# Patient Record
Sex: Female | Born: 1935 | Race: Black or African American | Hispanic: No | Smoking: Former smoker
Health system: Southern US, Community
[De-identification: ages and names within clinical notes are randomized; demographics above are authoritative.]

## PROBLEM LIST (undated history)

## (undated) DIAGNOSIS — I4891 Unspecified atrial fibrillation: Secondary | ICD-10-CM

## (undated) DIAGNOSIS — I10 Essential (primary) hypertension: Secondary | ICD-10-CM

## (undated) DIAGNOSIS — I2510 Atherosclerotic heart disease of native coronary artery without angina pectoris: Secondary | ICD-10-CM

## (undated) HISTORY — PX: TONSILLECTOMY: SUR1361

## (undated) HISTORY — PX: PERCUTANEOUS PLACEMENT INTRAVASCULAR STENT CERVICAL CAROTID ARTERY: SUR1019

## (undated) HISTORY — PX: ABDOMINAL HYSTERECTOMY: SHX81

## (undated) HISTORY — PX: JOINT REPLACEMENT: SHX530

## (undated) HISTORY — PX: HAND TENDON SURGERY: SHX663

---

## 2015-12-06 ENCOUNTER — Emergency Department (HOSPITAL_BASED_OUTPATIENT_CLINIC_OR_DEPARTMENT_OTHER): Payer: Medicare HMO

## 2015-12-06 ENCOUNTER — Emergency Department (HOSPITAL_BASED_OUTPATIENT_CLINIC_OR_DEPARTMENT_OTHER)
Admission: EM | Admit: 2015-12-06 | Discharge: 2015-12-06 | Disposition: A | Discharge status: Other Acute Inpatient Hospital | Payer: Medicare HMO | Attending: Emergency Medicine | Admitting: Emergency Medicine

## 2015-12-06 ENCOUNTER — Encounter (HOSPITAL_BASED_OUTPATIENT_CLINIC_OR_DEPARTMENT_OTHER): Payer: Self-pay | Admitting: *Deleted

## 2015-12-06 DIAGNOSIS — Y998 Other external cause status: Secondary | ICD-10-CM | POA: Diagnosis not present

## 2015-12-06 DIAGNOSIS — K625 Hemorrhage of anus and rectum: Secondary | ICD-10-CM

## 2015-12-06 DIAGNOSIS — I10 Essential (primary) hypertension: Secondary | ICD-10-CM | POA: Insufficient documentation

## 2015-12-06 DIAGNOSIS — Y9389 Activity, other specified: Secondary | ICD-10-CM | POA: Diagnosis not present

## 2015-12-06 DIAGNOSIS — R195 Other fecal abnormalities: Secondary | ICD-10-CM | POA: Diagnosis not present

## 2015-12-06 DIAGNOSIS — Z79899 Other long term (current) drug therapy: Secondary | ICD-10-CM | POA: Insufficient documentation

## 2015-12-06 DIAGNOSIS — S6991XA Unspecified injury of right wrist, hand and finger(s), initial encounter: Secondary | ICD-10-CM | POA: Insufficient documentation

## 2015-12-06 DIAGNOSIS — Y92007 Garden or yard of unspecified non-institutional (private) residence as the place of occurrence of the external cause: Secondary | ICD-10-CM | POA: Diagnosis not present

## 2015-12-06 DIAGNOSIS — Z791 Long term (current) use of non-steroidal anti-inflammatories (NSAID): Secondary | ICD-10-CM | POA: Insufficient documentation

## 2015-12-06 DIAGNOSIS — W1839XA Other fall on same level, initial encounter: Secondary | ICD-10-CM | POA: Insufficient documentation

## 2015-12-06 HISTORY — DX: Essential (primary) hypertension: I10

## 2015-12-06 LAB — COMPREHENSIVE METABOLIC PANEL
ALBUMIN: 4.4 g/dL (ref 3.5–5.0)
ALT: 25 U/L (ref 14–54)
ANION GAP: 7 (ref 5–15)
AST: 30 U/L (ref 15–41)
Alkaline Phosphatase: 81 U/L (ref 38–126)
BUN: 18 mg/dL (ref 6–20)
CO2: 23 mmol/L (ref 22–32)
Calcium: 9 mg/dL (ref 8.9–10.3)
Chloride: 111 mmol/L (ref 101–111)
Creatinine, Ser: 0.95 mg/dL (ref 0.44–1.00)
GFR calc Af Amer: >60 mL/min (ref >60)
GFR calc non Af Amer: 55 mL/min — ABNORMAL LOW (ref >60)
GLUCOSE: 91 mg/dL (ref 65–99)
POTASSIUM: 4.1 mmol/L (ref 3.5–5.1)
SODIUM: 141 mmol/L (ref 135–145)
Total Bilirubin: 0.6 mg/dL (ref 0.3–1.2)
Total Protein: 7.1 g/dL (ref 6.5–8.1)

## 2015-12-06 LAB — CBC WITH DIFFERENTIAL/PLATELET
BASOS ABS: 0 10*3/uL (ref 0.0–0.1)
BASOS PCT: 0 %
EOS ABS: 0.1 10*3/uL (ref 0.0–0.7)
Eosinophils Relative: 2 %
HCT: 37.4 % (ref 36.0–46.0)
HEMOGLOBIN: 11.9 g/dL — AB (ref 12.0–15.0)
Lymphocytes Relative: 13 %
Lymphs Abs: 0.9 10*3/uL (ref 0.7–4.0)
MCH: 27.6 pg (ref 26.0–34.0)
MCHC: 31.8 g/dL (ref 30.0–36.0)
MCV: 86.8 fL (ref 78.0–100.0)
MONO ABS: 0.6 10*3/uL (ref 0.1–1.0)
MONOS PCT: 9 %
NEUTROS PCT: 76 %
Neutro Abs: 4.9 10*3/uL (ref 1.7–7.7)
Platelets: 195 10*3/uL (ref 150–400)
RBC: 4.31 MIL/uL (ref 3.87–5.11)
RDW: 15.3 % (ref 11.5–15.5)
WBC: 6.5 10*3/uL (ref 4.0–10.5)

## 2015-12-06 LAB — PROTIME-INR
INR: 1.22 (ref 0.00–1.49)
Prothrombin Time: 15.6 seconds — ABNORMAL HIGH (ref 11.6–15.2)

## 2015-12-06 MED ORDER — PANTOPRAZOLE SODIUM 40 MG IV SOLR
40.0000 mg | Freq: Once | INTRAVENOUS | Status: AC
  Administered 2015-12-06: 40 mg via INTRAVENOUS
  Filled 2015-12-06: qty 40

## 2015-12-06 MED ORDER — SODIUM CHLORIDE 0.9 % IV SOLN
20.0000 mL | INTRAVENOUS | Status: DC
  Administered 2015-12-06: 20 mL via INTRAVENOUS

## 2015-12-06 NOTE — ED Notes (Signed)
Pt. Reports she fell on Tuesday causing injury to the R hand.  Noted swelling to the R hand and R wrist.  Pt. Reports she saw her PMD and was told she may have a Fx.  Was told she needs to follow up with a Ortho and has not been referred.  Pt. Here today due to family told her to come and she also took  NSAID and "passed blood" Pt. Is on blood thinner Plavix for approx. 10 yrs. For carotid artery disease.

## 2015-12-06 NOTE — ED Notes (Signed)
Dr. Jeanell Sparrow did rectal check and on Pt. RN Rosana Hoes at bedside..noted frank blood on Dr. Jeanell Sparrow glove.  Pt. Did report she had much blood with a BM this morning.

## 2015-12-06 NOTE — ED Provider Notes (Signed)
CSN: [id number]     Arrival date & time 12/06/15  1453 History   First MD Initiated Contact with Patient 12/06/15 1514     Chief Complaint  Patient presents with  . Hand Injury     (Consider location/radiation/quality/duration/timing/severity/associated sxs/prior Treatment) HPI This is an 81 year old female comes in today complaining of right wrist pain. She fell 3 days ago while she was outside working in the yard. She had some pain and swelling at the wrist and was seen by her primary care physician in next day. A wrist x-Maple Odaniel was obtained. She was called back the next day and told that it might be broken and they will call her with a referral to hand surgeon. She states that she not get a call back from them and came in today at the urging of family. She is having some pain and swelling in the area. She denies any numbness or tingling to the fingers or hand. She is able to use the hand and it is her dominant hand.  She also states that she has noted bright red blood in her stool with each bowel movement 2 times during the past 3 days. She states that today it was chiefly blood one time. She has not noted any lightheadedness, chest pain, or dyspnea. She has no previous history of rectal bleeding. She is on Plavix. She took nonsteroidals for the pain in her hand. Past Medical History  Diagnosis Date  . Hypertension    Past Surgical History  Procedure Laterality Date  . Percutaneous placement intravascular stent cervical carotid artery    . Joint replacement      R hip x 2  . Abdominal hysterectomy    . Tonsillectomy    . Hand tendon surgery     No family history on file. Social History  Substance Use Topics  . Smoking status: Not on file  . Smokeless tobacco: Not on file  . Alcohol Use: Not on file   OB History    No data available     Review of Systems  All other systems reviewed and are negative.     Allergies  Review of patient's allergies indicates no known  allergies.  Home Medications   Prior to Admission medications   Medication Sig Start Date End Date Taking? Authorizing Provider  atorvastatin (LIPITOR) 40 MG tablet Take 40 mg by mouth daily.   Yes Historical Provider, MD  bimatoprost (LATISSE) 0.03 % ophthalmic solution Place into both eyes at bedtime. Place one drop on applicator and apply evenly along the skin of the upper eyelid at base of eyelashes once daily at bedtime; repeat procedure for second eye (use a clean applicator).   Yes Historical Provider, MD  calcium-vitamin D (OSCAL WITH D) 500-200 MG-UNIT tablet Take 1 tablet by mouth.   Yes Historical Provider, MD  clopidogrel (PLAVIX) 75 MG tablet Take 75 mg by mouth daily.   Yes Historical Provider, MD  ezetimibe (ZETIA) 10 MG tablet Take 10 mg by mouth daily.   Yes Historical Provider, MD  ferrous sulfate 325 (65 FE) MG tablet Take 325 mg by mouth daily with breakfast.   Yes Historical Provider, MD  folic acid (FOLVITE) 1 MG tablet Take 1 mg by mouth daily.   Yes Historical Provider, MD  lisinopril (PRINIVIL,ZESTRIL) 20 MG tablet Take 20 mg by mouth daily.   Yes Historical Provider, MD  omega-3 acid ethyl esters (LOVAZA) 1 g capsule Take by mouth 2 (two) times daily.  Yes Historical Provider, MD  risedronate (ACTONEL) 35 MG tablet Take 35 mg by mouth every 7 (seven) days. with water on empty stomach, nothing by mouth or lie down for next 30 minutes.   Yes Historical Provider, MD  topiramate (TOPAMAX) 50 MG tablet Take 50 mg by mouth 2 (two) times daily.   Yes Historical Provider, MD   BP 190/94 mmHg  Pulse 81  Temp(Src) 98.2 F (36.8 C) (Oral)  Resp 18  Ht 5\' 8"  (1.727 m)  Wt 60.782 kg  BMI 20.38 kg/m2  SpO2 98% Physical Exam  Constitutional: She is oriented to person, place, and time. She appears well-developed and well-nourished.  HENT:  Head: Normocephalic and atraumatic.  Right Ear: External ear normal.  Left Ear: External ear normal.  Nose: Nose normal.    Mouth/Throat: Oropharynx is clear and moist.  Eyes: Conjunctivae and EOM are normal. Pupils are equal, round, and reactive to light.  Neck: Normal range of motion. Neck supple.  Cardiovascular: Normal rate, regular rhythm, normal heart sounds and intact distal pulses.   Pulmonary/Chest: Effort normal and breath sounds normal.  Abdominal: Soft. Bowel sounds are normal.  Genitourinary: Guaiac positive stool.  Digital rectal exam reveals maroon stool  Musculoskeletal: She exhibits tenderness.       Arms: Tenderness over distal right radius. Skin is intact. 2 point sensation in fingers is intact. Movement is intact in all her fingers, wrist, and elbow.  Neurological: She is alert and oriented to person, place, and time.  Skin: Skin is warm and dry.  Psychiatric: She has a normal mood and affect.  Nursing note and vitals reviewed.   ED Course  Procedures (including critical care time) Labs Review Labs Reviewed  CBC WITH DIFFERENTIAL/PLATELET - Abnormal; Notable for the following:    Hemoglobin 11.9 (*)    All other components within normal limits  COMPREHENSIVE METABOLIC PANEL - Abnormal; Notable for the following:    GFR calc non Af Amer 55 (*)    All other components within normal limits  PROTIME-INR - Abnormal; Notable for the following:    Prothrombin Time 15.6 (*)    All other components within normal limits    Imaging Review Dg Chest 2 View  12/06/2015  CLINICAL DATA:  Rectal bleeding for day. EXAM: CHEST  2 VIEW COMPARISON:  None. FINDINGS: Heart is enlarged, mild to moderate in degree. Lungs are hyperexpanded suggesting COPD. Lungs are clear. No evidence of pneumonia. No pleural effusion. No pneumothorax. Osseous structures about the chest are unremarkable. IMPRESSION: Cardiomegaly. Lungs are hyperexpanded suggesting COPD. No evidence of acute cardiopulmonary abnormality. Electronically Signed   By: Franki Cabot M.D.   On: 12/06/2015 16:29   Dg Wrist Complete  Right  12/06/2015  CLINICAL DATA:  Fall 3 days ago, right wrist pain. EXAM: RIGHT WRIST - COMPLETE 3+ VIEW COMPARISON:  None. FINDINGS: Osseous structures are diffusely osteopenic which limits characterization of osseous detail. Subtle sclerotic focus within the distal right radius, distal metaphysis, is suspicious for nondisplaced fracture. There is neutral relationship at the radiocarpal joint space. Mild degenerative changes seen amongst the carpal bones and at the first metacarpophalangeal joint space. IMPRESSION: 1. Vague linear sclerotic focus within the distal right radial metaphysis suspicious for nondisplaced fracture, but not convincing. Alternatively, this could represent sclerosis related to old fracture or old bone infarct. 2.  No displaced fracture identified. 3.  Osteopenia. Electronically Signed   By: Franki Cabot M.D.   On: 12/06/2015 16:34   I have personally  reviewed and evaluated these images and lab results as part of my medical decision-making.   EKG Interpretation   Date/Time:  Friday December 06 2015 15:42:34 EST Ventricular Rate:  77 PR Interval:  129 QRS Duration: 82 QT Interval:  403 QTC Calculation: 456 R Axis:   80 Text Interpretation:  Sinus rhythm Probable left atrial enlargement LVH  with secondary repolarization abnormality Confirmed by Alycen Mack MD, Andee Poles  [id number]) on 12/06/2015 4:04:17 PM      MDM   Final diagnoses:  Rectal bleeding      1 rectal bleeding patient with gross blood on rectal exam. She has received Protonix here. Hemoglobin is 11.9. She is hemodynamically stable 2 distal right radius fracture patient is placed in Velcro splint. Plan transfer [facility]. Patient's care discussed with Dr. Verl Blalock. Pattricia Boss, MD 12/06/15 [phone]

## 2020-07-25 ENCOUNTER — Other Ambulatory Visit: Payer: Self-pay

## 2020-07-25 ENCOUNTER — Emergency Department (HOSPITAL_BASED_OUTPATIENT_CLINIC_OR_DEPARTMENT_OTHER)
Admission: EM | Admit: 2020-07-25 | Discharge: 2020-07-25 | Disposition: A | Discharge status: Home / Self Care | Payer: Medicare HMO | Source: Home / Self Care | Attending: Emergency Medicine | Admitting: Emergency Medicine

## 2020-07-25 ENCOUNTER — Emergency Department (HOSPITAL_BASED_OUTPATIENT_CLINIC_OR_DEPARTMENT_OTHER): Payer: Medicare HMO

## 2020-07-25 ENCOUNTER — Encounter (HOSPITAL_BASED_OUTPATIENT_CLINIC_OR_DEPARTMENT_OTHER): Payer: Self-pay | Admitting: *Deleted

## 2020-07-25 DIAGNOSIS — Z79899 Other long term (current) drug therapy: Secondary | ICD-10-CM | POA: Insufficient documentation

## 2020-07-25 DIAGNOSIS — I10 Essential (primary) hypertension: Secondary | ICD-10-CM | POA: Insufficient documentation

## 2020-07-25 DIAGNOSIS — Z96651 Presence of right artificial knee joint: Secondary | ICD-10-CM | POA: Insufficient documentation

## 2020-07-25 DIAGNOSIS — Z20822 Contact with and (suspected) exposure to covid-19: Secondary | ICD-10-CM | POA: Insufficient documentation

## 2020-07-25 DIAGNOSIS — J9601 Acute respiratory failure with hypoxia: Secondary | ICD-10-CM | POA: Diagnosis not present

## 2020-07-25 DIAGNOSIS — R079 Chest pain, unspecified: Secondary | ICD-10-CM | POA: Insufficient documentation

## 2020-07-25 DIAGNOSIS — R0602 Shortness of breath: Secondary | ICD-10-CM | POA: Insufficient documentation

## 2020-07-25 DIAGNOSIS — Z87891 Personal history of nicotine dependence: Secondary | ICD-10-CM | POA: Insufficient documentation

## 2020-07-25 LAB — CBC
HCT: 33.6 % — ABNORMAL LOW (ref 36.0–46.0)
Hemoglobin: 10.7 g/dL — ABNORMAL LOW (ref 12.0–15.0)
MCH: 28.2 pg (ref 26.0–34.0)
MCHC: 31.8 g/dL (ref 30.0–36.0)
MCV: 88.7 fL (ref 80.0–100.0)
Platelets: 231 10*3/uL (ref 150–400)
RBC: 3.79 MIL/uL — ABNORMAL LOW (ref 3.87–5.11)
RDW: 16.4 % — ABNORMAL HIGH (ref 11.5–15.5)
WBC: 6.4 10*3/uL (ref 4.0–10.5)
nRBC: 0.3 % — ABNORMAL HIGH (ref 0.0–0.2)

## 2020-07-25 LAB — HEPATIC FUNCTION PANEL
ALT: 44 U/L (ref 0–44)
AST: 53 U/L — ABNORMAL HIGH (ref 15–41)
Albumin: 4 g/dL (ref 3.5–5.0)
Alkaline Phosphatase: 126 U/L (ref 38–126)
Bilirubin, Direct: 0.4 mg/dL — ABNORMAL HIGH (ref 0.0–0.2)
Indirect Bilirubin: 0.7 mg/dL (ref 0.3–0.9)
Total Bilirubin: 1.1 mg/dL (ref 0.3–1.2)
Total Protein: 6.9 g/dL (ref 6.5–8.1)

## 2020-07-25 LAB — BASIC METABOLIC PANEL
Anion gap: 14 (ref 5–15)
BUN: 42 mg/dL — ABNORMAL HIGH (ref 8–23)
CO2: 26 mmol/L (ref 22–32)
Calcium: 9.7 mg/dL (ref 8.9–10.3)
Chloride: 100 mmol/L (ref 98–111)
Creatinine, Ser: 1.64 mg/dL — ABNORMAL HIGH (ref 0.44–1.00)
GFR calc Af Amer: 33 mL/min — ABNORMAL LOW (ref >60)
GFR calc non Af Amer: 28 mL/min — ABNORMAL LOW (ref >60)
Glucose, Bld: 122 mg/dL — ABNORMAL HIGH (ref 70–99)
Potassium: 5.2 mmol/L — ABNORMAL HIGH (ref 3.5–5.1)
Sodium: 140 mmol/L (ref 135–145)

## 2020-07-25 LAB — BRAIN NATRIURETIC PEPTIDE: B Natriuretic Peptide: 2255.6 pg/mL — ABNORMAL HIGH (ref 0.0–100.0)

## 2020-07-25 LAB — SARS CORONAVIRUS 2 BY RT PCR (HOSPITAL ORDER, PERFORMED IN ~~LOC~~ HOSPITAL LAB): SARS Coronavirus 2: NEGATIVE

## 2020-07-25 LAB — TROPONIN I (HIGH SENSITIVITY)
Troponin I (High Sensitivity): 29 ng/L — ABNORMAL HIGH (ref <18)
Troponin I (High Sensitivity): 26 ng/L — ABNORMAL HIGH (ref <18)

## 2020-07-25 MED ORDER — FUROSEMIDE 10 MG/ML IJ SOLN
20.0000 mg | Freq: Once | INTRAMUSCULAR | Status: AC
  Administered 2020-07-25: 20 mg via INTRAVENOUS
  Filled 2020-07-25: qty 2

## 2020-07-25 NOTE — ED Provider Notes (Signed)
Blood pressure 103/79, pulse 99, temperature 97.6 F (36.4 C), temperature source Oral, resp. rate (!) 25, height 5\' 8"  (1.727 m), weight 48.5 kg, SpO2 97 %.  Assuming care from Dr. Rogene Houston.  In short, Shawna Curry is a 86 y.o. female with a chief complaint of Shortness of Breath and Chest Pain .  Refer to the original H&P for additional details.  The current plan of care is to f/u on CT chest and reassess after repeat troponin. Patient with minimal O2 requirement and pulse ox picking up poorly.   04:30 PM  Patient CT scan shows small pleural effusion, question of atelectasis, no significant pulmonary edema on CT.  I suspect that the area of atelectasis is that and not a developing infiltrate.  Patient has no pneumonia symptoms at this time.  Had discussion with the patient and son at bedside.  The patient is feeling well and would prefer to go home and follow with her cardiologist on Monday as scheduled.  Will give a small extra dose of Lasix here and ambulate to see if the oxygen is actually required or can be removed.   05:00 PM  Patient ambulated in the emergency department with her walker without supplemental oxygen.  No significant desaturation, increased work of breathing.  Subjectively the patient is feeling well.  We discussed her options and she would prefer to try management at home.  She has follow-up with her cardiologist on Monday.  We discussed strict ED return precautions with both the patient and son at bedside.     Margette Fast, MD 07/25/20 [phone]

## 2020-07-25 NOTE — ED Notes (Signed)
DR Z into speak to pt and son about chest xray

## 2020-07-25 NOTE — ED Notes (Signed)
Crystal RT did EKG

## 2020-07-25 NOTE — ED Notes (Signed)
Sob and weakness for a while  Since  April  . Started to feel  Worse about 11 am ,  Saw her cards dr on tuesday

## 2020-07-25 NOTE — Discharge Instructions (Signed)
You were seen in the emergency department today with shortness of breath symptoms.  Your lab work showed some elevation in your fluid levels but your CT scan did not show significant fluid in your lungs.  Please continue your Lasix.  We gave you an extra dose here but she should continue your normal dose of Lasix over the weekend.  Please keep your appointment on Monday with your cardiologist.  If your symptoms change or suddenly worsen you should return to the emergency department or call 911 if severe.

## 2020-07-25 NOTE — ED Triage Notes (Signed)
Sob and pain in her right chest this am. States she was admitted for same recently.

## 2020-07-25 NOTE — ED Notes (Signed)
Patient transported to CT 

## 2020-07-25 NOTE — ED Notes (Signed)
Ambulated pt with pulse ox, started at 100%, started walking dropped to 95%.  He had frequent times where her pulse oximeter would not read, but when it came back on, got down to 93-94% for a couple of seconds when returning to room, then increased to 98-100% upon sitting on bed. Pt. "Felt good walking".

## 2020-07-25 NOTE — ED Provider Notes (Signed)
[facility] EMERGENCY DEPARTMENT Provider Note   CSN: [id number] Arrival date & time: 07/25/20  1202     History Chief Complaint  Patient presents with  . Shortness of Breath  . Chest Pain    Shawna Curry is a 86 y.o. female.  Patient followed by [facility] cardiology group. Known to have a history of coronary disease with prior stent atrial fibrillation. But not on Plavix and Eliquis. Patient known to have heart failure but preserved ventricular function according to their notes. History of shortness of breath chronically and hyperlipidemia. She is on Lasix 40 mg a day. She did take it this morning. Patient was seen by the cardiology group on August 17 just 2 days ago. And also with a complaint here today of some shortness of breath and some chest pain which seems somewhat vague. Currently not there now.        Past Medical History:  Diagnosis Date  . Hypertension     There are no problems to display for this patient.   Past Surgical History:  Procedure Laterality Date  . ABDOMINAL HYSTERECTOMY    . HAND TENDON SURGERY    . JOINT REPLACEMENT     R hip x 2  . PERCUTANEOUS PLACEMENT INTRAVASCULAR STENT CERVICAL CAROTID ARTERY    . TONSILLECTOMY       OB History   No obstetric history on file.     No family history on file.  Social History   Tobacco Use  . Smoking status: Former Research scientist (life sciences)  . Smokeless tobacco: Never Used  Substance Use Topics  . Alcohol use: Never  . Drug use: Never    Home Medications Prior to Admission medications   Medication Sig Start Date End Date Taking? Authorizing Provider  atorvastatin (LIPITOR) 40 MG tablet Take 40 mg by mouth daily.    [provider]  bimatoprost (LATISSE) 0.03 % ophthalmic solution Place into both eyes at bedtime. Place one drop on applicator and apply evenly along the skin of the upper eyelid at base of eyelashes once daily at bedtime; repeat procedure for second eye (use a clean applicator).     [provider]  calcium-vitamin D (OSCAL WITH D) 500-200 MG-UNIT tablet Take 1 tablet by mouth.    [provider]  clopidogrel (PLAVIX) 75 MG tablet Take 75 mg by mouth daily.    [provider]  ezetimibe (ZETIA) 10 MG tablet Take 10 mg by mouth daily.    [provider]  ferrous sulfate 325 (65 FE) MG tablet Take 325 mg by mouth daily with breakfast.    [provider]  folic acid (FOLVITE) 1 MG tablet Take 1 mg by mouth daily.    [provider]  lisinopril (PRINIVIL,ZESTRIL) 20 MG tablet Take 20 mg by mouth daily.    [provider]  omega-3 acid ethyl esters (LOVAZA) 1 g capsule Take by mouth 2 (two) times daily.    [provider]  risedronate (ACTONEL) 35 MG tablet Take 35 mg by mouth every 7 (seven) days. with water on empty stomach, nothing by mouth or lie down for next 30 minutes.    [provider]  topiramate (TOPAMAX) 50 MG tablet Take 50 mg by mouth 2 (two) times daily.    [provider]    Allergies    Patient has no known allergies.  Review of Systems   Review of Systems  Constitutional: Negative for chills and fever.  HENT: Negative for congestion,  rhinorrhea and sore throat.   Eyes: Negative for visual disturbance.  Respiratory: Positive for shortness of breath. Negative for cough.   Cardiovascular: Positive for chest pain. Negative for leg swelling.  Gastrointestinal: Negative for abdominal pain, diarrhea, nausea and vomiting.  Genitourinary: Negative for dysuria.  Musculoskeletal: Negative for back pain and neck pain.  Skin: Negative for rash.  Neurological: Negative for dizziness, light-headedness and headaches.  Hematological: Does not bruise/bleed easily.  Psychiatric/Behavioral: Negative for confusion.    Physical Exam Updated Vital Signs BP 107/88 (BP Location: Right Arm)   Pulse 86   Temp 97.6 F (36.4 C) (Oral)   Resp (!) 23   Ht 1.727 m (5\' 8" )   Wt 48.5  kg   SpO2 99%   BMI 16.27 kg/m   Physical Exam Vitals and nursing note reviewed.  Constitutional:      General: She is not in acute distress.    Appearance: Normal appearance. She is well-developed. She is not toxic-appearing or diaphoretic.     Comments: Patient is very thin.  HENT:     Head: Normocephalic and atraumatic.  Eyes:     Conjunctiva/sclera: Conjunctivae normal.     Pupils: Pupils are equal, round, and reactive to light.  Cardiovascular:     Rate and Rhythm: Normal rate and regular rhythm.     Heart sounds: No murmur heard.   Pulmonary:     Effort: Pulmonary effort is normal. No respiratory distress.     Breath sounds: Normal breath sounds.  Abdominal:     Palpations: Abdomen is soft.     Tenderness: There is no abdominal tenderness.  Musculoskeletal:        General: Swelling present.     Cervical back: Neck supple.     Comments: Very trace edema to lower extremities.  Skin:    General: Skin is warm and dry.     Capillary Refill: Capillary refill takes less than 2 seconds.  Neurological:     General: No focal deficit present.     Mental Status: She is alert. Mental status is at baseline.     Cranial Nerves: No cranial nerve deficit.     Sensory: No sensory deficit.     Motor: No weakness.     ED Results / Procedures / Treatments   Labs (all labs ordered are listed, but only abnormal results are displayed) Labs Reviewed  BASIC METABOLIC PANEL - Abnormal; Notable for the following components:      Result Value   Potassium 5.2 (*)    Glucose, Bld 122 (*)    BUN 42 (*)    Creatinine, Ser 1.64 (*)    GFR calc non Af Amer 28 (*)    GFR calc Af Amer 33 (*)    All other components within normal limits  CBC - Abnormal; Notable for the following components:   RBC 3.79 (*)    Hemoglobin 10.7 (*)    HCT 33.6 (*)    RDW 16.4 (*)    nRBC 0.3 (*)    All other components within normal limits  HEPATIC FUNCTION PANEL - Abnormal; Notable for the following  components:   AST 53 (*)    Bilirubin, Direct 0.4 (*)    All other components within normal limits  BRAIN NATRIURETIC PEPTIDE - Abnormal; Notable for the following components:   B Natriuretic Peptide 2,255.6 (*)    All other components within normal limits  TROPONIN I (HIGH SENSITIVITY) - Abnormal; Notable for the following components:  Troponin I (High Sensitivity) 29 (*)    All other components within normal limits  SARS CORONAVIRUS 2 BY RT PCR [facility] ORDER, [place] [facility])  TROPONIN I (HIGH SENSITIVITY)    EKG EKG Interpretation  Date/Time:  Thursday July 25 2020 12:51:39 EDT Ventricular Rate:  102 PR Interval:    QRS Duration: 88 QT Interval:  396 QTC Calculation: 477 R Axis:   94 Text Interpretation: Atrial fibrillation Right axis deviation Borderline low voltage, extremity leads Borderline ST depression, diffuse leads Confirmed by Fredia Sorrow [phone]) on 07/25/2020 3:09:52 PM   Radiology DG Chest 2 View  Result Date: 07/25/2020 CLINICAL DATA:  Shortness of breath.  Right chest pain. EXAM: CHEST - 2 VIEW COMPARISON:  07/10/2020 FINDINGS: Enlarged cardiac silhouette. Aortic atherosclerotic calcification. Pulmonary venous hypertension without frank edema. Small effusions layering dependently, larger than were seen 2 weeks ago. Ordinary chronic degenerative changes affect the shoulders. IMPRESSION: Enlarged cardiac silhouette. Bilateral pleural effusions in the dependent pleural space, right larger than left, and slightly larger than 2 weeks ago. Electronically Signed   By: Nelson Chimes M.D.   On: 07/25/2020 12:29    Procedures Procedures (including critical care time)  Medications Ordered in ED Medications - No data to display  ED Course  I have reviewed the triage vital signs and the nursing notes.  Pertinent labs & imaging results that were available during my care of the patient were reviewed by me and considered in my medical decision  making (see chart for details).    MDM Rules/Calculators/A&P                           Patient started on 2 L of oxygen by the nurses. But her oxygen sats have been 100% on that when she gets good pickup. Patient's blood pressure was 103/74. Heart rate 74. EKG showed evidence of atrial fibrillation but was rate controlled. Chest x-ray raise concerns for bilateral pleural effusions right greater than left slightly larger than 2 weeks ago which implies she did have some in the past. No evidence of pulmonary edema however.   Patient here is very comfortable at rest.  BNP was markedly elevated. Troponin slightly renal function a little worse than baseline but we did not have anything for recent comparison.  She will have repeat troponin will have CT chest without contrast due to her renal function to further define whether there is pulmonary edema and then also the pleural effusions.   Anything is abnormal will require admission if everything is normal patient may need off oxygen.  Covid testing was ordered and is negative.   Patient turned over to evening emergency physician who will follow up.   Final Clinical Impression(s) / ED Diagnoses Final diagnoses:  Shortness of breath  Chest pain, unspecified type    Rx / DC Orders ED Discharge Orders    None       Fredia Sorrow, MD 07/25/20 1545

## 2020-07-27 ENCOUNTER — Inpatient Hospital Stay (HOSPITAL_BASED_OUTPATIENT_CLINIC_OR_DEPARTMENT_OTHER)
Admission: EM | Admit: 2020-07-27 | Discharge: 2020-08-02 | DRG: 208 | Disposition: A | Discharge status: Rehabilitation Facility | Payer: Medicare HMO | Attending: Internal Medicine | Admitting: Internal Medicine

## 2020-07-27 ENCOUNTER — Other Ambulatory Visit: Payer: Self-pay

## 2020-07-27 ENCOUNTER — Emergency Department (HOSPITAL_BASED_OUTPATIENT_CLINIC_OR_DEPARTMENT_OTHER): Payer: Medicare HMO

## 2020-07-27 ENCOUNTER — Inpatient Hospital Stay (HOSPITAL_COMMUNITY): Payer: Medicare HMO

## 2020-07-27 ENCOUNTER — Other Ambulatory Visit (HOSPITAL_BASED_OUTPATIENT_CLINIC_OR_DEPARTMENT_OTHER): Payer: Self-pay

## 2020-07-27 ENCOUNTER — Encounter (HOSPITAL_BASED_OUTPATIENT_CLINIC_OR_DEPARTMENT_OTHER): Payer: Self-pay | Admitting: Emergency Medicine

## 2020-07-27 DIAGNOSIS — I69351 Hemiplegia and hemiparesis following cerebral infarction affecting right dominant side: Secondary | ICD-10-CM | POA: Diagnosis not present

## 2020-07-27 DIAGNOSIS — I130 Hypertensive heart and chronic kidney disease with heart failure and stage 1 through stage 4 chronic kidney disease, or unspecified chronic kidney disease: Secondary | ICD-10-CM | POA: Diagnosis present

## 2020-07-27 DIAGNOSIS — N179 Acute kidney failure, unspecified: Secondary | ICD-10-CM | POA: Diagnosis present

## 2020-07-27 DIAGNOSIS — N184 Chronic kidney disease, stage 4 (severe): Secondary | ICD-10-CM | POA: Diagnosis present

## 2020-07-27 DIAGNOSIS — D649 Anemia, unspecified: Secondary | ICD-10-CM | POA: Diagnosis not present

## 2020-07-27 DIAGNOSIS — Z79899 Other long term (current) drug therapy: Secondary | ICD-10-CM | POA: Diagnosis not present

## 2020-07-27 DIAGNOSIS — E785 Hyperlipidemia, unspecified: Secondary | ICD-10-CM

## 2020-07-27 DIAGNOSIS — I468 Cardiac arrest due to other underlying condition: Secondary | ICD-10-CM | POA: Diagnosis present

## 2020-07-27 DIAGNOSIS — G8191 Hemiplegia, unspecified affecting right dominant side: Secondary | ICD-10-CM

## 2020-07-27 DIAGNOSIS — D72825 Bandemia: Secondary | ICD-10-CM | POA: Diagnosis not present

## 2020-07-27 DIAGNOSIS — E1165 Type 2 diabetes mellitus with hyperglycemia: Secondary | ICD-10-CM | POA: Diagnosis present

## 2020-07-27 DIAGNOSIS — I5033 Acute on chronic diastolic (congestive) heart failure: Secondary | ICD-10-CM | POA: Diagnosis present

## 2020-07-27 DIAGNOSIS — Z9911 Dependence on respirator [ventilator] status: Secondary | ICD-10-CM | POA: Diagnosis not present

## 2020-07-27 DIAGNOSIS — G9340 Encephalopathy, unspecified: Secondary | ICD-10-CM | POA: Diagnosis not present

## 2020-07-27 DIAGNOSIS — Z7984 Long term (current) use of oral hypoglycemic drugs: Secondary | ICD-10-CM

## 2020-07-27 DIAGNOSIS — I6330 Cerebral infarction due to thrombosis of unspecified cerebral artery: Secondary | ICD-10-CM | POA: Diagnosis not present

## 2020-07-27 DIAGNOSIS — R0602 Shortness of breath: Secondary | ICD-10-CM | POA: Diagnosis present

## 2020-07-27 DIAGNOSIS — R34 Anuria and oliguria: Secondary | ICD-10-CM | POA: Diagnosis present

## 2020-07-27 DIAGNOSIS — I469 Cardiac arrest, cause unspecified: Secondary | ICD-10-CM | POA: Diagnosis not present

## 2020-07-27 DIAGNOSIS — R5381 Other malaise: Secondary | ICD-10-CM

## 2020-07-27 DIAGNOSIS — I4891 Unspecified atrial fibrillation: Secondary | ICD-10-CM | POA: Diagnosis not present

## 2020-07-27 DIAGNOSIS — J9690 Respiratory failure, unspecified, unspecified whether with hypoxia or hypercapnia: Secondary | ICD-10-CM | POA: Diagnosis present

## 2020-07-27 DIAGNOSIS — E872 Acidosis: Secondary | ICD-10-CM | POA: Diagnosis present

## 2020-07-27 DIAGNOSIS — R6520 Severe sepsis without septic shock: Secondary | ICD-10-CM

## 2020-07-27 DIAGNOSIS — I6381 Other cerebral infarction due to occlusion or stenosis of small artery: Secondary | ICD-10-CM | POA: Diagnosis not present

## 2020-07-27 DIAGNOSIS — I480 Paroxysmal atrial fibrillation: Secondary | ICD-10-CM | POA: Diagnosis not present

## 2020-07-27 DIAGNOSIS — Z7902 Long term (current) use of antithrombotics/antiplatelets: Secondary | ICD-10-CM

## 2020-07-27 DIAGNOSIS — G9341 Metabolic encephalopathy: Secondary | ICD-10-CM | POA: Diagnosis present

## 2020-07-27 DIAGNOSIS — Z20822 Contact with and (suspected) exposure to covid-19: Secondary | ICD-10-CM | POA: Diagnosis present

## 2020-07-27 DIAGNOSIS — Z955 Presence of coronary angioplasty implant and graft: Secondary | ICD-10-CM

## 2020-07-27 DIAGNOSIS — E875 Hyperkalemia: Secondary | ICD-10-CM | POA: Diagnosis not present

## 2020-07-27 DIAGNOSIS — Z789 Other specified health status: Secondary | ICD-10-CM

## 2020-07-27 DIAGNOSIS — I5021 Acute systolic (congestive) heart failure: Secondary | ICD-10-CM

## 2020-07-27 DIAGNOSIS — I959 Hypotension, unspecified: Secondary | ICD-10-CM | POA: Diagnosis not present

## 2020-07-27 DIAGNOSIS — Z7901 Long term (current) use of anticoagulants: Secondary | ICD-10-CM

## 2020-07-27 DIAGNOSIS — A419 Sepsis, unspecified organism: Secondary | ICD-10-CM | POA: Insufficient documentation

## 2020-07-27 DIAGNOSIS — J9600 Acute respiratory failure, unspecified whether with hypoxia or hypercapnia: Secondary | ICD-10-CM

## 2020-07-27 DIAGNOSIS — J9601 Acute respiratory failure with hypoxia: Secondary | ICD-10-CM | POA: Diagnosis present

## 2020-07-27 DIAGNOSIS — I639 Cerebral infarction, unspecified: Secondary | ICD-10-CM

## 2020-07-27 DIAGNOSIS — E119 Type 2 diabetes mellitus without complications: Secondary | ICD-10-CM | POA: Diagnosis not present

## 2020-07-27 DIAGNOSIS — E1122 Type 2 diabetes mellitus with diabetic chronic kidney disease: Secondary | ICD-10-CM | POA: Diagnosis present

## 2020-07-27 DIAGNOSIS — I4819 Other persistent atrial fibrillation: Secondary | ICD-10-CM | POA: Diagnosis present

## 2020-07-27 DIAGNOSIS — R270 Ataxia, unspecified: Secondary | ICD-10-CM | POA: Diagnosis not present

## 2020-07-27 DIAGNOSIS — I2510 Atherosclerotic heart disease of native coronary artery without angina pectoris: Secondary | ICD-10-CM | POA: Diagnosis present

## 2020-07-27 DIAGNOSIS — R4701 Aphasia: Secondary | ICD-10-CM | POA: Diagnosis present

## 2020-07-27 DIAGNOSIS — D72829 Elevated white blood cell count, unspecified: Secondary | ICD-10-CM | POA: Diagnosis present

## 2020-07-27 DIAGNOSIS — I69391 Dysphagia following cerebral infarction: Secondary | ICD-10-CM | POA: Diagnosis not present

## 2020-07-27 DIAGNOSIS — Z86718 Personal history of other venous thrombosis and embolism: Secondary | ICD-10-CM

## 2020-07-27 DIAGNOSIS — R7309 Other abnormal glucose: Secondary | ICD-10-CM | POA: Diagnosis not present

## 2020-07-27 DIAGNOSIS — Z87891 Personal history of nicotine dependence: Secondary | ICD-10-CM

## 2020-07-27 DIAGNOSIS — L97519 Non-pressure chronic ulcer of other part of right foot with unspecified severity: Secondary | ICD-10-CM | POA: Diagnosis present

## 2020-07-27 DIAGNOSIS — I340 Nonrheumatic mitral (valve) insufficiency: Secondary | ICD-10-CM | POA: Diagnosis not present

## 2020-07-27 DIAGNOSIS — I5023 Acute on chronic systolic (congestive) heart failure: Secondary | ICD-10-CM | POA: Diagnosis not present

## 2020-07-27 DIAGNOSIS — R269 Unspecified abnormalities of gait and mobility: Secondary | ICD-10-CM | POA: Diagnosis not present

## 2020-07-27 DIAGNOSIS — Z7983 Long term (current) use of bisphosphonates: Secondary | ICD-10-CM

## 2020-07-27 DIAGNOSIS — E11621 Type 2 diabetes mellitus with foot ulcer: Secondary | ICD-10-CM | POA: Diagnosis present

## 2020-07-27 DIAGNOSIS — I739 Peripheral vascular disease, unspecified: Secondary | ICD-10-CM | POA: Diagnosis present

## 2020-07-27 HISTORY — DX: Atherosclerotic heart disease of native coronary artery without angina pectoris: I25.10

## 2020-07-27 HISTORY — DX: Unspecified atrial fibrillation: I48.91

## 2020-07-27 LAB — CBC WITH DIFFERENTIAL/PLATELET
Abs Immature Granulocytes: 0.03 10*3/uL (ref 0.00–0.07)
Basophils Absolute: 0 10*3/uL (ref 0.0–0.1)
Basophils Relative: 0 %
Eosinophils Absolute: 0 10*3/uL (ref 0.0–0.5)
Eosinophils Relative: 0 %
HCT: 32.8 % — ABNORMAL LOW (ref 36.0–46.0)
Hemoglobin: 10.3 g/dL — ABNORMAL LOW (ref 12.0–15.0)
Immature Granulocytes: 1 %
Lymphocytes Relative: 10 %
Lymphs Abs: 0.6 10*3/uL — ABNORMAL LOW (ref 0.7–4.0)
MCH: 28 pg (ref 26.0–34.0)
MCHC: 31.4 g/dL (ref 30.0–36.0)
MCV: 89.1 fL (ref 80.0–100.0)
Monocytes Absolute: 0.6 10*3/uL (ref 0.1–1.0)
Monocytes Relative: 10 %
Neutro Abs: 4.8 10*3/uL (ref 1.7–7.7)
Neutrophils Relative %: 79 %
Platelets: 221 10*3/uL (ref 150–400)
RBC: 3.68 MIL/uL — ABNORMAL LOW (ref 3.87–5.11)
RDW: 16.7 % — ABNORMAL HIGH (ref 11.5–15.5)
WBC: 6.1 10*3/uL (ref 4.0–10.5)
nRBC: 0.3 % — ABNORMAL HIGH (ref 0.0–0.2)

## 2020-07-27 LAB — I-STAT ARTERIAL BLOOD GAS, ED
Acid-base deficit: 10 mmol/L — ABNORMAL HIGH (ref 0.0–2.0)
Bicarbonate: 17.3 mmol/L — ABNORMAL LOW (ref 20.0–28.0)
Calcium, Ion: 1.06 mmol/L — ABNORMAL LOW (ref 1.15–1.40)
HCT: 27 % — ABNORMAL LOW (ref 36.0–46.0)
Hemoglobin: 9.2 g/dL — ABNORMAL LOW (ref 12.0–15.0)
O2 Saturation: 99 %
Potassium: 4.5 mmol/L (ref 3.5–5.1)
Sodium: 144 mmol/L (ref 135–145)
TCO2: 19 mmol/L — ABNORMAL LOW (ref 22–32)
pCO2 arterial: 41.1 mmHg (ref 32.0–48.0)
pH, Arterial: 7.233 — ABNORMAL LOW (ref 7.350–7.450)
pO2, Arterial: 142 mmHg — ABNORMAL HIGH (ref 83.0–108.0)

## 2020-07-27 LAB — POCT I-STAT 7, (LYTES, BLD GAS, ICA,H+H)
Acid-base deficit: 7 mmol/L — ABNORMAL HIGH (ref 0.0–2.0)
Bicarbonate: 18.3 mmol/L — ABNORMAL LOW (ref 20.0–28.0)
Calcium, Ion: 1.03 mmol/L — ABNORMAL LOW (ref 1.15–1.40)
HCT: 30 % — ABNORMAL LOW (ref 36.0–46.0)
Hemoglobin: 10.2 g/dL — ABNORMAL LOW (ref 12.0–15.0)
O2 Saturation: 100 %
Patient temperature: 33.8
Potassium: 3.5 mmol/L (ref 3.5–5.1)
Sodium: 141 mmol/L (ref 135–145)
TCO2: 19 mmol/L — ABNORMAL LOW (ref 22–32)
pCO2 arterial: 29.3 mmHg — ABNORMAL LOW (ref 32.0–48.0)
pH, Arterial: 7.389 (ref 7.350–7.450)
pO2, Arterial: 281 mmHg — ABNORMAL HIGH (ref 83.0–108.0)

## 2020-07-27 LAB — BASIC METABOLIC PANEL
Anion gap: 18 — ABNORMAL HIGH (ref 5–15)
BUN: 51 mg/dL — ABNORMAL HIGH (ref 8–23)
CO2: 21 mmol/L — ABNORMAL LOW (ref 22–32)
Calcium: 9.5 mg/dL (ref 8.9–10.3)
Chloride: 98 mmol/L (ref 98–111)
Creatinine, Ser: 1.9 mg/dL — ABNORMAL HIGH (ref 0.44–1.00)
GFR calc Af Amer: 27 mL/min — ABNORMAL LOW (ref >60)
GFR calc non Af Amer: 24 mL/min — ABNORMAL LOW (ref >60)
Glucose, Bld: 114 mg/dL — ABNORMAL HIGH (ref 70–99)
Potassium: 5.2 mmol/L — ABNORMAL HIGH (ref 3.5–5.1)
Sodium: 137 mmol/L (ref 135–145)

## 2020-07-27 LAB — URINALYSIS, ROUTINE W REFLEX MICROSCOPIC
Bilirubin Urine: NEGATIVE
Glucose, UA: 50 mg/dL — AB
Ketones, ur: NEGATIVE mg/dL
Leukocytes,Ua: NEGATIVE
Nitrite: NEGATIVE
Protein, ur: 100 mg/dL — AB
RBC / HPF: >50 RBC/hpf — ABNORMAL HIGH (ref 0–5)
Specific Gravity, Urine: 1.011 (ref 1.005–1.030)
pH: 6 (ref 5.0–8.0)

## 2020-07-27 LAB — GLUCOSE, CAPILLARY: Glucose-Capillary: 214 mg/dL — ABNORMAL HIGH (ref 70–99)

## 2020-07-27 LAB — BRAIN NATRIURETIC PEPTIDE: B Natriuretic Peptide: 2010.4 pg/mL — ABNORMAL HIGH (ref 0.0–100.0)

## 2020-07-27 LAB — TROPONIN I (HIGH SENSITIVITY)
Troponin I (High Sensitivity): 26 ng/L — ABNORMAL HIGH (ref <18)
Troponin I (High Sensitivity): 21 ng/L — ABNORMAL HIGH (ref <18)

## 2020-07-27 LAB — HEPATIC FUNCTION PANEL
ALT: 69 U/L — ABNORMAL HIGH (ref 0–44)
AST: 107 U/L — ABNORMAL HIGH (ref 15–41)
Albumin: 3.9 g/dL (ref 3.5–5.0)
Alkaline Phosphatase: 145 U/L — ABNORMAL HIGH (ref 38–126)
Bilirubin, Direct: 0.5 mg/dL — ABNORMAL HIGH (ref 0.0–0.2)
Indirect Bilirubin: 0.6 mg/dL (ref 0.3–0.9)
Total Bilirubin: 1.1 mg/dL (ref 0.3–1.2)
Total Protein: 6.4 g/dL — ABNORMAL LOW (ref 6.5–8.1)

## 2020-07-27 LAB — SARS CORONAVIRUS 2 BY RT PCR (HOSPITAL ORDER, PERFORMED IN ~~LOC~~ HOSPITAL LAB): SARS Coronavirus 2: NEGATIVE

## 2020-07-27 LAB — CBG MONITORING, ED: Glucose-Capillary: 100 mg/dL — ABNORMAL HIGH (ref 70–99)

## 2020-07-27 LAB — D-DIMER, QUANTITATIVE: D-Dimer, Quant: 2.57 ug/mL-FEU — ABNORMAL HIGH (ref 0.00–0.50)

## 2020-07-27 LAB — LACTIC ACID, PLASMA
Lactic Acid, Venous: 5.9 mmol/L (ref 0.5–1.9)
Lactic Acid, Venous: 2.8 mmol/L (ref 0.5–1.9)

## 2020-07-27 LAB — APTT: aPTT: 27 seconds (ref 24–36)

## 2020-07-27 LAB — HEPARIN LEVEL (UNFRACTIONATED): Heparin Unfractionated: 1.6 IU/mL — ABNORMAL HIGH (ref 0.30–0.70)

## 2020-07-27 LAB — LIPASE, BLOOD: Lipase: 43 U/L (ref 11–51)

## 2020-07-27 MED ORDER — MIDAZOLAM HCL 5 MG/5ML IJ SOLN: 1.0000 mg | INTRAMUSCULAR | Status: DC

## 2020-07-27 MED ORDER — ASPIRIN 300 MG RE SUPP
RECTAL | Status: AC
  Administered 2020-07-27: 300 mg via RECTAL
  Filled 2020-07-27: qty 1

## 2020-07-27 MED ORDER — SODIUM CHLORIDE 0.9 % IV SOLN
1.0000 g | INTRAVENOUS | Status: DC
  Administered 2020-07-27: 1 g via INTRAVENOUS
  Filled 2020-07-27 (×2): qty 1

## 2020-07-27 MED ORDER — DOCUSATE SODIUM 50 MG/5ML PO LIQD: 100.0000 mg | Freq: Two times a day (BID) | ORAL | Status: DC

## 2020-07-27 MED ORDER — HEPARIN (PORCINE) 25000 UT/250ML-% IV SOLN
800.0000 [IU]/h | INTRAVENOUS | Status: DC
  Administered 2020-07-27: 600 [IU]/h via INTRAVENOUS
  Filled 2020-07-27: qty 250

## 2020-07-27 MED ORDER — ASPIRIN 300 MG RE SUPP: 300.0000 mg | Freq: Once | RECTAL | Status: AC

## 2020-07-27 MED ORDER — DOCUSATE SODIUM 50 MG/5ML PO LIQD
100.0000 mg | Freq: Two times a day (BID) | ORAL | Status: DC | PRN
  Filled 2020-07-27: qty 10

## 2020-07-27 MED ORDER — SODIUM CHLORIDE 0.9 % IV SOLN
1.0000 g | Freq: Once | INTRAVENOUS | Status: AC
  Administered 2020-07-27: 1 g via INTRAVENOUS
  Filled 2020-07-27: qty 10

## 2020-07-27 MED ORDER — POLYETHYLENE GLYCOL 3350 17 G PO PACK
17.0000 g | PACK | Freq: Every day | ORAL | Status: DC
  Administered 2020-07-28 – 2020-07-29 (×2): 17 g
  Filled 2020-07-27 (×2): qty 1

## 2020-07-27 MED ORDER — EPINEPHRINE 1 MG/10ML IJ SOSY
PREFILLED_SYRINGE | INTRAMUSCULAR | Status: AC | PRN
  Administered 2020-07-27: .7 mg via INTRAVENOUS
  Administered 2020-07-27: 0.2 mg via INTRAVENOUS
  Administered 2020-07-27: 1 mg via INTRAVENOUS

## 2020-07-27 MED ORDER — NOREPINEPHRINE 4 MG/250ML-% IV SOLN
INTRAVENOUS | Status: AC | PRN
  Administered 2020-07-27: 40 ug/min via INTRAVENOUS

## 2020-07-27 MED ORDER — PANTOPRAZOLE SODIUM 40 MG IV SOLR
40.0000 mg | Freq: Every day | INTRAVENOUS | Status: DC
  Administered 2020-07-27 – 2020-07-29 (×3): 40 mg via INTRAVENOUS
  Filled 2020-07-27 (×3): qty 40

## 2020-07-27 MED ORDER — NOREPINEPHRINE 4 MG/250ML-% IV SOLN
0.0000 ug/min | INTRAVENOUS | Status: DC
  Administered 2020-07-28: 2 ug/min via INTRAVENOUS
  Filled 2020-07-27: qty 250

## 2020-07-27 MED ORDER — NOREPINEPHRINE BITARTRATE 1 MG/ML IV SOLN
INTRAVENOUS | Status: AC | PRN
  Administered 2020-07-27: 10 ug/kg/min via INTRAVENOUS

## 2020-07-27 MED ORDER — EPINEPHRINE HCL 5 MG/250ML IV SOLN IN NS
INTRAVENOUS | Status: AC
  Filled 2020-07-27: qty 250

## 2020-07-27 MED ORDER — EPINEPHRINE 0.1 MG/10ML (10 MCG/ML) SYRINGE FOR IV PUSH (FOR BLOOD PRESSURE SUPPORT)
PREFILLED_SYRINGE | INTRAVENOUS | Status: AC | PRN
  Administered 2020-07-27: 50 ug via INTRAVENOUS

## 2020-07-27 MED ORDER — POLYETHYLENE GLYCOL 3350 17 G PO PACK: 17.0000 g | PACK | Freq: Every day | ORAL | Status: DC | PRN

## 2020-07-27 MED ORDER — FENTANYL CITRATE (PF) 100 MCG/2ML IJ SOLN
25.0000 ug | INTRAMUSCULAR | Status: DC | PRN
  Administered 2020-07-28: 50 ug via INTRAVENOUS
  Administered 2020-07-28 (×2): 100 ug via INTRAVENOUS
  Administered 2020-07-28 (×2): 50 ug via INTRAVENOUS
  Filled 2020-07-27 (×5): qty 2

## 2020-07-27 MED ORDER — DOCUSATE SODIUM 100 MG PO CAPS: 100.0000 mg | ORAL_CAPSULE | Freq: Two times a day (BID) | ORAL | Status: DC | PRN

## 2020-07-27 MED ORDER — SODIUM CHLORIDE 0.9 % IV SOLN
INTRAVENOUS | Status: DC | PRN
  Administered 2020-07-27: 500 mL via INTRAVENOUS

## 2020-07-27 MED ORDER — FENTANYL CITRATE (PF) 100 MCG/2ML IJ SOLN: 25.0000 ug | INTRAMUSCULAR | Status: DC

## 2020-07-27 MED ORDER — POLYETHYLENE GLYCOL 3350 17 G PO PACK: 17.0000 g | PACK | Freq: Every day | ORAL | Status: DC

## 2020-07-27 MED ORDER — CHLORHEXIDINE GLUCONATE 0.12% ORAL RINSE (MEDLINE KIT)
15.0000 mL | Freq: Two times a day (BID) | OROMUCOSAL | Status: DC
  Administered 2020-07-28 – 2020-07-29 (×3): 15 mL via OROMUCOSAL

## 2020-07-27 MED ORDER — MIDAZOLAM HCL 5 MG/5ML IJ SOLN
INTRAMUSCULAR | Status: AC
  Administered 2020-07-27: 2.5 mg via INTRAVENOUS
  Filled 2020-07-27: qty 5

## 2020-07-27 MED ORDER — VANCOMYCIN HCL IN DEXTROSE 1-5 GM/200ML-% IV SOLN
1000.0000 mg | Freq: Once | INTRAVENOUS | Status: AC
  Administered 2020-07-27: 1000 mg via INTRAVENOUS
  Filled 2020-07-27: qty 200

## 2020-07-27 MED ORDER — ORAL CARE MOUTH RINSE
15.0000 mL | OROMUCOSAL | Status: DC
  Administered 2020-07-28 – 2020-07-29 (×16): 15 mL via OROMUCOSAL

## 2020-07-27 MED ORDER — DOCUSATE SODIUM 50 MG/5ML PO LIQD
100.0000 mg | Freq: Two times a day (BID) | ORAL | Status: DC
  Administered 2020-07-28 – 2020-07-30 (×4): 100 mg
  Filled 2020-07-27 (×6): qty 10

## 2020-07-27 MED ORDER — DEXTROSE 50 % IV SOLN
INTRAVENOUS | Status: AC
  Administered 2020-07-27: 50 mL
  Filled 2020-07-27: qty 50

## 2020-07-27 MED ORDER — SODIUM BICARBONATE 8.4 % IV SOLN
INTRAVENOUS | Status: AC | PRN
  Administered 2020-07-27 (×2): 50 meq via INTRAVENOUS

## 2020-07-27 MED ORDER — SODIUM CHLORIDE 0.9 % IV BOLUS
1000.0000 mL | Freq: Once | INTRAVENOUS | Status: AC
  Administered 2020-07-27: 1000 mL via INTRAVENOUS

## 2020-07-27 MED ORDER — SODIUM CHLORIDE 0.9 % IV SOLN
500.0000 mg | Freq: Once | INTRAVENOUS | Status: AC
  Administered 2020-07-27: 500 mg via INTRAVENOUS
  Filled 2020-07-27: qty 500

## 2020-07-27 MED ORDER — DEXTROSE 50 % IV SOLN
INTRAVENOUS | Status: AC
  Filled 2020-07-27: qty 50

## 2020-07-27 MED ORDER — NOREPINEPHRINE 4 MG/250ML-% IV SOLN
INTRAVENOUS | Status: AC
  Filled 2020-07-27: qty 250

## 2020-07-27 MED ORDER — VANCOMYCIN HCL 500 MG/100ML IV SOLN: 500.0000 mg | INTRAVENOUS | Status: DC

## 2020-07-27 MED ORDER — HEPARIN BOLUS VIA INFUSION
3000.0000 [IU] | Freq: Once | INTRAVENOUS | Status: AC
  Administered 2020-07-27: 3000 [IU] via INTRAVENOUS
  Filled 2020-07-27: qty 3000

## 2020-07-27 MED ORDER — FENTANYL CITRATE (PF) 100 MCG/2ML IJ SOLN
INTRAMUSCULAR | Status: AC
  Administered 2020-07-27: 25 ug via INTRAVENOUS
  Filled 2020-07-27: qty 2

## 2020-07-27 MED ORDER — FENTANYL CITRATE (PF) 100 MCG/2ML IJ SOLN
25.0000 ug | INTRAMUSCULAR | Status: DC | PRN
  Administered 2020-07-28: 25 ug via INTRAVENOUS
  Filled 2020-07-27: qty 2

## 2020-07-27 MED ORDER — HEPARIN SODIUM (PORCINE) 5000 UNIT/ML IJ SOLN: 5000.0000 [IU] | Freq: Three times a day (TID) | INTRAMUSCULAR | Status: DC

## 2020-07-27 NOTE — ED Notes (Signed)
Called to give report to [facility]- this RN contact info provided for callback.

## 2020-07-27 NOTE — ED Notes (Addendum)
Called [facility] spoke to [facility] to page admitting doctor for Dr Ron Parker @ [phone]

## 2020-07-27 NOTE — ED Notes (Signed)
Warm blankets x 2 applied; overhead light on near pt's head

## 2020-07-27 NOTE — ED Notes (Signed)
Pt noted to become unresponsive & LT sided fixed gaze noted by [facility] staff

## 2020-07-27 NOTE — ED Notes (Signed)
Pt asymptomatic with hypotension

## 2020-07-27 NOTE — Progress Notes (Signed)
Pharmacy Antibiotic Note  Shawna Curry is a 86 y.o. female admitted on 07/27/2020 with sepsis.  Pharmacy has been consulted for vancomycin and cefepime dosing.  S/p PEA arrest, no fevers noted, wbc normal at 6. Bilateral pleural effusions and atelectasis on cxr. Azith and ceftriaxone given in ED, orders to broaden antibiotics now in the ICU.   Plan: Vancomycin 1g IV now then 500mg   IV every 48 hours.  Goal trough 15-20 mcg/mL. Cefepime 1g q24 hours     Temp (24hrs), Avg:96.8 F (36 C), Min:96.5 F (35.8 C), Max:97 F (36.1 C)  Recent Labs  Lab 07/25/20 1332 07/27/20 1250 07/27/20 1548  WBC 6.4 6.1  --   CREATININE 1.64* 1.90*  --   LATICACIDVEN  --  5.9* 2.8*    Estimated Creatinine Clearance: 16.6 mL/min (A) (by C-G formula based on SCr of 1.9 mg/dL (H)).    No Known Allergies   Thank you for allowing pharmacy to be a part of this patient's care.  Erin Hearing PharmD., BCPS Clinical Pharmacist 07/27/2020 9:07 PM

## 2020-07-27 NOTE — ED Provider Notes (Addendum)
CPR  Date/Time: 07/27/2020 6:41 PM Performed by: Breck Coons, MD Authorized by: Breck Coons, MD  CPR Procedure Details:    CPR/ACLS performed in the ED: Yes     Outcome: ROSC obtained    CPR performed via ACLS guidelines under my direct supervision.  See RN documentation for details including defibrillator use, medications, doses and timing. Comments:     Approximately 5 minutes of CPR with PEA arrest epinephrine given pulses return, bicarbonate given in the setting of lactic acidosis,  Procedure Name: Intubation Date/Time: 07/27/2020 6:43 PM Performed by: Breck Coons, MD Pre-anesthesia Checklist: Patient identified, Patient being monitored, Emergency Drugs available, Timeout performed and Suction available Oxygen Delivery Method: Non-rebreather mask Preoxygenation: Pre-oxygenation with 100% oxygen Ventilation: Mask ventilation without difficulty Laryngoscope Size: Glidescope and 3 Grade View: Grade II Tube size: 7.0 mm Number of attempts: 1 Placement Confirmation: ETT inserted through vocal cords under direct vision,  CO2 detector and Breath sounds checked- equal and bilateral    .Critical Care Performed by: Breck Coons, MD Authorized by: Breck Coons, MD   Critical care provider statement:    Critical care time (minutes):  35   Critical care was time spent personally by me on the following activities:  Discussions with consultants, evaluation of patient's response to treatment, examination of patient, ordering and performing treatments and interventions, ordering and review of laboratory studies, ordering and review of radiographic studies, pulse oximetry, re-evaluation of patient's condition, obtaining history from patient or surrogate, review of old charts and development of treatment plan with patient or surrogate   I was called to bedside due to acute respiratory changes.  Patient also had a possible syncopal episode.  She was having difficulty verbalizing, and there is  potential concern for stroke, blood glucose was 100.  However her blood pressure continued to drop.  I thought this was more hypotension secondary to underlying metabolic cause.  Blood pressure was attempted to be restored with IV fluids however it was unsuccessful blood pressure medication norepinephrine was started.  The blood pressure continued to drop.  Eventually we lost pulses.  Patient was intubated with glide scope no medications for RSI.  Epinephrine was bolused several times and infusion was started when return of spontaneous circulation was obtained.  Patient had PEA arrest.  Sedated afterwards with Versed and fentanyl.  Was breathing over the vent and moving intermittently.  I spoke to the cardiology attending on call for STEMI's because EKG showed inverted T waves in V5 V6 with 1 mm of elevation in V4.  He does not feel there is emergent catheterization needed at this time.  PR aspirin is given she will be transported to ICU bed for further management.  Post intubation chest x-ray is interpreted bedside by myself to show an adequately positioned ET tube OG tube and no signs of acute cardiopulmonary change.   Breck Coons, MD 07/27/20 1845    Breck Coons, MD 07/27/20 [phone]

## 2020-07-27 NOTE — Progress Notes (Signed)
ANTICOAGULATION CONSULT NOTE - Initial Consult  Pharmacy Consult for heparin Indication: chest pain/ACS  No Known Allergies  Patient Measurements:   Heparin Dosing Weight: 48kg  Vital Signs: Temp: 96.5 F (35.8 C) (08/21 1321) Temp Source: Rectal (08/21 1321) BP: 118/64 (08/21 1857) Pulse Rate: 110 (08/21 1857)  Labs: Recent Labs    07/25/20 1332 07/25/20 1332 07/25/20 1523 07/27/20 1250 07/27/20 1548 07/27/20 1811  HGB 10.7*   < >  --  10.3*  --  9.2*  HCT 33.6*  --   --  32.8*  --  27.0*  PLT 231  --   --  221  --   --   CREATININE 1.64*  --   --  1.90*  --   --   TROPONINIHS 29*   < > 26* 26* 21*  --    < > = values in this interval not displayed.    Estimated Creatinine Clearance: 16.6 mL/min (A) (by C-G formula based on SCr of 1.9 mg/dL (H)).   Medical History: Past Medical History:  Diagnosis Date  . Atrial fibrillation ([place])   . Hypertension     Assessment: 86 year old woman with hx of CAD, hx of cardiac stent, Atrial fibrillation, HFpEF, HLD, HTN, PVD presented with sob, subsequently suffered PEA arrest and received intubation and CPR in ED.   Noted history of afib, anticoagulation note from 07/23/20 indicates that she was on apixaban prior to admit, unable to verify at this time. Will check baseline heparin level and aptt. If heparin level elevated will assume she was on apixaban and monitor anticoagulation based on aptts. Hgb down to 9.2 this evening from istat.   Goal of Therapy:  Heparin level 0.3-0.7 units/ml aPTT 66-102 seconds Monitor platelets by anticoagulation protocol: Yes   Plan:  Give 3000 units bolus x 1 Start heparin infusion at 600 units/hr Aptt and heparin level at baseline Check anti-Xa level and aptt in 8 hours and daily while on heparin Continue to monitor H&H and platelets  Erin Hearing PharmD., BCPS Clinical Pharmacist 07/27/2020 9:04 PM

## 2020-07-27 NOTE — Code Documentation (Signed)
996 palpated systolic BP

## 2020-07-27 NOTE — Code Documentation (Signed)
16 Fr OG tube inserted by [employer] ([facility])

## 2020-07-27 NOTE — Code Documentation (Signed)
Vent settings per [facility]: Rate:16, Vol: 520; PEEP 5; 100%

## 2020-07-27 NOTE — H&P (Addendum)
NAMENicoya Curry, MRN:  [id number], DOB:  February 17, 1933, LOS: 0 ADMISSION DATE:  07/27/2020, CONSULTATION DATE:  07/27/20 REFERRING MD:  ED, CHIEF COMPLAINT:  SOB   Brief History    86 year old woman with hx of CAD, hx of cardiac stent, Atrial fibrillation, HFpEF, HLD, HTN, PVD, here with shortness of breath, PEA cardiac arrest following sudden hypotension and intubation in ED prior to transfer to [place].  History of present illness   Presented to ED today for SOB.  Was seen 8/19 for SOB in ED, effusion and atelectasis noted, given extra dose lasix, sent home.  Returned with SOB again on 8/21 late am, Found to have lactate 5.9.  Was initially hypotensive to 09N systolic and hypothermic, but responded to fluids initially (1L pernote).  Mild AKI.    started on ceftriaxone and azithromycin (poss infiltrated on CT from 8/19) Some Hypotension in the afternoon, increased SOB, sat not reading, 2L placed on her.  She became unresponsive.  BP dropped, IV fluids started, pressors started, developed PEA cardiac arrest.  Intubated immediately and sedated.  Unclear duration of code ("epinephrine was bolused several times") Was breathing over and moving spontaneously after arrest.   ED called "cardiology attending on call for STEMI's because EKG showed inverted T waves in V5 V6 with 1 mm of elevation in V4.  He does not feel there is emergent catheterization needed at this time.  PR aspirin is given"  ET tube and OG tube placement confirmed prior to transfer.  Past Medical History  CAD, hx of cardiac stent, HTN, Atrial fibrillation, HFpEF, HLD,  Chronic R foot wound (vascular?) Chronic toe ulcers from PVD ( R big toe and second toe) Hx hysterectomy Hand tendon surgery R hip replacement  Cervical carotic arterial stent  Lipitor, plavix, zetia, iron sulfate, folate, lisinopril , lovaza, risendronate, topamax, ELIQUIS Significant Hospital Events   PEA cardiac arrest 8/21, intubation  8/21  Consults:    Procedures:  Intubation 8/21   Significant Diagnostic Tests:   Cr 1.64 -->1.9, BUN 42-->51 BNP 2010 Trop 26-->21  Micro Data:    Antimicrobials:  azithro ceftriaxone x 1 8/21 Cefepime 8/21-->   vanc 8/21 -->  Interim history/subjective:    Objective   Blood pressure 118/64, pulse (!) 110, temperature (!) 96.5 F (35.8 C), temperature source Rectal, resp. rate 16, SpO2 99 %.        Intake/Output Summary (Last 24 hours) at 07/27/2020 1954 Last data filed at 07/27/2020 1919 Gross per 24 hour  Intake 1268.97 ml  Output --  Net 1268.97 ml   There were no vitals filed for this visit.  Examination: General: NAD, non responsive, minimal spontaneous movements at this point  HENT: NCAT, PERRL Lungs: CTAB Cardiovascular: RRR no mgr   Abdomen: NT, ND, NBS Extremities: no edema, hyperpigmented skin on feel B.  Neuro:  Non responsive to me, reports that she has been withdrawing to some pain.  GU: appearance of red nodule overlying urethral opening, retracts easily,  foley passed easily  Resolved Hospital Problem list     Assessment & Plan:  S/p PEA cardiac arrest concurrent with hypotensive episode.  Arrest seems likely 2/2 hypotension.   Unclear cause.  Sepsis possible.  Hypothermic, though no leukocytosis.  Chest imaging not very convincing for pneumonia, no significant pulmonary edema on initial cxr. Consider cardiac etiology.  Bedside echo did not reveal any effusion, some reduced EF, no signs of RV failure.  troponins only minimally elevated. PE seems  unlikely though I was concerned initially given symptoms of acute SOB and back pain a few days ago.  Has been compliant with her eliquis and she has no known risk factors for PE. Regardless, she will be anticoagulated for her afib.  Check TSH and cortisol.   Check head CT to r/o hemorrhagic stroke, though does not entirely fit with this clinical picture. Only minimal movement at this time, some minimal  withdrawal to pain.  Did receive some sedation fairly recently.   Weaned off pressors at the time of my exam.   Will montior closely. Additional plan: formal echo tomorrow.  Check Blood cultures and UA.  Repeat EKG.  Broad spectrum abtx for now.   Acute dyspnea: unclear cause.  CXR and CT chest with no significant abnormalities. PE seems very unlikely based on PaO2, bedside echo findings, and compliance with anticoagulation.  Possibly 2/2 afib with RVR.  BNP elevated but pulm edema doesn't seem significant.   Afib: on eliquis at home.  Heparin while acutely ill.   AKI : cr up to 1.9.   Possibly cardiorenal. Check ua.  Monitor UOP.  Best practice:  Diet:NPO for now  Pain/Anxiety/Delirium protocol (if indicated): prn  VAP protocol (if indicated): yes DVT prophylaxis: heparin gtt GI prophylaxis: ppi Glucose control: q1 bs  Mobility: bed Code Status: full Family Communication: daughter at bedside  Disposition:   Labs   CBC: Recent Labs  Lab 07/25/20 1332 07/27/20 1250 07/27/20 1811  WBC 6.4 6.1  --   NEUTROABS  --  4.8  --   HGB 10.7* 10.3* 9.2*  HCT 33.6* 32.8* 27.0*  MCV 88.7 89.1  --   PLT 231 221  --     Basic Metabolic Panel: Recent Labs  Lab 07/25/20 1332 07/27/20 1250 07/27/20 1811  NA 140 137 144  K 5.2* 5.2* 4.5  CL 100 98  --   CO2 26 21*  --   GLUCOSE 122* 114*  --   BUN 42* 51*  --   CREATININE 1.64* 1.90*  --   CALCIUM 9.7 9.5  --    GFR: Estimated Creatinine Clearance: 16.6 mL/min (A) (by C-G formula based on SCr of 1.9 mg/dL (H)). Recent Labs  Lab 07/25/20 1332 07/27/20 1250 07/27/20 1548  WBC 6.4 6.1  --   LATICACIDVEN  --  5.9* 2.8*    Liver Function Tests: Recent Labs  Lab 07/25/20 1332 07/27/20 1250  AST 53* 107*  ALT 44 69*  ALKPHOS 126 145*  BILITOT 1.1 1.1  PROT 6.9 6.4*  ALBUMIN 4.0 3.9   Recent Labs  Lab 07/27/20 1250  LIPASE 43   No results for input(s): AMMONIA in the last 168 hours.  ABG    Component  Value Date/Time   PHART 7.233 (L) 07/27/2020 1811   PCO2ART 41.1 07/27/2020 1811   PO2ART 142 (H) 07/27/2020 1811   HCO3 17.3 (L) 07/27/2020 1811   TCO2 19 (L) 07/27/2020 1811   ACIDBASEDEF 10.0 (H) 07/27/2020 1811   O2SAT 99.0 07/27/2020 1811     Coagulation Profile: No results for input(s): INR, PROTIME in the last 168 hours.  Cardiac Enzymes: No results for input(s): CKTOTAL, CKMB, CKMBINDEX, TROPONINI in the last 168 hours.  HbA1C: No results found for: HGBA1C  CBG: Recent Labs  Lab 07/27/20 1733  GLUCAP 100*    Review of Systems:   Unable to assess  Past Medical History  She,  has a past medical history of Atrial fibrillation ([place]) and Hypertension.  Surgical History    Past Surgical History:  Procedure Laterality Date  . ABDOMINAL HYSTERECTOMY    . HAND TENDON SURGERY    . JOINT REPLACEMENT     R hip x 2  . PERCUTANEOUS PLACEMENT INTRAVASCULAR STENT CERVICAL CAROTID ARTERY    . TONSILLECTOMY       Social History   reports that she has quit smoking. She has never used smokeless tobacco. She reports that she does not drink alcohol and does not use drugs.   Family History   Her family history is not on file.   Allergies No Known Allergies   Home Medications  Prior to Admission medications   Medication Sig Start Date End Date Taking? Authorizing Provider  atorvastatin (LIPITOR) 40 MG tablet Take 40 mg by mouth daily.    [provider]  bimatoprost (LATISSE) 0.03 % ophthalmic solution Place into both eyes at bedtime. Place one drop on applicator and apply evenly along the skin of the upper eyelid at base of eyelashes once daily at bedtime; repeat procedure for second eye (use a clean applicator).    [provider]  calcium-vitamin D (OSCAL WITH D) 500-200 MG-UNIT tablet Take 1 tablet by mouth.    [provider]  clopidogrel (PLAVIX) 75 MG tablet Take 75 mg by mouth daily.    [provider]  ezetimibe (ZETIA) 10 MG  tablet Take 10 mg by mouth daily.    [provider]  ferrous sulfate 325 (65 FE) MG tablet Take 325 mg by mouth daily with breakfast.    [provider]  folic acid (FOLVITE) 1 MG tablet Take 1 mg by mouth daily.    [provider]  lisinopril (PRINIVIL,ZESTRIL) 20 MG tablet Take 20 mg by mouth daily.    [provider]  omega-3 acid ethyl esters (LOVAZA) 1 g capsule Take by mouth 2 (two) times daily.    [provider]  risedronate (ACTONEL) 35 MG tablet Take 35 mg by mouth every 7 (seven) days. with water on empty stomach, nothing by mouth or lie down for next 30 minutes.    [provider]  topiramate (TOPAMAX) 50 MG tablet Take 50 mg by mouth 2 (two) times daily.    [provider]     Critical care time: 45 min

## 2020-07-27 NOTE — ED Notes (Signed)
Lactic Acid 2.8, results given to [employer]

## 2020-07-27 NOTE — Code Documentation (Signed)
ABG obtained

## 2020-07-27 NOTE — ED Provider Notes (Signed)
[facility] EMERGENCY DEPARTMENT Provider Note   CSN: [id number] Arrival date & time: 07/27/20  1111     History Chief Complaint  Patient presents with  . Shortness of Breath  . Foot Pain    Shawna Curry is a 86 y.o. female.  The history is provided by the patient.  Shortness of Breath Severity:  Mild Onset quality:  Gradual Timing:  Intermittent Progression:  Waxing and waning Chronicity:  New Context: not URI   Relieved by:  Nothing Worsened by:  Exertion Associated symptoms: no abdominal pain, no chest pain, no cough, no ear pain, no fever, no rash, no sore throat, no sputum production and no vomiting   Risk factors: no hx of PE/DVT   Risk factors comment:  Afib, CAD, PVD      Past Medical History:  Diagnosis Date  . Atrial fibrillation ([place])   . Hypertension     There are no problems to display for this patient.   Past Surgical History:  Procedure Laterality Date  . ABDOMINAL HYSTERECTOMY    . HAND TENDON SURGERY    . JOINT REPLACEMENT     R hip x 2  . PERCUTANEOUS PLACEMENT INTRAVASCULAR STENT CERVICAL CAROTID ARTERY    . TONSILLECTOMY       OB History   No obstetric history on file.     No family history on file.  Social History   Tobacco Use  . Smoking status: Former Research scientist (life sciences)  . Smokeless tobacco: Never Used  Substance Use Topics  . Alcohol use: Never  . Drug use: Never    Home Medications Prior to Admission medications   Medication Sig Start Date End Date Taking? Authorizing Provider  atorvastatin (LIPITOR) 40 MG tablet Take 40 mg by mouth daily.    [provider]  bimatoprost (LATISSE) 0.03 % ophthalmic solution Place into both eyes at bedtime. Place one drop on applicator and apply evenly along the skin of the upper eyelid at base of eyelashes once daily at bedtime; repeat procedure for second eye (use a clean applicator).    [provider]  calcium-vitamin D (OSCAL WITH D) 500-200 MG-UNIT tablet Take 1  tablet by mouth.    [provider]  clopidogrel (PLAVIX) 75 MG tablet Take 75 mg by mouth daily.    [provider]  ezetimibe (ZETIA) 10 MG tablet Take 10 mg by mouth daily.    [provider]  ferrous sulfate 325 (65 FE) MG tablet Take 325 mg by mouth daily with breakfast.    [provider]  folic acid (FOLVITE) 1 MG tablet Take 1 mg by mouth daily.    [provider]  lisinopril (PRINIVIL,ZESTRIL) 20 MG tablet Take 20 mg by mouth daily.    [provider]  omega-3 acid ethyl esters (LOVAZA) 1 g capsule Take by mouth 2 (two) times daily.    [provider]  risedronate (ACTONEL) 35 MG tablet Take 35 mg by mouth every 7 (seven) days. with water on empty stomach, nothing by mouth or lie down for next 30 minutes.    [provider]  topiramate (TOPAMAX) 50 MG tablet Take 50 mg by mouth 2 (two) times daily.    [provider]    Allergies    Patient has no known allergies.  Review of Systems   Review of Systems  Constitutional: Negative for chills and fever.  HENT: Negative for ear pain and sore throat.   Eyes: Negative for pain and  visual disturbance.  Respiratory: Positive for shortness of breath. Negative for cough and sputum production.   Cardiovascular: Negative for chest pain and palpitations.  Gastrointestinal: Negative for abdominal pain and vomiting.  Genitourinary: Negative for dysuria and hematuria.  Musculoskeletal: Positive for arthralgias (foot pain). Negative for back pain.  Skin: Positive for color change and wound. Negative for rash.  Neurological: Negative for seizures and syncope.  All other systems reviewed and are negative.   Physical Exam Updated Vital Signs  ED Triage Vitals  Enc Vitals Group     BP 07/27/20 1212 (!) 84/67     Pulse Rate 07/27/20 1121 70     Resp 07/27/20 1121 (!) 32     Temp 07/27/20 1129 (!) 97 F (36.1 C)     Temp Source 07/27/20 1129 Axillary     SpO2  07/27/20 1212 94 %     Weight --      Height --      Head Circumference --      Peak Flow --      Pain Score 07/27/20 1118 0     Pain Loc --      Pain Edu? --      Excl. in [place]? --     Physical Exam Vitals and nursing note reviewed.  Constitutional:      General: She is not in acute distress.    Appearance: She is well-developed.  HENT:     Head: Normocephalic and atraumatic.  Eyes:     Conjunctiva/sclera: Conjunctivae normal.     Pupils: Pupils are equal, round, and reactive to light.  Cardiovascular:     Rate and Rhythm: Normal rate and regular rhythm.     Heart sounds: Normal heart sounds. No murmur heard.      Comments: Unable to palpate DP or PT pulses (with doppler not confident I hear pulses b/l), 2+/4 femoral pulses bilaterally  Pulmonary:     Effort: Pulmonary effort is normal. No respiratory distress.     Breath sounds: Normal breath sounds. No decreased breath sounds, wheezing or rhonchi.  Abdominal:     Palpations: Abdomen is soft.     Tenderness: There is no abdominal tenderness.  Musculoskeletal:     Cervical back: Neck supple.     Right lower leg: Edema (trace) present.     Left lower leg: Edema (trace) present.  Skin:    General: Skin is warm and dry.     Comments: Cold feet bilaterally, ulcer to first and second toes on the right with not active drainage or erythema   Neurological:     General: No focal deficit present.     Mental Status: She is alert.     Comments: 5+ out of 5 strength in the lower extremities with normal sensation in bilateral feet     ED Results / Procedures / Treatments   Labs (all labs ordered are listed, but only abnormal results are displayed) Labs Reviewed  CBC WITH DIFFERENTIAL/PLATELET - Abnormal; Notable for the following components:      Result Value   RBC 3.68 (*)    Hemoglobin 10.3 (*)    HCT 32.8 (*)    RDW 16.7 (*)    nRBC 0.3 (*)    Lymphs Abs 0.6 (*)    All other components within normal limits  BASIC  METABOLIC PANEL - Abnormal; Notable for the following components:   Potassium 5.2 (*)    CO2 21 (*)    Glucose, Bld 114 (*)  BUN 51 (*)    Creatinine, Ser 1.90 (*)    GFR calc non Af Amer 24 (*)    GFR calc Af Amer 27 (*)    Anion gap 18 (*)    All other components within normal limits  HEPATIC FUNCTION PANEL - Abnormal; Notable for the following components:   Total Protein 6.4 (*)    AST 107 (*)    ALT 69 (*)    Alkaline Phosphatase 145 (*)    Bilirubin, Direct 0.5 (*)    All other components within normal limits  BRAIN NATRIURETIC PEPTIDE - Abnormal; Notable for the following components:   B Natriuretic Peptide 2,010.4 (*)    All other components within normal limits  LACTIC ACID, PLASMA - Abnormal; Notable for the following components:   Lactic Acid, Venous 5.9 (*)    All other components within normal limits  D-DIMER, QUANTITATIVE (NOT AT [facility]) - Abnormal; Notable for the following components:   D-Dimer, Quant 2.57 (*)    All other components within normal limits  TROPONIN I (HIGH SENSITIVITY) - Abnormal; Notable for the following components:   Troponin I (High Sensitivity) 26 (*)    All other components within normal limits  SARS CORONAVIRUS 2 BY RT PCR (HOSPITAL ORDER, [place] [facility])  CULTURE, BLOOD (ROUTINE X 2)  CULTURE, BLOOD (ROUTINE X 2)  URINE CULTURE  LIPASE, BLOOD  LACTIC ACID, PLASMA  URINALYSIS, ROUTINE W REFLEX MICROSCOPIC  TROPONIN I (HIGH SENSITIVITY)    EKG EKG Interpretation  Date/Time:  Saturday July 27 2020 12:35:55 EDT Ventricular Rate:  68 PR Interval:    QRS Duration: 92 QT Interval:  431 QTC Calculation: 459 R Axis:   100 Text Interpretation: Atrial fibrillation Ventricular premature complex Right axis deviation Low voltage, precordial leads Confirmed by Lennice Sites [phone]) on 07/27/2020 1:22:08 PM   Radiology DG Chest 2 View  Result Date: 07/27/2020 CLINICAL DATA:  Shortness of breath for several days.  EXAM: CHEST - 2 VIEW COMPARISON:  CT chest and PA and lateral chest 07/25/2020. FINDINGS: Very small bilateral pleural effusions are again seen. There is cardiomegaly. Aortic atherosclerosis. Lungs are emphysematous but clear. No acute bony abnormality. Remote left sixth rib fracture noted. IMPRESSION: No change in small bilateral pleural effusions and mild basilar atelectasis. Cardiomegaly without edema. Aortic Atherosclerosis (ICD10-I70.0) and Emphysema (ICD10-J43.9). Electronically Signed   By: Inge Rise M.D.   On: 07/27/2020 12:45   CT Chest Wo Contrast  Result Date: 07/25/2020 CLINICAL DATA:  Shortness of breath and weakness. EXAM: CT CHEST WITHOUT CONTRAST TECHNIQUE: Multidetector CT imaging of the chest was performed following the standard protocol without IV contrast. COMPARISON:  Apr 22, 2020 FINDINGS: Cardiovascular: There is marked severity calcification of the aortic arch. There is mild cardiomegaly. No pericardial effusion. Marked severity coronary artery calcification is noted Mediastinum/Nodes: No enlarged mediastinal or axillary lymph nodes. Thyroid gland, trachea, and esophagus demonstrate no significant findings. Lungs/Pleura: There is mild biapical scarring and/or atelectasis. Mild posterior right basilar atelectasis and/or infiltrate is seen. There is a small right pleural effusion. No pneumothorax is identified. Upper Abdomen: No acute abnormality. Musculoskeletal: Chronic second, third, fourth, fifth, 6 and seventh anterior right rib fractures are seen additional chronic anterior third, fourth and fifth left rib fractures are noted. Multilevel degenerative changes seen throughout the thoracic spine. IMPRESSION: 1. Mild posterior right basilar atelectasis and/or infiltrate. 2. Small right pleural effusion. 3. Marked severity coronary artery calcification. 4. Chronic bilateral rib fractures. 5. Aortic atherosclerosis.  Aortic Atherosclerosis (ICD10-I70.0). Electronically Signed   By:  Virgina Norfolk M.D.   On: 07/25/2020 16:10   DG Foot Complete Right  Result Date: 07/27/2020 CLINICAL DATA:  Plantar ulceration, right foot pain, decreased pulses EXAM: RIGHT FOOT COMPLETE - 3+ VIEW COMPARISON:  02/26/2020 FINDINGS: Frontal, oblique, and lateral views of the right foot are obtained. Bones remain osteopenic. Prior resection or resorption of the fifth middle phalanx unchanged. There are no acute or destructive bony lesions. Specifically, no radiographic evidence of osteomyelitis. Prominent calcaneal spurs are stable. Diffuse vascular calcifications are noted. IMPRESSION: 1. No acute or destructive bony lesion. Electronically Signed   By: Randa Ngo M.D.   On: 07/27/2020 15:13    Procedures .Critical Care Performed by: Lennice Sites, DO Authorized by: Lennice Sites, DO   Critical care provider statement:    Critical care time (minutes):  45   Critical care was necessary to treat or prevent imminent or life-threatening deterioration of the following conditions:  Sepsis   Critical care was time spent personally by me on the following activities:  Blood draw for specimens, development of treatment plan with patient or surrogate, discussions with primary provider, evaluation of patient's response to treatment, examination of patient, discussions with consultants, obtaining history from patient or surrogate, ordering and performing treatments and interventions, ordering and review of laboratory studies, ordering and review of radiographic studies, pulse oximetry, re-evaluation of patient's condition and review of old charts   I assumed direction of critical care for this patient from another provider in my specialty: no     (including critical care time)  Medications Ordered in ED Medications  cefTRIAXone (ROCEPHIN) 1 g in sodium chloride 0.9 % 100 mL IVPB (has no administration in time range)  azithromycin (ZITHROMAX) 500 mg in sodium chloride 0.9 % 250 mL IVPB (has no  administration in time range)  sodium chloride 0.9 % bolus 1,000 mL (1,000 mLs Intravenous New Bag/Given 07/27/20 1342)    ED Course  I have reviewed the triage vital signs and the nursing notes.  Pertinent labs & imaging results that were available during my care of the patient were reviewed by me and considered in my medical decision making (see chart for details).    MDM Rules/Calculators/A&P                          Shawna Curry is an 86 year old female with history of heart failure, peripheral vascular disease, atrial fibrillation on Eliquis, CAD, hypertension who presents to the ED with shortness of breath, foot pain.  Patient arrives hypotensive 70 systolic, hypothermic.  However appears to have normal room air oxygenation.  She is mildly tachypneic but not tachycardic.  EKG shows rate controlled atrial fibrillation.  No obvious ischemic changes.  Does not endorse any infectious symptoms.  No cough, no pain with urination.  Has had chronic right foot wound and follows closely with podiatry, wound care, vascular surgery.  Patient given IV fluid bolus and blood pressure improved fairly quickly.  Sepsis labs were ordered but antibiotics held as concern that this could be from dehydration or noninfectious process.  D-dimer, troponin, BNP was also ordered.  She had some trace edema in her legs.  She has chronic ulcers to her right big toe and second toe.  Initially I was unable to palpate or Doppler pulses in her feet but after IV fluids on recheck I feel that she likely does have a monophasic pulse in the right  foot and does have a good biphasic pulse in the left foot with Doppler.  I actually talked with Dr. Renard Matter with vascular surgery at [facility] as initially I was concerned about arterial process but patient did have a strong femoral pulse and he feels that overall the feet issues are likely chronic.  Have a lower suspicion for acute vascular process in the lower legs.  He did not believe there  is any need for any further work-up from a vascular standpoint.  Lab work however shows lactic acid of 5.9.  Creatinine mildly elevated above her baseline from 1.1-1.9.  This is up from 1.6 several days ago.  BUN elevated to 51.  Anion gap 18.  Likely in the setting of lactic acidosis.  However no leukocytosis.  No significant anemia.  Covid is negative.  Chest x-ray shows no obvious infectious process.  There are small bilateral pleural effusions and mild basilar atelectasis.  BNP is elevated to 2000 and troponin is 26.  D-dimer was obtained and is also elevated.  Patient did have a CT scan of the chest several days ago that showed edema versus infectious process.  This was done without contrast due to chronic kidney disease.  Overall have lower suspicion for PE as patient is on Eliquis however will discuss with hospitalist at [facility].  Patient does follow closely with multiple services there and will attempt to admit her there first if not will call [place].  Do not have an obvious infectious source.  This all could be from primary dehydration as well.  Does not have any abdominal pain.  [facility] full, family actually prefers seeing other doctors and will admit to [facility] health and will call hospitalist.  I have started antibiotics after reviewing CT scan from 2 days ago that was concerning for possible infiltrate.  Will discuss with hospitalist about whether or not to start further anticoagulation given elevated D-dimer as patient is on Eliquis.  Appears to be hemodynamically stable now after a liter of IV fluids.  Lactic acid is to be trended.  Awaiting urinalysis as well.  Suspect sepsis versus volume depletion/dehydration.  Overall shortness of breath could be multifactorial.  This chart was dictated using voice recognition software.  Despite best efforts to proofread,  errors can occur which can change the documentation meaning.    Final Clinical Impression(s) / ED  Diagnoses Final diagnoses:  SOB (shortness of breath)  Hypotension, unspecified hypotension type  Sepsis, due to unspecified organism, unspecified whether acute organ dysfunction present [facility])    Rx / [place] Orders ED Discharge Orders    None       Lennice Sites, DO 07/27/20 1547

## 2020-07-27 NOTE — ED Notes (Signed)
Called [facility] @ [phone] (Vascular) for Dr Ronnald Nian spoke to [facility] @ [phone]

## 2020-07-27 NOTE — ED Notes (Signed)
Patient complaining increased SHOB.  BBS decreased unable to obtain SpO2 placed on 2l/n [place]. [facility] now at bedside to transport.

## 2020-07-27 NOTE — Code Documentation (Signed)
Family updated as to patient's status by EDP.  

## 2020-07-27 NOTE — ED Notes (Signed)
Called [facility] (Hospitalist) per Dr Ronnald Nian

## 2020-07-27 NOTE — Code Documentation (Signed)
CXR portable complete

## 2020-07-27 NOTE — ED Notes (Signed)
ED MD and Vincente Liberty RN informed of Lactic Acid 5.9

## 2020-07-27 NOTE — ED Notes (Signed)
NS 1L via pressure bag- v.o. Dr. Ron Parker

## 2020-07-27 NOTE — ED Triage Notes (Signed)
Ongoing SOB. Seen 2 days ago for same. Denies pain.

## 2020-07-28 ENCOUNTER — Inpatient Hospital Stay (HOSPITAL_COMMUNITY): Payer: Medicare HMO

## 2020-07-28 DIAGNOSIS — G9340 Encephalopathy, unspecified: Secondary | ICD-10-CM

## 2020-07-28 DIAGNOSIS — J9601 Acute respiratory failure with hypoxia: Principal | ICD-10-CM

## 2020-07-28 DIAGNOSIS — I469 Cardiac arrest, cause unspecified: Secondary | ICD-10-CM

## 2020-07-28 LAB — MAGNESIUM
Magnesium: 1.6 mg/dL — ABNORMAL LOW (ref 1.7–2.4)
Magnesium: 2.3 mg/dL (ref 1.7–2.4)
Magnesium: 2.5 mg/dL — ABNORMAL HIGH (ref 1.7–2.4)
Magnesium: 2.8 mg/dL — ABNORMAL HIGH (ref 1.7–2.4)

## 2020-07-28 LAB — COMPREHENSIVE METABOLIC PANEL
ALT: 120 U/L — ABNORMAL HIGH (ref 0–44)
AST: 184 U/L — ABNORMAL HIGH (ref 15–41)
Albumin: 3.1 g/dL — ABNORMAL LOW (ref 3.5–5.0)
Alkaline Phosphatase: 130 U/L — ABNORMAL HIGH (ref 38–126)
Anion gap: 17 — ABNORMAL HIGH (ref 5–15)
BUN: 45 mg/dL — ABNORMAL HIGH (ref 8–23)
CO2: 19 mmol/L — ABNORMAL LOW (ref 22–32)
Calcium: 8.2 mg/dL — ABNORMAL LOW (ref 8.9–10.3)
Chloride: 104 mmol/L (ref 98–111)
Creatinine, Ser: 1.89 mg/dL — ABNORMAL HIGH (ref 0.44–1.00)
GFR calc Af Amer: 28 mL/min — ABNORMAL LOW (ref >60)
GFR calc non Af Amer: 24 mL/min — ABNORMAL LOW (ref >60)
Glucose, Bld: 230 mg/dL — ABNORMAL HIGH (ref 70–99)
Potassium: 3.8 mmol/L (ref 3.5–5.1)
Sodium: 140 mmol/L (ref 135–145)
Total Bilirubin: 1.4 mg/dL — ABNORMAL HIGH (ref 0.3–1.2)
Total Protein: 5.4 g/dL — ABNORMAL LOW (ref 6.5–8.1)

## 2020-07-28 LAB — CBC
HCT: 29.2 % — ABNORMAL LOW (ref 36.0–46.0)
HCT: 30.1 % — ABNORMAL LOW (ref 36.0–46.0)
Hemoglobin: 9.4 g/dL — ABNORMAL LOW (ref 12.0–15.0)
Hemoglobin: 10.1 g/dL — ABNORMAL LOW (ref 12.0–15.0)
MCH: 27.8 pg (ref 26.0–34.0)
MCH: 28.8 pg (ref 26.0–34.0)
MCHC: 32.2 g/dL (ref 30.0–36.0)
MCHC: 33.6 g/dL (ref 30.0–36.0)
MCV: 86.4 fL (ref 80.0–100.0)
MCV: 85.8 fL (ref 80.0–100.0)
Platelets: 202 10*3/uL (ref 150–400)
Platelets: 233 10*3/uL (ref 150–400)
RBC: 3.38 MIL/uL — ABNORMAL LOW (ref 3.87–5.11)
RBC: 3.51 MIL/uL — ABNORMAL LOW (ref 3.87–5.11)
RDW: 16.5 % — ABNORMAL HIGH (ref 11.5–15.5)
RDW: 16.6 % — ABNORMAL HIGH (ref 11.5–15.5)
WBC: 12.8 10*3/uL — ABNORMAL HIGH (ref 4.0–10.5)
WBC: 12.5 10*3/uL — ABNORMAL HIGH (ref 4.0–10.5)
nRBC: 0 % (ref 0.0–0.2)
nRBC: 0.2 % (ref 0.0–0.2)

## 2020-07-28 LAB — BASIC METABOLIC PANEL
Anion gap: 20 — ABNORMAL HIGH (ref 5–15)
BUN: 45 mg/dL — ABNORMAL HIGH (ref 8–23)
CO2: 20 mmol/L — ABNORMAL LOW (ref 22–32)
Calcium: 8.5 mg/dL — ABNORMAL LOW (ref 8.9–10.3)
Chloride: 103 mmol/L (ref 98–111)
Creatinine, Ser: 2.04 mg/dL — ABNORMAL HIGH (ref 0.44–1.00)
GFR calc Af Amer: 25 mL/min — ABNORMAL LOW (ref >60)
GFR calc non Af Amer: 22 mL/min — ABNORMAL LOW (ref >60)
Glucose, Bld: 216 mg/dL — ABNORMAL HIGH (ref 70–99)
Potassium: 3.7 mmol/L (ref 3.5–5.1)
Sodium: 143 mmol/L (ref 135–145)

## 2020-07-28 LAB — TROPONIN I (HIGH SENSITIVITY): Troponin I (High Sensitivity): 61 ng/L — ABNORMAL HIGH (ref <18)

## 2020-07-28 LAB — GLUCOSE, CAPILLARY
Glucose-Capillary: 38 mg/dL — CL (ref 70–99)
Glucose-Capillary: 209 mg/dL — ABNORMAL HIGH (ref 70–99)
Glucose-Capillary: 175 mg/dL — ABNORMAL HIGH (ref 70–99)
Glucose-Capillary: 163 mg/dL — ABNORMAL HIGH (ref 70–99)
Glucose-Capillary: 189 mg/dL — ABNORMAL HIGH (ref 70–99)
Glucose-Capillary: 123 mg/dL — ABNORMAL HIGH (ref 70–99)
Glucose-Capillary: 89 mg/dL (ref 70–99)
Glucose-Capillary: 73 mg/dL (ref 70–99)

## 2020-07-28 LAB — ECHOCARDIOGRAM COMPLETE
AR max vel: 1.39 cm2
AV Area VTI: 1.36 cm2
AV Area mean vel: 1.33 cm2
AV Mean grad: 3 mmHg
AV Peak grad: 6 mmHg
Ao pk vel: 1.22 m/s
Calc EF: 38.6 %
MV M vel: 5.46 m/s
MV Peak grad: 119.2 mmHg
Radius: 0.7 cm
S' Lateral: 3.4 cm
Single Plane A2C EF: 37.1 %
Single Plane A4C EF: 39.4 %
Weight: 1834.23 oz

## 2020-07-28 LAB — BRAIN NATRIURETIC PEPTIDE: B Natriuretic Peptide: 1977.2 pg/mL — ABNORMAL HIGH (ref 0.0–100.0)

## 2020-07-28 LAB — APTT
aPTT: >200 seconds (ref 24–36)
aPTT: >200 seconds (ref 24–36)

## 2020-07-28 LAB — TSH: TSH: 5.642 u[IU]/mL — ABNORMAL HIGH (ref 0.350–4.500)

## 2020-07-28 LAB — POCT I-STAT 7, (LYTES, BLD GAS, ICA,H+H)
Acid-Base Excess: 0 mmol/L (ref 0.0–2.0)
Bicarbonate: 20 mmol/L (ref 20.0–28.0)
Calcium, Ion: 1.04 mmol/L — ABNORMAL LOW (ref 1.15–1.40)
HCT: 32 % — ABNORMAL LOW (ref 36.0–46.0)
Hemoglobin: 10.9 g/dL — ABNORMAL LOW (ref 12.0–15.0)
O2 Saturation: 100 %
Patient temperature: 37.6
Potassium: 3.4 mmol/L — ABNORMAL LOW (ref 3.5–5.1)
Sodium: 141 mmol/L (ref 135–145)
TCO2: 21 mmol/L — ABNORMAL LOW (ref 22–32)
pCO2 arterial: 20.8 mmHg — ABNORMAL LOW (ref 32.0–48.0)
pH, Arterial: 7.593 — ABNORMAL HIGH (ref 7.350–7.450)
pO2, Arterial: 169 mmHg — ABNORMAL HIGH (ref 83.0–108.0)

## 2020-07-28 LAB — HEPARIN LEVEL (UNFRACTIONATED)
Heparin Unfractionated: <0.1 IU/mL — ABNORMAL LOW (ref 0.30–0.70)
Heparin Unfractionated: >2.2 IU/mL — ABNORMAL HIGH (ref 0.30–0.70)
Heparin Unfractionated: >2.2 [IU]/mL — ABNORMAL HIGH (ref 0.30–0.70)

## 2020-07-28 LAB — PHOSPHORUS
Phosphorus: 3.6 mg/dL (ref 2.5–4.6)
Phosphorus: 3.8 mg/dL (ref 2.5–4.6)
Phosphorus: 4.1 mg/dL (ref 2.5–4.6)
Phosphorus: 6 mg/dL — ABNORMAL HIGH (ref 2.5–4.6)

## 2020-07-28 LAB — LACTIC ACID, PLASMA
Lactic Acid, Venous: 3.7 mmol/L (ref 0.5–1.9)
Lactic Acid, Venous: 2.6 mmol/L (ref 0.5–1.9)

## 2020-07-28 LAB — HEMOGLOBIN A1C
Hgb A1c MFr Bld: 6.2 % — ABNORMAL HIGH (ref 4.8–5.6)
Mean Plasma Glucose: 131.24 mg/dL

## 2020-07-28 LAB — MRSA PCR SCREENING: MRSA by PCR: NEGATIVE

## 2020-07-28 LAB — CK: Total CK: 54 U/L (ref 38–234)

## 2020-07-28 LAB — CORTISOL: Cortisol, Plasma: 91.6 ug/dL

## 2020-07-28 MED ORDER — AMIODARONE LOAD VIA INFUSION
150.0000 mg | Freq: Once | INTRAVENOUS | Status: AC
  Administered 2020-07-28: 150 mg via INTRAVENOUS
  Filled 2020-07-28: qty 83.34

## 2020-07-28 MED ORDER — POTASSIUM CHLORIDE 20 MEQ/15ML (10%) PO SOLN
40.0000 meq | ORAL | Status: AC
  Administered 2020-07-28 (×2): 40 meq via ORAL
  Filled 2020-07-28 (×2): qty 30

## 2020-07-28 MED ORDER — AMIODARONE HCL IN DEXTROSE 360-4.14 MG/200ML-% IV SOLN
30.0000 mg/h | INTRAVENOUS | Status: DC
  Administered 2020-07-28 – 2020-07-29 (×3): 30 mg/h via INTRAVENOUS
  Filled 2020-07-28 (×3): qty 200

## 2020-07-28 MED ORDER — SODIUM CHLORIDE 0.9 % IV BOLUS
1000.0000 mL | Freq: Once | INTRAVENOUS | Status: AC
  Administered 2020-07-28: 1000 mL via INTRAVENOUS

## 2020-07-28 MED ORDER — AMIODARONE HCL IN DEXTROSE 360-4.14 MG/200ML-% IV SOLN
60.0000 mg/h | INTRAVENOUS | Status: AC
  Administered 2020-07-28 (×2): 60 mg/h via INTRAVENOUS
  Filled 2020-07-28 (×2): qty 200

## 2020-07-28 MED ORDER — CHLORHEXIDINE GLUCONATE CLOTH 2 % EX PADS
6.0000 | MEDICATED_PAD | Freq: Every day | CUTANEOUS | Status: DC
  Administered 2020-07-28 – 2020-08-01 (×3): 6 via TOPICAL

## 2020-07-28 MED ORDER — SODIUM CHLORIDE 0.9 % IV SOLN: INTRAVENOUS | Status: DC | PRN

## 2020-07-28 MED ORDER — CHLORHEXIDINE GLUCONATE 0.12 % MT SOLN
OROMUCOSAL | Status: AC
  Administered 2020-07-28: 15 mL via OROMUCOSAL
  Filled 2020-07-28: qty 15

## 2020-07-28 MED ORDER — HEPARIN (PORCINE) 25000 UT/250ML-% IV SOLN
500.0000 [IU]/h | INTRAVENOUS | Status: DC
  Administered 2020-07-28: 500 [IU]/h via INTRAVENOUS

## 2020-07-28 MED ORDER — INSULIN ASPART 100 UNIT/ML ~~LOC~~ SOLN
0.0000 [IU] | SUBCUTANEOUS | Status: DC
  Administered 2020-07-28: 3 [IU] via SUBCUTANEOUS
  Administered 2020-07-28 – 2020-07-29 (×2): 2 [IU] via SUBCUTANEOUS
  Administered 2020-07-29 – 2020-07-30 (×2): 3 [IU] via SUBCUTANEOUS
  Administered 2020-07-30 – 2020-07-31 (×3): 2 [IU] via SUBCUTANEOUS

## 2020-07-28 MED ORDER — VITAL HIGH PROTEIN PO LIQD
1000.0000 mL | ORAL | Status: DC
  Administered 2020-07-28: 1000 mL

## 2020-07-28 MED ORDER — ACETAMINOPHEN 325 MG PO TABS
650.0000 mg | ORAL_TABLET | ORAL | Status: DC
  Administered 2020-07-28 – 2020-08-02 (×20): 650 mg via ORAL
  Filled 2020-07-28 (×21): qty 2

## 2020-07-28 MED ORDER — MAGNESIUM SULFATE 4 GM/100ML IV SOLN
4.0000 g | Freq: Once | INTRAVENOUS | Status: AC
  Administered 2020-07-28: 4 g via INTRAVENOUS
  Filled 2020-07-28: qty 100

## 2020-07-28 MED ORDER — PROSOURCE TF PO LIQD
45.0000 mL | Freq: Two times a day (BID) | ORAL | Status: DC
  Administered 2020-07-28 – 2020-07-29 (×3): 45 mL
  Filled 2020-07-28 (×3): qty 45

## 2020-07-28 NOTE — Progress Notes (Signed)
EEG complete - results pending 

## 2020-07-28 NOTE — Progress Notes (Signed)
ANTICOAGULATION CONSULT NOTE - Follow Up Consult  Pharmacy Consult for heparin Indication: Afib and r/o ACS  Labs: Recent Labs    07/25/20 1332 07/25/20 1523 07/27/20 1250 07/27/20 1548 07/27/20 1811 07/27/20 2053 07/27/20 2058 07/28/20 0110 07/28/20 0110 07/28/20 0333 07/28/20 0511  HGB 10.7*   < > 10.3*  --    < >   < >  --  9.4*   < > 10.9* 10.1*  HCT 33.6*   < > 32.8*  --    < >   < >  --  29.2*  --  32.0* 30.1*  PLT 231   < > 221  --   --   --   --  202  --   --  233  APTT  --   --   --   --   --   --  27  --   --   --   --   HEPARINUNFRC  --   --   --   --   --   --  1.60*  --   --   --  <0.10*  CREATININE 1.64*  --  1.90*  --   --   --   --  1.89*  --   --   --   TROPONINIHS 29*   < > 26* 21*  --   --   --  61*  --   --   --    < > = values in this interval not displayed.    Assessment: 86yo female subtherapeutic on heparin with initial dosing for Afib and possible ACS.  Goal of Therapy:  Heparin level 0.3-0.7 units/ml   Plan:  Will increase heparin gtt by 4 units/kg/hr to 800 units/hr and check level in 8 hours.    Wynona Neat, PharmD, BCPS  07/28/2020,6:28 AM

## 2020-07-28 NOTE — Procedures (Signed)
History: 86 year old female status post cardiac arrest and now intubated.  Sedation: None  Technique: This is a 21 channel routine scalp EEG performed at the bedside with bipolar and monopolar montages arranged in accordance to the international 10/20 system of electrode placement. One channel was dedicated to EKG recording.    Background: The background consists predominantly of generalized irregular delta and theta range activities.  There is a posterior dominant rhythm which attenuates with eye opening but is poorly sustained, with a frequency of 9 Hz.  No epileptiform discharges or seizures were seen on the study.  Photic stimulation: Physiologic driving is not performed  EEG Abnormalities: 1) generalized irregular slow activity  Clinical Interpretation: This  EEG is consistent with a nonspecific generalized cerebral dysfunction (encephalopathy).  There was no seizure or seizure predisposition recorded on this study. Please note that lack of epileptiform activity on EEG does not preclude the possibility of epilepsy.   Roland Rack, MD [facility] [phone]  If 7pm- 7am, please page neurology on call as listed in [place].

## 2020-07-28 NOTE — Progress Notes (Signed)
  Echocardiogram 2D Echocardiogram has been performed.  Shawna Curry 07/28/2020, 10:28 AM

## 2020-07-28 NOTE — Procedures (Signed)
Arterial Catheter Insertion Procedure Note  Shawna Curry  [id number]  1933/07/01  Date:07/28/20  Time:5:13 PM    Provider Performing: Dimple Nanas    Procedure: Insertion of Arterial Line [phone]) without US guidance  Indication(s) Blood pressure monitoring and/or need for frequent ABGs  Consent Risks of the procedure as well as the alternatives and risks of each were explained to the patient and/or caregiver.  Consent for the procedure was obtained and is signed in the bedside chart  Anesthesia None   Time Out Verified patient identification, verified procedure, site/side was marked, verified correct patient position, special equipment/implants available, medications/allergies/relevant history reviewed, required imaging and test results available.   Sterile Technique Maximal sterile technique including full sterile barrier drape, hand hygiene, sterile gown, sterile gloves, mask, hair covering, sterile ultrasound probe cover (if used).   Procedure Description Area of catheter insertion was cleaned with chlorhexidine and draped in sterile fashion. With real-time ultrasound guidance an arterial catheter was placed into the right radial artery.  Appropriate arterial tracings confirmed on monitor.     Complications/Tolerance None; patient tolerated the procedure well.   EBL Minimal   Specimen(s) None

## 2020-07-28 NOTE — Progress Notes (Signed)
ANTICOAGULATION CONSULT NOTE  Pharmacy Consult for heparin Indication: chest pain/ACS  No Known Allergies  Patient Measurements: Weight: 52 kg (114 lb 10.2 oz) Heparin Dosing Weight: 48kg  Vital Signs: Temp: 97.5 F (36.4 C) (08/22 1600) BP: 82/61 (08/22 1600) Pulse Rate: 34 (08/22 1600)  Labs: Recent Labs    07/27/20 1250 07/27/20 1548 07/27/20 1811 07/27/20 2053 07/27/20 2058 07/28/20 0110 07/28/20 0110 07/28/20 0333 07/28/20 0511 07/28/20 0653 07/28/20 1044 07/28/20 1517  HGB 10.3*  --    < >   < >  --  9.4*   < > 10.9* 10.1*  --   --   --   HCT 32.8*  --    < >   < >  --  29.2*  --  32.0* 30.1*  --   --   --   PLT 221  --   --   --   --  202  --   --  233  --   --   --   APTT  --   --   --   --  27  --   --   --   --   --   --  >200*  HEPARINUNFRC  --   --   --   --  1.60*  --   --   --  <0.10*  --   --  >2.20*  CREATININE 1.90*  --   --   --   --  1.89*  --   --   --  2.04*  --   --   CKTOTAL  --   --   --   --   --   --   --   --   --   --  54  --   TROPONINIHS 26* 21*  --   --   --  61*  --   --   --   --   --   --    < > = values in this interval not displayed.    Estimated Creatinine Clearance: 16.6 mL/min (A) (by C-G formula based on SCr of 2.04 mg/dL (H)).   Medical History: Past Medical History:  Diagnosis Date  . Atrial fibrillation ([place])   . Hypertension     Assessment: 86 year old woman with hx of CAD, hx of cardiac stent, Atrial fibrillation, HFpEF, HLD, HTN, PVD presented with sob, subsequently suffered PEA arrest and received intubation and CPR in ED.   Noted history of afib, anticoagulation note from 07/23/20 indicates that she was on apixaban prior to admit baseline elevated heparin level confirms.   Heparin level undetectable this morning, rate increased and now heparin level and aptt well above goal x2 after stat recheck. No overt bleeding noted by nursing. Discussed with nurse, will hold heparin for 2 hours and adjust rate.   Goal of  Therapy:  Heparin level 0.3-0.7 units/ml aPTT 66-102 seconds Monitor platelets by anticoagulation protocol: Yes   Plan:  Hold heparin 2 hours Restart heparin infusion at 500 units/hr Check anti-Xa level and aptt in 8 hours  Erin Hearing PharmD., BCPS Clinical Pharmacist 07/28/2020 4:55 PM

## 2020-07-28 NOTE — Progress Notes (Signed)
NAMEJolana Curry, MRN:  [id number], DOB:  1933-09-10, LOS: 1 ADMISSION DATE:  07/27/2020, CONSULTATION DATE:  07/27/20 REFERRING MD:  ED, CHIEF COMPLAINT:  SOB   Brief History    86 year old woman with hx of CAD, hx of cardiac stent, Atrial fibrillation, HFpEF, HLD, HTN, PVD, here with shortness of breath, PEA cardiac arrest following sudden hypotension and intubation in ED prior to transfer to [place].  History of present illness   Presented to ED today for SOB.  Was seen 8/19 for SOB in ED, effusion and atelectasis noted, given extra dose lasix, sent home.  Returned with SOB again on 8/21 late am, Found to have lactate 5.9.  Was initially hypotensive to 78H systolic and hypothermic, but responded to fluids initially (1L pernote).  Mild AKI.    started on ceftriaxone and azithromycin (poss infiltrated on CT from 8/19) Some Hypotension in the afternoon, increased SOB, sat not reading, 2L placed on her.  She became unresponsive.  BP dropped, IV fluids started, pressors started, developed PEA cardiac arrest.  Intubated immediately and sedated.  Unclear duration of code ("epinephrine was bolused several times") Was breathing over and moving spontaneously after arrest.   ED called "cardiology attending on call for STEMI's because EKG showed inverted T waves in V5 V6 with 1 mm of elevation in V4.  He does not feel there is emergent catheterization needed at this time.  PR aspirin is given"  ET tube and OG tube placement confirmed prior to transfer.   Past Medical History  CAD, hx of cardiac stent, HTN, Atrial fibrillation, HFpEF, HLD,  Chronic R foot wound (vascular?) Chronic toe ulcers from PVD ( R big toe and second toe) Hx hysterectomy Hand tendon surgery R hip replacement  Cervical carotic arterial stent  Lipitor, plavix, zetia, iron sulfate, folate, lisinopril , lovaza, risendronate, topamax, ELIQUIS  Significant Hospital Events   PEA cardiac arrest 8/21, intubation  8/21  Consults:    Procedures:  Intubation 8/21   Significant Diagnostic Tests:   Cr 1.64 -->1.9, BUN 42-->51 BNP 2010 Trop 26-->21  Micro Data:    Antimicrobials:  azithro ceftriaxone x 1 8/21 Cefepime 8/21-->   vanc 8/21 -->  Interim history/subjective:  Awake but not following commands. HD stable. Becomes tachycardic, hypertensive with stimulation  Objective   Blood pressure (!) 113/100, pulse (!) 111, temperature 98.6 F (37 C), resp. rate 15, weight 52 kg, SpO2 95 %.    Vent Mode: PRVC FiO2 (%):  [40 %-80 %] 40 % Set Rate:  [14 bmp] 14 bmp Vt Set:  [520 mL] 520 mL PEEP:  [5 cmH20] 5 cmH20 Plateau Pressure:  [18 cmH20-21 cmH20] 18 cmH20   Intake/Output Summary (Last 24 hours) at 07/28/2020 8850 Last data filed at 07/28/2020 0600 Gross per 24 hour  Intake 2843.98 ml  Output 410 ml  Net 2433.98 ml   Filed Weights   07/28/20 0053 07/28/20 0355  Weight: 52 kg 52 kg    Examination: GEN: elderly woman in bed on vent HEENT: ETT in place, minimal secretions CV: RRR, ext warm PULM: Scattered rhonci, no accessory muscle use GI: Soft, hypoactive bs EXT: muscle wasting, no edema NEURO: diffuse muscle rigidity, not following commands, not tracking, blinks to threat PSYCH: cannot assess SKIN: no rashes  Cr stable Echo pending Stable CBC  Resolved Hospital Problem list     Assessment & Plan:  S/p PEA cardiac arrest concurrent with hypotensive episode and acute dyspnea. Labs benign with exception  of elevated BNP. No obvious infection. Duration of CPR unclear. CT Head reassuring. Echo pending, per NG, LV mildly down, RV okay - Maintain normal temp with standing tylenol, BP - Limit sedation - f/u echo, check LE duplex but unlikely VTE given home eliquis use and bedside echo - D/C abx, CXR/UA okay, unclear what we are treating, monitor fever curve  Muscle rigidity, encephalopathy post arrest - Check EEG, CK  Afib: on eliquis at home.  - Heparin gtt  while acutely ill.  - May consider BB depending on BPs  AKI: cr up to 1.9. Oliguric   - Check CK, will consider hydration pending echo and this  Best practice:  Diet: start TF Pain/Anxiety/Delirium protocol (if indicated): prn  VAP protocol (if indicated): yes DVT prophylaxis: heparin gtt GI prophylaxis: ppi Glucose control: SSI Mobility: bed Code Status: full Family Communication: will update when they come in Disposition: ICU    The patient is critically ill with multiple organ systems failure and requires high complexity decision making for assessment and support, frequent evaluation and titration of therapies, application of advanced monitoring technologies and extensive interpretation of multiple databases. Critical Care Time devoted to patient care services described in this note independent of APP/resident time (if applicable)  is 43 minutes.   Erskine Emery MD [facility] 07/28/2020 7:14 AM Personal pager: [phone] If unanswered, please page CCM On-call: [phone]

## 2020-07-28 NOTE — Progress Notes (Signed)
[facility] Progress Note Patient Name: Shawna Curry DOB: 02-16-1933 MRN: [id number]   Date of Service  07/28/2020  HPI/Events of Note  Lactic Acid = 5.9 --> 2.8 --> 3.7 in setting of post cardiac arrest. BP = 123/79.   eICU Interventions  Plan: 1. Bolus with 0.9 NaCl 1 liter IV over 1 hour now.      Intervention Category Major Interventions: Acid-Base disturbance - evaluation and management  [name] 07/28/2020, 3:14 AM

## 2020-07-28 NOTE — Progress Notes (Signed)
[facility] Progress Note Patient Name: Shawna Curry DOB: 1933-04-03 MRN: [id number]   Date of Service  07/28/2020  HPI/Events of Note  AFIB with RVR - Ventricular rate = 124. BP = 156/103. CMP and Mg++ level in process in lab.   eICU Interventions  Plan: 1. Amiodarone IV load and infusion.      Intervention Category Major Interventions: Arrhythmia - evaluation and management  Wynne Rozak [place] 07/28/2020, 1:31 AM

## 2020-07-28 NOTE — Progress Notes (Signed)
Patient becoming more awake through day, now tracking, occasionally moving purposefully which is reassuring.  Bedside US does show a small L effusion, not sure draining it will help patient much but am team can consider if she fails SBT.

## 2020-07-29 ENCOUNTER — Inpatient Hospital Stay (HOSPITAL_COMMUNITY): Payer: Medicare HMO

## 2020-07-29 DIAGNOSIS — D72825 Bandemia: Secondary | ICD-10-CM

## 2020-07-29 DIAGNOSIS — N179 Acute kidney failure, unspecified: Secondary | ICD-10-CM

## 2020-07-29 DIAGNOSIS — Z9911 Dependence on respirator [ventilator] status: Secondary | ICD-10-CM

## 2020-07-29 DIAGNOSIS — I5021 Acute systolic (congestive) heart failure: Secondary | ICD-10-CM

## 2020-07-29 LAB — GLUCOSE, CAPILLARY
Glucose-Capillary: 76 mg/dL (ref 70–99)
Glucose-Capillary: 118 mg/dL — ABNORMAL HIGH (ref 70–99)
Glucose-Capillary: 143 mg/dL — ABNORMAL HIGH (ref 70–99)
Glucose-Capillary: 188 mg/dL — ABNORMAL HIGH (ref 70–99)
Glucose-Capillary: 115 mg/dL — ABNORMAL HIGH (ref 70–99)
Glucose-Capillary: 130 mg/dL — ABNORMAL HIGH (ref 70–99)

## 2020-07-29 LAB — CBC
HCT: 33.6 % — ABNORMAL LOW (ref 36.0–46.0)
HCT: 29.9 % — ABNORMAL LOW (ref 36.0–46.0)
Hemoglobin: 10.2 g/dL — ABNORMAL LOW (ref 12.0–15.0)
Hemoglobin: 9.4 g/dL — ABNORMAL LOW (ref 12.0–15.0)
MCH: 28.1 pg (ref 26.0–34.0)
MCH: 27.9 pg (ref 26.0–34.0)
MCHC: 30.4 g/dL (ref 30.0–36.0)
MCHC: 31.4 g/dL (ref 30.0–36.0)
MCV: 92.6 fL (ref 80.0–100.0)
MCV: 88.7 fL (ref 80.0–100.0)
Platelets: 209 10*3/uL (ref 150–400)
Platelets: 198 10*3/uL (ref 150–400)
RBC: 3.63 MIL/uL — ABNORMAL LOW (ref 3.87–5.11)
RBC: 3.37 MIL/uL — ABNORMAL LOW (ref 3.87–5.11)
RDW: 17.5 % — ABNORMAL HIGH (ref 11.5–15.5)
RDW: 17 % — ABNORMAL HIGH (ref 11.5–15.5)
WBC: 24.5 10*3/uL — ABNORMAL HIGH (ref 4.0–10.5)
WBC: 23.5 10*3/uL — ABNORMAL HIGH (ref 4.0–10.5)
nRBC: 0.2 % (ref 0.0–0.2)
nRBC: 0.3 % — ABNORMAL HIGH (ref 0.0–0.2)

## 2020-07-29 LAB — BASIC METABOLIC PANEL
Anion gap: 19 — ABNORMAL HIGH (ref 5–15)
BUN: 61 mg/dL — ABNORMAL HIGH (ref 8–23)
CO2: 18 mmol/L — ABNORMAL LOW (ref 22–32)
Calcium: 8.5 mg/dL — ABNORMAL LOW (ref 8.9–10.3)
Chloride: 105 mmol/L (ref 98–111)
Creatinine, Ser: 2.8 mg/dL — ABNORMAL HIGH (ref 0.44–1.00)
GFR calc Af Amer: 17 mL/min — ABNORMAL LOW (ref >60)
GFR calc non Af Amer: 15 mL/min — ABNORMAL LOW (ref >60)
Glucose, Bld: 196 mg/dL — ABNORMAL HIGH (ref 70–99)
Potassium: 4.7 mmol/L (ref 3.5–5.1)
Sodium: 142 mmol/L (ref 135–145)

## 2020-07-29 LAB — MAGNESIUM
Magnesium: 2.8 mg/dL — ABNORMAL HIGH (ref 1.7–2.4)
Magnesium: 2.3 mg/dL (ref 1.7–2.4)

## 2020-07-29 LAB — APTT
aPTT: >200 seconds (ref 24–36)
aPTT: 90 seconds — ABNORMAL HIGH (ref 24–36)
aPTT: 66 seconds — ABNORMAL HIGH (ref 24–36)

## 2020-07-29 LAB — PROCALCITONIN: Procalcitonin: 4.55 ng/mL

## 2020-07-29 LAB — PHOSPHORUS
Phosphorus: 7.2 mg/dL — ABNORMAL HIGH (ref 2.5–4.6)
Phosphorus: 3.9 mg/dL (ref 2.5–4.6)

## 2020-07-29 LAB — HEPARIN LEVEL (UNFRACTIONATED): Heparin Unfractionated: >2.2 IU/mL — ABNORMAL HIGH (ref 0.30–0.70)

## 2020-07-29 LAB — BRAIN NATRIURETIC PEPTIDE: B Natriuretic Peptide: >4500 pg/mL — ABNORMAL HIGH (ref 0.0–100.0)

## 2020-07-29 MED ORDER — FUROSEMIDE 10 MG/ML IJ SOLN
80.0000 mg | Freq: Once | INTRAMUSCULAR | Status: AC
  Administered 2020-07-29: 80 mg via INTRAVENOUS
  Filled 2020-07-29: qty 8

## 2020-07-29 MED ORDER — LIP MEDEX EX OINT
TOPICAL_OINTMENT | CUTANEOUS | Status: DC | PRN
  Administered 2020-07-29: 1 via TOPICAL
  Filled 2020-07-29: qty 7

## 2020-07-29 MED ORDER — HEPARIN (PORCINE) 25000 UT/250ML-% IV SOLN
650.0000 [IU]/h | INTRAVENOUS | Status: DC
  Administered 2020-07-29 (×2): 300 [IU]/h via INTRAVENOUS
  Administered 2020-08-01: 550 [IU]/h via INTRAVENOUS
  Filled 2020-07-29 (×2): qty 250

## 2020-07-29 MED ORDER — ORAL CARE MOUTH RINSE
15.0000 mL | Freq: Two times a day (BID) | OROMUCOSAL | Status: DC
  Administered 2020-07-29 – 2020-08-02 (×6): 15 mL via OROMUCOSAL

## 2020-07-29 MED ORDER — CHLORHEXIDINE GLUCONATE 0.12 % MT SOLN
15.0000 mL | Freq: Two times a day (BID) | OROMUCOSAL | Status: DC
  Administered 2020-07-29 – 2020-07-30 (×2): 15 mL via OROMUCOSAL
  Filled 2020-07-29: qty 15

## 2020-07-29 NOTE — Progress Notes (Signed)
No Name for heparin Indication: chest pain/ACS  No Known Allergies  Patient Measurements: Weight: 53.7 kg (118 lb 6.2 oz) Heparin Dosing Weight: 48kg  Vital Signs: Temp: 98.1 F (36.7 C) (08/23 2000) Temp Source: Oral (08/23 2000) BP: 97/62 (08/23 2200) Pulse Rate: 64 (08/23 2200)  Labs: Recent Labs     0000 07/27/20 1250 07/27/20 1548 07/27/20 1811 07/28/20 0110 07/28/20 0333 07/28/20 0511 07/28/20 0511 07/28/20 0653 07/28/20 1044 07/28/20 1517 07/28/20 1517 07/28/20 1759 07/29/20 0236 07/29/20 1049 07/29/20 1351 07/29/20 2215  HGB  --  10.3*  --    < > 9.4*   < > 10.1*   < >  --   --   --   --   --  10.2* 9.4*  --   --   HCT  --  32.8*  --    < > 29.2*   < > 30.1*  --   --   --   --   --   --  33.6* 29.9*  --   --   PLT   < > 221  --   --  202   < > 233  --   --   --   --   --   --  209 198  --   --   APTT   < >  --   --    < >  --   --   --    < >  --   --  >200*   < > >200* >200*  --  90* 66*  HEPARINUNFRC  --   --   --    < >  --   --  <0.10*   < >  --   --  >2.20*  --  >2.20* >2.20*  --   --   --   CREATININE   < > 1.90*  --   --  1.89*  --   --   --  2.04*  --   --   --   --   --   --  2.80*  --   CKTOTAL  --   --   --   --   --   --   --   --   --  54  --   --   --   --   --   --   --   TROPONINIHS  --  26* 21*  --  61*  --   --   --   --   --   --   --   --   --   --   --   --    < > = values in this interval not displayed.    Estimated Creatinine Clearance: 12.5 mL/min (A) (by C-G formula based on SCr of 2.8 mg/dL (H)).   Medical History: Past Medical History:  Diagnosis Date  . Atrial fibrillation ([place])   . Hypertension     Assessment: 86 year old woman with hx of CAD, hx of cardiac stent, Atrial fibrillation, HFpEF, HLD, HTN, PVD presented with sob, subsequently suffered PEA arrest and received intubation and CPR in ED.   Noted history of afib, anticoagulation note from 07/23/20 indicates that she was on  apixaban prior to admit baseline elevated heparin level confirms.   APTT now down to 66 sec (low end of therapeutic) on 300 units/hr. Phlebotomy collecting heparin levels peripherally.Heparin level  remains falsely elevated from recent Eliquis.  Goal of Therapy:  Heparin level 0.3-0.7 units/ml aPTT 66-102 seconds Monitor platelets by anticoagulation protocol: Yes   Plan:  Continue IV heparin at current rate of 300 units/hr Daily aPTT, heparin level, and CBC.  Sherlon Handing, PharmD, BCPS Please see amion for complete clinical pharmacist phone list  07/29/2020 11:06 PM

## 2020-07-29 NOTE — Progress Notes (Addendum)
NAMESuellyn Curry, MRN:  [id number], DOB:  01-Mar-1933, LOS: 2 ADMISSION DATE:  07/27/2020, CONSULTATION DATE:  07/27/20 REFERRING MD:  ED, CHIEF COMPLAINT:  SOB   Brief History    86 year old woman with hx of CAD, hx of cardiac stent, Atrial fibrillation, HFpEF, HLD, HTN, PVD, here with shortness of breath, PEA cardiac arrest following sudden hypotension and intubation in ED prior to transfer to [place].  History of present illness   Presented to ED today for SOB.  Was breathing over and moving spontaneously after arrest.   ED called "cardiology attending on call for STEMI's because EKG showed inverted T waves in V5 V6 with 1 mm of elevation in V4.  He does not feel there is emergent catheterization needed at this time.  PR aspirin is given"  ET tube and OG tube placement confirmed prior to transfer.    Past Medical History  CAD, hx of cardiac stent, HTN, Atrial fibrillation, HFpEF, HLD,  Chronic R foot wound (vascular?) Chronic toe ulcers from PVD ( R big toe and second toe) Hx hysterectomy Hand tendon surgery R hip replacement  Cervical carotic arterial stent  Lipitor, plavix, zetia, iron sulfate, folate, lisinopril , lovaza, risendronate, topamax, ELIQUIS  Significant Hospital Events   Was seen 8/19 for SOB in ED, effusion and atelectasis noted, given extra dose lasix, sent home.  Returned with SOB again on 8/21 late am, PEA cardiac arrest 8/21, intubation 8/21 Found to have lactate 5.9.  Was initially hypotensive to 74Q systolic and hypothermic, but responded to fluids initially (1L per note).  Mild AKI.    started on ceftriaxone and azithromycin (poss infiltrated on CT from 8/19) Some Hypotension in the afternoon, increased SOB, sat not reading, 2L placed on her.  She became unresponsive.  BP dropped, IV fluids started, pressors started, developed PEA cardiac arrest.  Intubated immediately and sedated.  Unclear duration of code ("epinephrine was bolused several  times") 8/22: More awake.  CT of brain without acute injury.  EEG with generalized slowing.  Neurologically improving over the course of the day.  Hemodynamically stable. 8/23: Awake, follows commands, no focal deficits appreciated.  Still having only area over the course of the evening urine bloody appearing spite of irrigation, Foley catheter changed, now with clear urine output.  Maximum temperature 100.2, white blood cell count climbing from 12.5 up to 24.5 noted no antibiotics has been stopped the day prior.  Appears fairly comfortable on pressure support ventilation   Consults:    Procedures:  Intubation 8/21   Significant Diagnostic Tests:  CT brain 8/22 -, showed small vessel changes but no acute insults EEG 8/22: Generalized slowing no seizures ECHO 8/22: EF 40 to 45% with global hypokinesis RV systolic function moderately reduced with enlarged RV size Mildly elevated pulmonary artery pressures Micro Data:  Blood cultures x2 8/21 pending>>> Urine culture 8/23>> Sputum culture 8/23  Antimicrobials:  azithro ceftriaxone x 1 8/21 Cefepime 8/21--> stopped 8/22  vanc 8/21 --> discussed 8/20  Interim history/subjective:   She is awake.  Appears very comfortable on pressure support ventilation Objective   Blood pressure (Abnormal) 144/91, pulse 66, temperature 98.3 F (36.8 C), temperature source Oral, resp. rate (Abnormal) 22, weight 53.7 kg, SpO2 100 %.    Vent Mode: PRVC FiO2 (%):  [40 %] 40 % Set Rate:  [8 bmp] 8 bmp Vt Set:  [520 mL] 520 mL PEEP:  [5 cmH20-10 cmH20] 10 cmH20 Plateau Pressure:  [17 cmH20] 17 cmH20  Intake/Output Summary (Last 24 hours) at 07/29/2020 0944 Last data filed at 07/29/2020 0800 Gross per 24 hour  Intake 707.32 ml  Output 285 ml  Net 422.32 ml   Filed Weights   07/28/20 0053 07/28/20 0355 07/29/20 0500  Weight: 52 kg 52 kg 53.7 kg    Examination: General 86 year old black female currently on pressure support of 5, appears  comfortable HEENT: Normocephalic atraumatic sclera nonicteric does have marked jugular venous distention she is orally intubated mucous membranes are moist  pulmonary: Clear to auscultation, diminished bases, pressure support 5/PEEP 5.  Currently tidal volume in the 5-600 range, absolutely no accessory use appreciated Cardiac: Regular irregular, systolic murmur appreciated Abdomen soft nontender no organomegaly positive bowel sounds GU clear yellow urine Via Foley catheter minimal urine output Neuro awake, follows commands, no focal motor deficits appreciated Extremities are dry, slightly cool, pulses are palpable  Resolved Hospital Problem list     Assessment & Plan:  S/p PEA cardiac arrest concurrent with hypotensive episode and acute dyspnea. Etiology presumably cardiac in nature EF 40 to 45% with global hypokinesis RV systolic function moderately reduced with enlarged RV size Mildly elevated pulmonary artery pressures Plan Continue normothermia Limit sedation Continue telemetry monitoring Awaiting lower extremity Dopplers however doubt thromboembolic disease as she was on Eliquis Trending BNP Cards eval s/p extubation   Acute metabolic encephalopathy with muscle rigidity post arrest -CT of brain with chronic small vessel disease but no intracranial abnormality -Initial EEG showing generalized irregular slowing consistent with nonspecific encephalopathy there was no seizure activity noted She is now following commands, no focal deficits appreciated Plan Continue supportive care Avoid fever Watching for occult infection  Acute hypoxic respiratory failure following cardiac arrest Last chest x-ray was 8/21 and this demonstrated endotracheal tube in satisfactory position Small left and right effusions/airspace disease.  Of note was evaluated bedside with ultrasound on 8/22 noted to have small left effusion Previously mental status with primary barrier to extubation, currently awake  and following commands.  Excellent SBT parameters appreciated Plan Continuing SBT, anticipate extubation later this a.m. Titrate FiO2, pulse oximetry 90% IV Lasix x1 Chest x-ray now VAP bundle while intubated N.p.o. post extubation with slow progression of diet and to continue aspiration/reflux precautions  Afib: on eliquis at home.  Plan Continue heparin while acutely ill Continue telemetry monitoring Cont amiodarone  AKI with oliguria. -Serum creatinine continues to rise As of 8/22, total CKs normal without evidence of rhabdo.  Urine output: Plan Chemistry this a.m., I do not see one for today Renal dose medications Strict intake output Renal US today  Fluid and electrolyte imbalance: Hypomagnesemia and hyperphosphatemia Plan Continue to monitor for now Awaiting a.m. chemistry  Leukocytosis with low-grade temperature -Antibiotics have been discontinued on 8/22 -Did have bibasilar airspace disease but no compelling argument for pneumonia time of presentation Plan Check sputum culture Checking procalcitonin Get urine culture Ideally need to get Foley catheter out however with worsening creatinine and active diuresis would like to continue this at least 1 more day  Best practice:  Diet: start TF Pain/Anxiety/Delirium protocol (if indicated): prn  VAP protocol (if indicated): yes DVT prophylaxis: heparin gtt GI prophylaxis: ppi Glucose control: SSI Mobility: bed Code Status: full Family Communication: will update when they come in Disposition: ICU   My cct 34 minutes  Erick Colace ACNP-BC [place] Pager # [phone] OR # [phone] if no answer

## 2020-07-29 NOTE — Progress Notes (Signed)
[facility] Progress Note Patient Name: Shawna Curry DOB: 01-22-1933 MRN: [id number]   Date of Service  07/29/2020  HPI/Events of Note  Notified that patient had oliguria with 30 cc out in 4 hours. I am also told urine is dark bloody but not changed as compared to yesterday. RN is unable to irrigate/flush the foley catheter and bladder scan shows > 200 cc urine in it. Recent hemoglobin is 10.2 on heparin infusion and urine is not with frank blood.   eICU Interventions  Replace foley catheter , old catheter likely blocked Monitor CBC , will repeat another one at 8 am      Intervention Category Intermediate Interventions: Oliguria - evaluation and management  Margaretmary Lombard 07/29/2020, 3:14 AM

## 2020-07-29 NOTE — Progress Notes (Signed)
ANTICOAGULATION CONSULT NOTE - Follow Up Consult  Pharmacy Consult for heparin Indication: atrial fibrillation  Labs: Recent Labs     0000 07/27/20 1250 07/27/20 1548 07/27/20 1811 07/27/20 2058 07/28/20 0110 07/28/20 0110 07/28/20 0333 07/28/20 0511 07/28/20 0511 07/28/20 0653 07/28/20 1044 07/28/20 1517 07/28/20 1759 07/29/20 0236  HGB  --  10.3*  --    < >   < > 9.4*   < > 10.9* 10.1*  --   --   --   --   --  10.2*  HCT  --  32.8*  --    < >  --  29.2*   < > 32.0* 30.1*  --   --   --   --   --  33.6*  PLT   < > 221  --   --   --  202  --   --  233  --   --   --   --   --  209  APTT   < >  --   --    < >  --   --   --   --   --   --   --   --  >200* >200* >200*  HEPARINUNFRC  --   --   --    < >   < >  --   --   --  <0.10*   < >  --   --  >2.20* >2.20* >2.20*  CREATININE  --  1.90*  --   --   --  1.89*  --   --   --   --  2.04*  --   --   --   --   CKTOTAL  --   --   --   --   --   --   --   --   --   --   --  54  --   --   --   TROPONINIHS  --  26* 21*  --   --  61*  --   --   --   --   --   --   --   --   --    < > = values in this interval not displayed.    Assessment: 86yo female remains supratherapeutic on heparin after rate change; labs being drawn from A-line, CBC stable.  Goal of Therapy:  aPTT 66-102 seconds   Plan:  Will hold heparin gtt x2h and decrease heparin gtt by 4 units/kg/hr to 300 units/hr and check PTT in 8 hours.    Wynona Neat, PharmD, BCPS  07/29/2020,3:47 AM

## 2020-07-29 NOTE — Progress Notes (Signed)
ANTICOAGULATION CONSULT NOTE  Pharmacy Consult for heparin Indication: chest pain/ACS  No Known Allergies  Patient Measurements: Weight: 53.7 kg (118 lb 6.2 oz) Heparin Dosing Weight: 48kg  Vital Signs: Temp: 99 F (37.2 C) (08/23 1131) Temp Source: Oral (08/23 1131) BP: 136/99 (08/23 1430) Pulse Rate: 79 (08/23 1430)  Labs: Recent Labs     0000 07/27/20 1250 07/27/20 1548 07/27/20 1811 07/28/20 0110 07/28/20 0333 07/28/20 0511 07/28/20 0511 07/28/20 0653 07/28/20 1044 07/28/20 1517 07/28/20 1517 07/28/20 1759 07/29/20 0236 07/29/20 1049 07/29/20 1351  HGB  --  10.3*  --    < > 9.4*   < > 10.1*   < >  --   --   --   --   --  10.2* 9.4*  --   HCT  --  32.8*  --    < > 29.2*   < > 30.1*  --   --   --   --   --   --  33.6* 29.9*  --   PLT   < > 221  --   --  202   < > 233  --   --   --   --   --   --  209 198  --   APTT   < >  --   --    < >  --   --   --   --   --   --  >200*   < > >200* >200*  --  90*  HEPARINUNFRC  --   --   --    < >  --   --  <0.10*   < >  --   --  >2.20*  --  >2.20* >2.20*  --   --   CREATININE   < > 1.90*  --   --  1.89*  --   --   --  2.04*  --   --   --   --   --   --  2.80*  CKTOTAL  --   --   --   --   --   --   --   --   --  54  --   --   --   --   --   --   TROPONINIHS  --  26* 21*  --  61*  --   --   --   --   --   --   --   --   --   --   --    < > = values in this interval not displayed.    Estimated Creatinine Clearance: 12.5 mL/min (A) (by C-G formula based on SCr of 2.8 mg/dL (H)).   Medical History: Past Medical History:  Diagnosis Date  . Atrial fibrillation ([place])   . Hypertension     Assessment: 86 year old woman with hx of CAD, hx of cardiac stent, Atrial fibrillation, HFpEF, HLD, HTN, PVD presented with sob, subsequently suffered PEA arrest and received intubation and CPR in ED.   Noted history of afib, anticoagulation note from 07/23/20 indicates that she was on apixaban prior to admit baseline elevated heparin level  confirms.   Heparin level undetectable this morning, rate increased and now heparin level and aptt well above goal x2 after stat recheck. No overt bleeding noted by nursing. Discussed with nurse, will hold heparin for 2 hours and adjust rate.   APTT remained elevated this  morning, discussed with RN, it appears levels being drawn relatively close to where infusion was running.  Asked phlebotomy to collect heparin levels peripherally = 90 seconds (at goal).  Heparin level remains falsely elevated from recent Eliquis.  Goal of Therapy:  Heparin level 0.3-0.7 units/ml aPTT 66-102 seconds Monitor platelets by anticoagulation protocol: Yes   Plan:  Continue IV heparin at current rate of 300 units/hr Repeat aPTT in 8 hrs. Daily aPTT, CBC.  [place] Crane, Roylene Reason, BCCP Clinical Pharmacist  07/29/2020 3:03 PM   [facility] pharmacy phone numbers are listed on [email or web address]

## 2020-07-29 NOTE — Procedures (Signed)
Extubation Procedure Note  Patient Details:   Name: Shawna Curry DOB: 10/25/1933 MRN: [id number]   Airway Documentation:    Vent end date: 07/29/20 Vent end time: 1130   Evaluation  O2 sats: stable throughout Complications: No apparent complications Patient did tolerate procedure well. Bilateral Breath Sounds: Clear, Diminished   Yes   Pt was extubated to 3L [place] per CCM order. Pt tolerated procedure well with saturations of 100% at this time. Pt was suctioned and had a positive cuff leak prior to extubation. Pt was able to speak afterwards and no stridor was heard at this time. RT will continue to monitor pt status.   Abdirizak Richison A Alaia Lordi 07/29/2020, 11:39 AM

## 2020-07-30 ENCOUNTER — Encounter (HOSPITAL_COMMUNITY): Payer: Self-pay | Admitting: Pulmonary Disease

## 2020-07-30 ENCOUNTER — Other Ambulatory Visit: Payer: Self-pay

## 2020-07-30 ENCOUNTER — Inpatient Hospital Stay (HOSPITAL_COMMUNITY): Payer: Medicare HMO

## 2020-07-30 DIAGNOSIS — I639 Cerebral infarction, unspecified: Secondary | ICD-10-CM

## 2020-07-30 DIAGNOSIS — R5381 Other malaise: Secondary | ICD-10-CM

## 2020-07-30 DIAGNOSIS — R270 Ataxia, unspecified: Secondary | ICD-10-CM

## 2020-07-30 LAB — CBC
HCT: 29.5 % — ABNORMAL LOW (ref 36.0–46.0)
Hemoglobin: 9.8 g/dL — ABNORMAL LOW (ref 12.0–15.0)
MCH: 28.5 pg (ref 26.0–34.0)
MCHC: 33.2 g/dL (ref 30.0–36.0)
MCV: 85.8 fL (ref 80.0–100.0)
Platelets: 190 10*3/uL (ref 150–400)
RBC: 3.44 MIL/uL — ABNORMAL LOW (ref 3.87–5.11)
RDW: 16.5 % — ABNORMAL HIGH (ref 11.5–15.5)
WBC: 18.6 10*3/uL — ABNORMAL HIGH (ref 4.0–10.5)
nRBC: 0.8 % — ABNORMAL HIGH (ref 0.0–0.2)

## 2020-07-30 LAB — COMPREHENSIVE METABOLIC PANEL
ALT: 105 U/L — ABNORMAL HIGH (ref 0–44)
AST: 107 U/L — ABNORMAL HIGH (ref 15–41)
Albumin: 3.3 g/dL — ABNORMAL LOW (ref 3.5–5.0)
Alkaline Phosphatase: 125 U/L (ref 38–126)
Anion gap: 15 (ref 5–15)
BUN: 63 mg/dL — ABNORMAL HIGH (ref 8–23)
CO2: 21 mmol/L — ABNORMAL LOW (ref 22–32)
Calcium: 8.6 mg/dL — ABNORMAL LOW (ref 8.9–10.3)
Chloride: 105 mmol/L (ref 98–111)
Creatinine, Ser: 2.31 mg/dL — ABNORMAL HIGH (ref 0.44–1.00)
GFR calc Af Amer: 22 mL/min — ABNORMAL LOW (ref >60)
GFR calc non Af Amer: 19 mL/min — ABNORMAL LOW (ref >60)
Glucose, Bld: 116 mg/dL — ABNORMAL HIGH (ref 70–99)
Potassium: 3.7 mmol/L (ref 3.5–5.1)
Sodium: 141 mmol/L (ref 135–145)
Total Bilirubin: 1.2 mg/dL (ref 0.3–1.2)
Total Protein: 5.5 g/dL — ABNORMAL LOW (ref 6.5–8.1)

## 2020-07-30 LAB — PROCALCITONIN: Procalcitonin: 4.82 ng/mL

## 2020-07-30 LAB — BRAIN NATRIURETIC PEPTIDE: B Natriuretic Peptide: >4500 pg/mL — ABNORMAL HIGH (ref 0.0–100.0)

## 2020-07-30 LAB — GLUCOSE, CAPILLARY
Glucose-Capillary: 106 mg/dL — ABNORMAL HIGH (ref 70–99)
Glucose-Capillary: 110 mg/dL — ABNORMAL HIGH (ref 70–99)
Glucose-Capillary: 111 mg/dL — ABNORMAL HIGH (ref 70–99)
Glucose-Capillary: 120 mg/dL — ABNORMAL HIGH (ref 70–99)
Glucose-Capillary: 125 mg/dL — ABNORMAL HIGH (ref 70–99)
Glucose-Capillary: 134 mg/dL — ABNORMAL HIGH (ref 70–99)
Glucose-Capillary: 176 mg/dL — ABNORMAL HIGH (ref 70–99)
Glucose-Capillary: 82 mg/dL (ref 70–99)

## 2020-07-30 LAB — URINE CULTURE: Culture: NO GROWTH

## 2020-07-30 LAB — APTT
aPTT: 60 seconds — ABNORMAL HIGH (ref 24–36)
aPTT: 51 seconds — ABNORMAL HIGH (ref 24–36)

## 2020-07-30 LAB — HEPARIN LEVEL (UNFRACTIONATED): Heparin Unfractionated: 1 IU/mL — ABNORMAL HIGH (ref 0.30–0.70)

## 2020-07-30 MED ORDER — AMIODARONE HCL 200 MG PO TABS
400.0000 mg | ORAL_TABLET | Freq: Two times a day (BID) | ORAL | Status: DC
  Administered 2020-07-30 – 2020-07-31 (×3): 400 mg via ORAL
  Filled 2020-07-30 (×3): qty 2

## 2020-07-30 MED ORDER — ATORVASTATIN CALCIUM 40 MG PO TABS
40.0000 mg | ORAL_TABLET | Freq: Every day | ORAL | Status: DC
  Administered 2020-07-30 – 2020-08-02 (×4): 40 mg via ORAL
  Filled 2020-07-30 (×4): qty 1

## 2020-07-30 MED ORDER — GABAPENTIN 600 MG PO TABS
300.0000 mg | ORAL_TABLET | Freq: Two times a day (BID) | ORAL | Status: DC
  Administered 2020-07-30 – 2020-08-02 (×7): 300 mg via ORAL
  Filled 2020-07-30 (×7): qty 1

## 2020-07-30 MED ORDER — DILTIAZEM HCL ER COATED BEADS 120 MG PO CP24
120.0000 mg | ORAL_CAPSULE | Freq: Every day | ORAL | Status: DC
  Administered 2020-07-30: 120 mg via ORAL
  Filled 2020-07-30: qty 1

## 2020-07-30 MED ORDER — ASPIRIN 81 MG PO CHEW
81.0000 mg | CHEWABLE_TABLET | Freq: Every day | ORAL | Status: DC
  Administered 2020-07-30 – 2020-07-31 (×2): 81 mg via ORAL
  Filled 2020-07-30 (×2): qty 1

## 2020-07-30 MED ORDER — METOPROLOL SUCCINATE ER 100 MG PO TB24
100.0000 mg | ORAL_TABLET | Freq: Every day | ORAL | Status: DC
  Administered 2020-07-30 – 2020-07-31 (×2): 100 mg via ORAL
  Filled 2020-07-30: qty 1
  Filled 2020-07-30: qty 2

## 2020-07-30 NOTE — Evaluation (Signed)
Physical Therapy Evaluation Patient Details Name: Shawna Curry MRN: [id number] DOB: 03-26-1933 Today's Date: 07/30/2020   History of Present Illness  86 yo admitted 8/21 with SOB with respiratory failure leading to cardiac arrest post intubation. Extubated 8/23. PMhx: HFpEF, Afib, CAD  Clinical Impression  Pt very pleasant, confused and unaware of place, time, situation or her birth year. Pt normally lives alone but children alternate staying with her throughout the day. Pt with prior CVA per son with RUE weakness but able to use RW and walk throughout house and perform bathing/dressing with supervision. Pt currently exhibiting bil UE and truncal ataxia with inability to stand or transfer out of chair. Pt unaware of deficits and will require extensive rehab prior to home at this current functional level. Pt with decreased cognition, balance, strength, function and safety who will benefit from acute therapy to maximize mobility and safety to decrease burden of care.   137/55 (95) pre, HR 75 159/81 (105) post activity, HR 79    Follow Up Recommendations CIR;Supervision/Assistance - 24 hour    Equipment Recommendations  Other (comment) (TBD)    Recommendations for Other Services OT consult;Rehab consult     Precautions / Restrictions Precautions Precautions: Fall Precaution Comments: truncal and bil UE ataxia      Mobility  Bed Mobility Overal bed mobility: Needs Assistance Bed Mobility: Supine to Sit;Sit to Supine     Supine to sit: Min assist;HOB elevated Sit to supine: Mod assist   General bed mobility comments: HOb 20 degrees with increased time and min assist to fully elevate trunk into sitting. Mod to return to supine  Transfers Overall transfer level: Needs assistance   Transfers: Sit to/from Stand Sit to Stand: Max assist;+2 physical assistance         General transfer comment: attempted to stand x 2 with max +2 but pt with posterior lean, ataxic trunk unable to  hook bil UE with therapist and nurse and not coordinating movement with cues to stand. Will require lift at this time  Ambulation/Gait             General Gait Details: unable  Stairs            Wheelchair Mobility    Modified Rankin (Stroke Patients Only) Modified Rankin (Stroke Patients Only) Pre-Morbid Rankin Score: Moderately severe disability Modified Rankin: Severe disability     Balance Overall balance assessment: Needs assistance Sitting-balance support: Feet supported;No upper extremity supported Sitting balance-Leahy Scale: Poor Sitting balance - Comments: pt with min-max assist for stiting balance with highly varied trunk leaning posteriorly and right without pt awareness of position in space with mild trunk ataxia                                     Pertinent Vitals/Pain Pain Assessment: No/denies pain    Home Living Family/patient expects to be discharged to:: Private residence Living Arrangements: Alone Available Help at Discharge: Family;Available 24 hours/day Type of Home: House Home Access: Ramped entrance     Home Layout: One level Home Equipment: Toilet riser;Walker - 2 wheels;Cane - single point Additional Comments: PLOF and setup provided by son Duane via phone    Prior Function Level of Independence: Needs assistance   Gait / Transfers Assistance Needed: walking with RW with supervision  ADL's / Homemaking Assistance Needed: family assists with housework  Comments: son and daughter alternate staying with pt  Hand Dominance        Extremity/Trunk Assessment   Upper Extremity Assessment Upper Extremity Assessment: RUE deficits/detail;LUE deficits/detail;Difficult to assess due to impaired cognition RUE Deficits / Details: pt with noted weakness with automatic assist of LUE to move RUE. Noted ataxia with decreased spatial perception with tendency to fold wrist under her LUE Deficits / Details: ataxia with all  movements and overshooting    Lower Extremity Assessment Lower Extremity Assessment: Overall WFL for tasks assessed (pt able to perform long arc quads and toe tapping bil EOB on command)    Cervical / Trunk Assessment Cervical / Trunk Assessment: Other exceptions  Communication   Communication: No difficulties  Cognition Arousal/Alertness: Awake/alert Behavior During Therapy: WFL for tasks assessed/performed Overall Cognitive Status: Impaired/Different from baseline Area of Impairment: Memory;Following commands;Awareness;Problem solving;Orientation                 Orientation Level: Disoriented to;Situation;Place;Time   Memory: Decreased short-term memory Following Commands: Follows one step commands inconsistently   Awareness: Intellectual Problem Solving: Slow processing;Decreased initiation;Difficulty sequencing;Requires verbal cues;Requires tactile cues General Comments: pt stating year as "1996" initially stating she has 2 sons and no daughter then later able to state name of son and daughter      General Comments      Exercises     Assessment/Plan    PT Assessment Patient needs continued PT services  PT Problem List Decreased strength;Decreased mobility;Decreased safety awareness;Decreased range of motion;Decreased coordination;Decreased activity tolerance;Decreased cognition;Decreased balance;Decreased knowledge of use of DME       PT Treatment Interventions Gait training;Balance training;Functional mobility training;Cognitive remediation;Therapeutic activities;Patient/family education;Neuromuscular re-education;DME instruction;Therapeutic exercise    PT Goals (Current goals can be found in the Care Plan section)  Acute Rehab PT Goals Patient Stated Goal: be able to walk PT Goal Formulation: With patient/family Time For Goal Achievement: 08/13/20 Potential to Achieve Goals: Fair    Frequency Min 3X/week   Barriers to discharge        Co-evaluation                AM-PAC PT "6 Clicks" Mobility  Outcome Measure Help needed turning from your back to your side while in a flat bed without using bedrails?: A Little Help needed moving from lying on your back to sitting on the side of a flat bed without using bedrails?: A Little Help needed moving to and from a bed to a chair (including a wheelchair)?: Total Help needed standing up from a chair using your arms (e.g., wheelchair or bedside chair)?: Total Help needed to walk in hospital room?: Total Help needed climbing 3-5 steps with a railing? : Total 6 Click Score: 10    End of Session Equipment Utilized During Treatment: Gait belt Activity Tolerance: Patient tolerated treatment well Patient left: in bed;with call bell/phone within reach;with bed alarm set;with nursing/sitter in room Nurse Communication: Mobility status;Need for lift equipment;Precautions PT Visit Diagnosis: Other abnormalities of gait and mobility (R26.89);Difficulty in walking, not elsewhere classified (R26.2);Muscle weakness (generalized) (M62.81);Other symptoms and signs involving the nervous system (R29.898)    Time: 1638-4665 PT Time Calculation (min) (ACUTE ONLY): 20 min   Charges:   PT Evaluation $PT Eval Moderate Complexity: 1 Mod          Franko Hilliker P, PT Acute Rehabilitation Services Pager: [phone] Office: [phone]   Sandy Salaam Lyana Asbill 07/30/2020, 12:02 PM

## 2020-07-30 NOTE — Progress Notes (Signed)
Rehab Admissions Coordinator Note:  Patient was screened by Cleatrice Burke for appropriateness for an Inpatient Acute Rehab Consult per therapy recommendations.   At this time, we are recommending Inpatient Rehab consult. Please place order for rehab consult if you would like pt considered for admit. Please advise.  Cleatrice Burke RN MSN 07/30/2020, 1:55 PM  I can be reached at [phone].

## 2020-07-30 NOTE — Progress Notes (Signed)
NAMERachell Curry, MRN:  [id number], DOB:  1933-12-07, LOS: 3 ADMISSION DATE:  07/27/2020, CONSULTATION DATE:  07/27/20 REFERRING MD:  ED, CHIEF COMPLAINT:  SOB   Brief History    86 year old woman with hx of CAD, hx of cardiac stent, Atrial fibrillation, HFpEF, HLD, HTN, PVD, here with shortness of breath, PEA cardiac arrest following sudden hypotension and intubation in ED prior to transfer to [place].  History of present illness   Presented to ED today for SOB.  Was breathing over and moving spontaneously after arrest.   ED called "cardiology attending on call for STEMI's because EKG showed inverted T waves in V5 V6 with 1 mm of elevation in V4.  He does not feel there is emergent catheterization needed at this time.  PR aspirin is given"  ET tube and OG tube placement confirmed prior to transfer.    Past Medical History  CAD, hx of cardiac stent, HTN, Atrial fibrillation, HFpEF, HLD,  Chronic R foot wound (vascular?) Chronic toe ulcers from PVD ( R big toe and second toe) Hx hysterectomy Hand tendon surgery R hip replacement  Cervical carotic arterial stent  Lipitor, plavix, zetia, iron sulfate, folate, lisinopril , lovaza, risendronate, topamax, ELIQUIS  Significant Hospital Events   Was seen 8/19 for SOB in ED, effusion and atelectasis noted, given extra dose lasix, sent home.  Returned with SOB again on 8/21 late am, PEA cardiac arrest 8/21, intubation 8/21 Found to have lactate 5.9.  Was initially hypotensive to 71I systolic and hypothermic, but responded to fluids initially (1L per note).  Mild AKI.    started on ceftriaxone and azithromycin (poss infiltrated on CT from 8/19) Some Hypotension in the afternoon, increased SOB, sat not reading, 2L placed on her.  She became unresponsive.  BP dropped, IV fluids started, pressors started, developed PEA cardiac arrest.  Intubated immediately and sedated.  Unclear duration of code ("epinephrine was bolused several  times") 8/22: More awake.  CT of brain without acute injury.  EEG with generalized slowing.  Neurologically improving over the course of the day.  Hemodynamically stable. 8/23: Awake, follows commands, no focal deficits appreciated.  Still having only area over the course of the evening urine bloody appearing spite of irrigation, Foley catheter changed, now with clear urine output.  Maximum temperature 100.2, white blood cell count climbing from 12.5 up to 24.5 noted no antibiotics has been stopped the day prior.  Appears fairly comfortable on pressure support ventilation 8/24.  Oxygen weaning down, hemodynamically stable, actually on the hypertensive side.  Amiodarone discontinued, resumed Lopressor and Cardizem home dosing.  Exhibiting some upper extremity ataxia with physical therapy.  MRI brain ordered.  Stable for transfer to stepdown.  Asking heart failure team to evaluate  Consults:    Procedures:  Intubation 8/21   Significant Diagnostic Tests:  CT brain 8/22 -, showed small vessel changes but no acute insults EEG 8/22: Generalized slowing no seizures ECHO 8/22: EF 40 to 45% with global hypokinesis RV systolic function moderately reduced with enlarged RV size Mildly elevated pulmonary artery pressures Micro Data:  Blood cultures x2 8/21 pending>>> Urine culture 8/23>> Sputum culture 8/23  Antimicrobials:  azithro ceftriaxone x 1 8/21 Cefepime 8/21--> stopped 8/22  vanc 8/21 --> discussed 8/20  Interim history/subjective:   No distress  Objective   Blood pressure (Abnormal) 141/102, pulse 71, temperature 98.3 F (36.8 C), temperature source Oral, resp. rate 15, weight 51.1 kg, SpO2 99 %.  Intake/Output Summary (Last 24 hours) at 07/30/2020 0908 Last data filed at 07/30/2020 0800 Gross per 24 hour  Intake 531.59 ml  Output 2440 ml  Net -1908.41 ml   Filed Weights   07/28/20 0355 07/29/20 0500 07/30/20 0500  Weight: 52 kg 53.7 kg 51.1 kg     Examination:  General this is a pleasant 86 year old black female she is resting in bed and currently in no acute distress HEENT normocephalic pes fairly significant jugular venous distention Pulmonary: Faint basilar crackles, no accessory use.  Currently 1 L/min nasal cannula Cardiac: Regular irregular atrial fibrillation.  Soft systolic murmur Abdomen soft nontender Extremities warm dry brisk capillary refill Neuro awake, oriented x1.  Moves all extremities but does exhibit upper extremity ataxia with fairly poor fine motor control, does move all extremities.  Of note has baseline right-sided weakness which is intermittent GU clear yellow  Resolved Hospital Problem list   Acute hypoxic respiratory failure resolved  Assessment & Plan:  S/p PEA cardiac arrest concurrent with hypotensive episode and acute dyspnea. Etiology presumably cardiac in nature EF 40 to 45% with global hypokinesis RV systolic function moderately reduced with enlarged RV size Mildly elevated pulmonary artery pressures Plan Avoiding fever  Continue telemetry monitoring  Resuming her beta-blockade and calcium channel blocker  I am going to ask heart failure team to evaluate Continue to trend BNP Continue IV heparin for now, may consider resuming Eliquis in the next day or so, need to make sure there is no neurological insult such as bleeding given her new ataxic findings  Acute metabolic encephalopathy with muscle rigidity post arrest -CT of brain with chronic small vessel disease but no intracranial abnormality -Initial EEG showing generalized irregular slowing consistent with nonspecific encephalopathy there was no seizure activity noted She is awake, moves all extremities, but has significant ataxia of her upper extremities, previously after her old stroke she had right-sided weakness but no mention of these kind of deficits in review of her prior neurology notes Plan Continuing supportive care  Avoiding  fever  Physical therapy  We will go ahead and obtain MRI of brain  May need neurology input   Afib: on eliquis at home.  Plan Continue IV heparin for now, eventually resume Eliquis discontinue amiodarone, resume her diltiazem and metoprolol  AKI with oliguria and anion gap metabolic acidosis Serum creatinine was continuing to rise as of 8/23, acid-base did look a little worse however hemodynamically stable.  She was hypertensive and therefore received diuretics to facilitate extubation as well -Renal ultrasound was negative for hydronephrosis -Foley catheter had been changed on 8/23 Plan Keep Foley catheter in place for 24 more hours, if creatinine not improving Repeating chemistry this morning Avoid hypotension, treat hypertension Will hold off on further diuresis for now pending blood chemistry results  Leukocytosis with low-grade temperature Antibiotics were stopped on 8/22, procalcitonin indeterminate, white blood cell count remains elevated but improving, no fever Plan Follow-up sputum and urine culture Remove Foley as soon as possible Continue to trend procalcitonin for now  Best practice:  Diet: s advance Pain/Anxiety/Delirium protocol (if indicated): prn discontinued VAP protocol (if indicated): yes: Discontinued DVT prophylaxis: heparin gtt GI prophylaxis: ppi Glucose control: SSI Mobility: bed Code Status: full Family Communication: will update when they come in Disposition: Can go to stepdown unit  Erick Colace ACNP-BC [place] Pager # [phone] OR # [phone] if no answer

## 2020-07-30 NOTE — Consult Note (Addendum)
Cardiology Consultation:   Patient ID: Shawna Curry MRN: [id number]; DOB: 27-Dec-1932  Admit date: 07/27/2020 Date of Consult: 07/30/2020  Primary Care Provider: Iva Curry, [place] Cardiologist: No primary care provider on Curry. new Normally follows with Dr. Marijo Curry with [facility] at [facility] office. [facility] Electrophysiologist:  None    Patient Profile:   Shawna Curry is a 86 y.o. female with a hx of persistent atrial fib, HLD, DM-2, Left sided PCA stroke, PAD CAD -NSTEMI 02/2020 and cath, recent hospitalization for SOB, and atrial fib. who is being seen today for the evaluation of post arrest at the request of Dr. Carlis Curry.  History of Present Illness:   Shawna Curry with above hx and no prior issues until March of this year and last echo 07/11/20 with EF 50-55%, LA and RA are mildly dilated.  Tr AR, mild to mod MR, mild TR mild pulmonary HTN trivial pericardial effusion and improved EF compared to prior study.   Was in a fib 07/12/20 rate controlled.  (older Echo 04/23/20 with EF 40-45%)   CAD with severe disease in distal LAD, chronic total occlusion of RCA.   She has been on plavix and Eliquis.  She was on dilt and toprol, and zetia.  Also with PAD.  DVT pf tibial vein, of Rt lower ext .  Chronic HF with old EF of 45-50% post MI, more recent improved to 50-55%.    Her atrial fib is persistent and in April she dod not wish to try DCCV.    Hx rt CEA, Left 80%--she is on plavix--this is followed by Vascular Surgery  04/22/20 admitted in [facility] with NSTEMI. Also AKI. And acute on chronic systolic HF    Now admitted 07/27/20 with SOB, had been seen in ER on the 19th for same complaint.  The SOB is with exertion, she could sleep flat, but with increased activity her SOB increased - may have been due to increased HR?     In ER on the 21st she had hypotension and then she became unresponsive.  She was intubated  + PEA arrest 5 min of CPR and given epinephrine, bicarb also with  lactic acidosis.   ED called "cardiology attending on call for STEMI's because EKG showed inverted T waves in V5 V6 with 1 mm of elevation in V4.  He does not feel there is emergent catheterization needed at this time.  PR aspirin is given"   She was placed on IV amiodarone for A fib RVR 07/28/20 and about 6-12 hours later converted to junctional then SR.    Now maintaining SR.    EKG:  The EKG was personally reviewed and demonstrates:  Atrial fib with non specific ST abnormalities and up to 1.8 sec pauses then at 1700 or so EKG changed with PEA arrest with deep T wave inversions in lateral leads.   Today EKG SR with improved T wave inversion + PAC Qtc 500 on IV amiodarone.   Telemetry:  Telemetry was personally reviewed and demonstrates:  SR with PACs.  On IV amiodarone.   Echo 07/28/20  EF 40% -45%  with diffuse hypokinesis, mod RV systolic dysfunction moderately reduced, RV size is mod. Enlarged, severe MR, RVSP of 56 mmHg  Mod concentric LVH,  LA severely dilated and RA was moderately dilated.  Large Lt pl effusion  with lung atelectasis.  Severe MR, no MS, moderate TR   Significant change from 07/11/20  Tropnin on admit 29 and 26 follow up 21  and 61 with BNP 2255 on admit today BNP >4500  DDImer 2.57  TSH 5.642  Today Cr 2.31, Na 141, K+ 3.7 AST 107, ALT 105  hgb 9.8, WBC 18.6, plts 190   On IV heparin.  Now extubated On nasal cannula.    BP 145.82 P 70 on amiodarone drip and IV heparin  No pain, her daughter notes she is not acting the same, is worried about another stroke for head MRI   Past Medical History:  Diagnosis Date   Atrial fibrillation ([place])    Coronary artery disease    Hypertension     Past Surgical History:  Procedure Laterality Date   ABDOMINAL HYSTERECTOMY     HAND TENDON SURGERY     JOINT REPLACEMENT     R hip x 2   PERCUTANEOUS PLACEMENT INTRAVASCULAR STENT CERVICAL CAROTID ARTERY     TONSILLECTOMY       Home Medications:  Prior to Admission  medications   Medication Sig Start Date End Date Taking? Authorizing Provider  acetaminophen (TYLENOL) 650 MG CR tablet Take 1,300 mg by mouth daily as needed for pain.   Yes [provider]  alendronate (FOSAMAX) 35 MG tablet Take 35 mg by mouth every Monday. 06/26/20  Yes [provider]  apixaban (ELIQUIS) 2.5 MG TABS tablet Take 2.5 mg by mouth 2 (two) times daily.   Yes [provider]  atorvastatin (LIPITOR) 20 MG tablet Take 20 mg by mouth every evening.  05/25/20  Yes [provider]  clopidogrel (PLAVIX) 75 MG tablet Take 75 mg by mouth daily.   Yes [provider]  diltiazem (CARDIZEM CD) 120 MG 24 hr capsule Take 120 mg by mouth daily. 07/23/20  Yes [provider]  ezetimibe (ZETIA) 10 MG tablet Take 10 mg by mouth at bedtime.    Yes [provider]  folic acid (FOLVITE) 1 MG tablet Take 1 mg by mouth daily.   Yes [provider]  furosemide (LASIX) 20 MG tablet Take 40 mg by mouth daily. 06/27/20  Yes [provider]  gabapentin (NEURONTIN) 100 MG capsule Take 200 mg by mouth 3 (three) times daily. 07/14/20  Yes [provider]  l-methylfolate-B6-B12 (METANX) 3-35-2 MG TABS tablet Take 1 tablet by mouth 2 (two) times daily.   Yes [provider]  latanoprost (XALATAN) 0.005 % ophthalmic solution Place 1 drop into both eyes at bedtime. 06/18/20  Yes [provider]  metFORMIN (GLUCOPHAGE-XR) 500 MG 24 hr tablet Take 500 mg by mouth 2 (two) times daily with a meal. 06/01/20  Yes [provider]  metoprolol succinate (TOPROL-XL) 100 MG 24 hr tablet Take 100 mg by mouth daily. 07/23/20  Yes [provider]    Inpatient Medications: Scheduled Meds:  acetaminophen  650 mg Oral Q4H   chlorhexidine  15 mL Mouth Rinse BID   Chlorhexidine Gluconate Cloth  6 each Topical Daily   docusate  100 mg Per Tube BID   gabapentin  300 mg Oral BID   insulin aspart  0-15 Units  Subcutaneous Q4H   mouth rinse  15 mL Mouth Rinse q12n4p   metoprolol succinate  100 mg Oral Daily   Continuous Infusions:  sodium chloride Stopped (07/27/20 1737)   heparin 350 Units/hr (07/30/20 1200)   PRN Meds: sodium chloride, docusate, lip balm, polyethylene glycol  Allergies:   No Known Allergies  Social History:   Social History   Socioeconomic History   Marital status: Widowed  Spouse name: Not on Curry   Number of children: Not on Curry   Years of education: Not on Curry   Highest education level: Not on Curry  Occupational History   Not on Curry  Tobacco Use   Smoking status: Former Smoker   Smokeless tobacco: Never Used  Substance and Sexual Activity   Alcohol use: Never   Drug use: Never   Sexual activity: Not on Curry  Other Topics Concern   Not on Curry  Social History Narrative   Not on Curry   Social Determinants of Health   Financial Resource Strain:    Difficulty of Paying Living Expenses: Not on Curry  Food Insecurity:    Worried About [place] in the Last Year: Not on Curry   Ran Out of Food in the Last Year: Not on Curry  Transportation Needs:    Lack of Transportation (Medical): Not on Curry   Lack of Transportation (Non-Medical): Not on Curry  Physical Activity:    Days of Exercise per Week: Not on Curry   Minutes of Exercise per Session: Not on Curry  Stress:    Feeling of Stress : Not on Curry  Social Connections:    Frequency of Communication with Friends and Family: Not on Curry   Frequency of Social Gatherings with Friends and Family: Not on Curry   Attends Religious Services: Not on Curry   Active Member of Clubs or Organizations: Not on Curry   Attends Archivist Meetings: Not on Curry   Marital Status: Not on Curry  Intimate Partner Violence:    Fear of Current or Ex-Partner: Not on Curry   Emotionally Abused: Not on Curry   Physically Abused: Not on Curry   Sexually Abused: Not on Curry    Family History:    Family  History  Problem Relation Age of Onset   Diabetes Mother      ROS:  Please see the history of present illness.  General:no colds or fevers, no weight changes Skin:no rashes or ulcers HEENT:no blurred vision, no congestion CV:see HPI PUL:see HPI GI:no diarrhea constipation or melena, no indigestion GU:no hematuria, no dysuria MS:no joint pain, no claudication Neuro:no syncope, no lightheadedness Endo:+ diabetes, no thyroid disease  All other ROS reviewed and negative.     Physical Exam/Data:   Vitals:   07/30/20 1100 07/30/20 1130 07/30/20 1141 07/30/20 1200  BP: (!) 153/77 (!) 145/91  (!) 142/84  Pulse: 80 68  66  Resp: 17 17  17   Temp:   98.6 F (37 C)   TempSrc:   Oral   SpO2: 96% 94%  94%  Weight:        Intake/Output Summary (Last 24 hours) at 07/30/2020 1238 Last data filed at 07/30/2020 1200 Gross per 24 hour  Intake 467.33 ml  Output 2710 ml  Net -2242.67 ml   Last 3 Weights 07/30/2020 07/29/2020 07/28/2020  Weight (lbs) 112 lb 10.5 oz 118 lb 6.2 oz 114 lb 10.2 oz  Weight (kg) 51.1 kg 53.7 kg 52 kg     Body mass index is 17.13 kg/m.  General:  Frail female, in no acute distress, pulling at dinner tray HEENT: normal Lymph: no adenopathy Neck: no JVD sitting up Endocrine:  No thryomegaly Vascular: No carotid bruits; pedal pulses 1+ bilaterally but faint Cardiac:  normal S1, S2; RRR; 2/6 murmur lateral chest Lungs:  diminished to auscultation bilaterally, no wheezing, rhonchi or rales  Abd: soft, nontender, no hepatomegaly  Ext: no edema Musculoskeletal:  No deformities, BUE and BLE strength normal and equal, ulcer Rt foot.   Skin: warm and dry  Neuro:  Alert and oriented- but control of arms slow., no focal abnormalities noted Psych:  Normal affect    Relevant CV Studies: PV angio 04/2020 PV angio RLE Arteriogram:  External iliac without flow limiting disease. CFA artery with focal large plaque causing a significant flow limiting stenosis. Flush SFA  occlusion with distal reconstitution via profunda femoris collaterals. Profunda is widely patent however has sluggish flow. High grade stenosis of the above knee popliteal artery. The remains of the popliteal artery is without flow limiting disease. AT and PT are occluded throughout. Single vessel run off to the foot via the peroneal with some flow into the heel.    Cardiac cath 02/19/20 PROCEDURE:  Coronary angiography, Left heart cath, LV gram   ANGIOGRAM/CORONARY ARTERIOGRAM:    There was  severe disease in the distal left anterior descending.  There was  Chronic total occlusion in the proximal   right coronary  artery, with some collateral filling    LEFT VENTRICULOGRAM:  Left ventriculography was not done   Complications:  None   RECOMMENDATION:  Medical therapy for coronary artery disease risk reduction.  Medical  therapy is favored due to the diffuse calcific nature of her lesions and  her lack of anginal symptoms.  Antiplatelet therapy is appropriate, however will defer to neurology as to  whether to continue Plavix or aspirin in light of probable need for  long-term anticoagulation.   Echo 07/28/20 IMPRESSIONS     1. LVEF ~ 40% with diffuse hypokinesis, moderate RV systolic dysfunction.  Severe mitral regurgitation, RVSP 56 mmHg.   2. Left ventricular ejection fraction, by estimation, is 40 to 45%. The  left ventricle has mildly decreased function. The left ventricle  demonstrates global hypokinesis. There is moderate concentric left  ventricular hypertrophy. Left ventricular  diastolic function could not be evaluated.   3. Right ventricular systolic function is moderately reduced. The right  ventricular size is moderately enlarged. There is moderately elevated  pulmonary artery systolic pressure. The estimated right ventricular  systolic pressure is 28.3 mmHg.   4. Left atrial size was severely dilated.   5. Right atrial size was moderately dilated.   6. Large left  pleural effusion with lung atelectasis.   7. The mitral valve is normal in structure. Severe mitral valve  regurgitation. No evidence of mitral stenosis.   8. Tricuspid valve regurgitation is moderate.   9. The aortic valve is normal in structure. Aortic valve regurgitation is  trivial. Mild to moderate aortic valve sclerosis/calcification is present,  without any evidence of aortic stenosis.  10. The inferior vena cava is normal in size with greater than 50%  respiratory variability, suggesting right atrial pressure of 3 mmHg.   FINDINGS   Left Ventricle: Left ventricular ejection fraction, by estimation, is 40  to 45%. The left ventricle has mildly decreased function. The left  ventricle demonstrates global hypokinesis. The left ventricular internal  cavity size was normal in size. There is   moderate concentric left ventricular hypertrophy. Left ventricular  diastolic function could not be evaluated due to atrial fibrillation. Left  ventricular diastolic function could not be evaluated.   Right Ventricle: The right ventricular size is moderately enlarged. No  increase in right ventricular wall thickness. Right ventricular systolic  function is moderately reduced. There is moderately elevated pulmonary  artery systolic pressure. The tricuspid  regurgitant velocity is 3.20 m/s, and with an assumed right atrial  pressure of 15 mmHg, the estimated right ventricular systolic pressure is  57.8 mmHg.   Left Atrium: Left atrial size was severely dilated.   Right Atrium: Right atrial size was moderately dilated.   Pericardium: Trivial pericardial effusion is present.   Mitral Valve: The mitral valve is normal in structure. Normal mobility of  the mitral valve leaflets. Severe mitral valve regurgitation, with  posteriorly-directed jet. No evidence of mitral valve stenosis.   Tricuspid Valve: The tricuspid valve is normal in structure. Tricuspid  valve regurgitation is moderate . No  evidence of tricuspid stenosis.   Aortic Valve: The aortic valve is normal in structure. Aortic valve  regurgitation is trivial. Mild to moderate aortic valve  sclerosis/calcification is present, without any evidence of aortic  stenosis. Aortic valve mean gradient measures 3.0 mmHg. Aortic  valve peak gradient measures 6.0 mmHg. Aortic valve area, by VTI measures  1.36 cm.   Pulmonic Valve: The pulmonic valve was normal in structure. Pulmonic valve  regurgitation is not visualized. No evidence of pulmonic stenosis.   Aorta: The aortic root is normal in size and structure.   Venous: The inferior vena cava is normal in size with greater than 50%  respiratory variability, suggesting right atrial pressure of 3 mmHg.   IAS/Shunts: No atrial level shunt detected by color flow Doppler.   Laboratory Data:  High Sensitivity Troponin:   Recent Labs  Lab 07/25/20 1332 07/25/20 1523 07/27/20 1250 07/27/20 1548 07/28/20 0110  TROPONINIHS 29* 26* 26* 21* 61*     Chemistry Recent Labs  Lab 07/28/20 0653 07/29/20 1351 07/30/20 0846  NA 143 142 141  K 3.7 4.7 3.7  CL 103 105 105  CO2 20* 18* 21*  GLUCOSE 216* 196* 116*  BUN 45* 61* 63*  CREATININE 2.04* 2.80* 2.31*  CALCIUM 8.5* 8.5* 8.6*  GFRNONAA 22* 15* 19*  GFRAA 25* 17* 22*  ANIONGAP 20* 19* 15    Recent Labs  Lab 07/27/20 1250 07/28/20 0110 07/30/20 0846  PROT 6.4* 5.4* 5.5*  ALBUMIN 3.9 3.1* 3.3*  AST 107* 184* 107*  ALT 69* 120* 105*  ALKPHOS 145* 130* 125  BILITOT 1.1 1.4* 1.2   Hematology Recent Labs  Lab 07/29/20 0236 07/29/20 1049 07/30/20 0424  WBC 24.5* 23.5* 18.6*  RBC 3.63* 3.37* 3.44*  HGB 10.2* 9.4* 9.8*  HCT 33.6* 29.9* 29.5*  MCV 92.6 88.7 85.8  MCH 28.1 27.9 28.5  MCHC 30.4 31.4 33.2  RDW 17.5* 17.0* 16.5*  PLT 209 198 190   BNP Recent Labs  Lab 07/28/20 0110 07/29/20 1049 07/30/20 0424  BNP 1,977.2* >4,500.0* >4,500.0*    DDimer  Recent Labs  Lab 07/27/20 1250  DDIMER  2.57*     Radiology/Studies:  DG Chest 2 View  Result Date: 07/27/2020 CLINICAL DATA:  Shortness of breath for several days. EXAM: CHEST - 2 VIEW COMPARISON:  CT chest and PA and lateral chest 07/25/2020. FINDINGS: Very small bilateral pleural effusions are again seen. There is cardiomegaly. Aortic atherosclerosis. Lungs are emphysematous but clear. No acute bony abnormality. Remote left sixth rib fracture noted. IMPRESSION: No change in small bilateral pleural effusions and mild basilar atelectasis. Cardiomegaly without edema. Aortic Atherosclerosis (ICD10-I70.0) and Emphysema (ICD10-J43.9). Electronically Signed   By: Inge Rise M.D.   On: 07/27/2020 12:45   CT HEAD WO CONTRAST  Result Date: 07/28/2020 CLINICAL DATA:  Encephalopathy EXAM: CT HEAD WITHOUT CONTRAST TECHNIQUE: Contiguous axial images  were obtained from the base of the skull through the vertex without intravenous contrast. COMPARISON:  02/18/2020 FINDINGS: Brain: There is no mass, hemorrhage or extra-axial collection. The size and configuration of the ventricles and extra-axial CSF spaces are normal. There is hypoattenuation of the white matter, most commonly indicating chronic small vessel disease. Vascular: No abnormal hyperdensity of the major intracranial arteries or dural venous sinuses. No intracranial atherosclerosis. Skull: The visualized skull base, calvarium and extracranial soft tissues are normal. Sinuses/Orbits: No fluid levels or advanced mucosal thickening of the visualized paranasal sinuses. No mastoid or middle ear effusion. The orbits are normal. IMPRESSION: Chronic small vessel disease without acute intracranial abnormality. Electronically Signed   By: Ulyses Jarred M.D.   On: 07/28/2020 02:39   US RENAL  Result Date: 07/29/2020 CLINICAL DATA:  Acute kidney injury. EXAM: RENAL / URINARY TRACT ULTRASOUND COMPLETE COMPARISON:  February 17, 2020. FINDINGS: Right Kidney: Renal measurements: 7.9 x 4.8 x 4.4 cm = volume:  87 mL. Increased echogenicity of renal parenchyma is noted. 1.1 cm simple cyst is noted in lower pole. No mass or hydronephrosis visualized. Left Kidney: Renal measurements: 9.3 x 4.4 x 4.2 cm = volume: 88 mL. Increased echogenicity of renal parenchyma is noted. No mass or hydronephrosis visualized. Bladder: Decompressed secondary to Foley catheter. Other: Minimal ascites is noted in the right upper quadrant. IMPRESSION: Increased echogenicity of renal parenchyma is noted bilaterally suggesting medical renal disease. Mild right renal atrophy is noted. No hydronephrosis or renal obstruction is noted. Electronically Signed   By: Shawna Conception M.D.   On: 07/29/2020 12:29   DG Chest Port 1 View  Result Date: 07/29/2020 CLINICAL DATA:  Hypoxia EXAM: PORTABLE CHEST 1 VIEW COMPARISON:  July 27, 2020 FINDINGS: Endotracheal tube tip is 3.3 cm above the carina. Nasogastric tube tip and side port are below the diaphragm. No pneumothorax. There is airspace consolidation in the left lower lobe with minimal left pleural effusion. There is mild atelectatic change in the left upper lobe. Lungs otherwise are clear. There is cardiomegaly with pulmonary vascularity normal. No adenopathy. There is aortic atherosclerosis. Bones are osteoporotic with arthropathy in each shoulder. IMPRESSION: Tube positions as described without pneumothorax. Airspace opacity left lower lung region, likely representing pneumonia or aspiration. Minimal left pleural effusion. Atelectatic change left upper lobe. A small amount of pneumonia in the left upper lobe is possible. Stable cardiomegaly. Aortic Atherosclerosis (ICD10-I70.0). Electronically Signed   By: Lowella Grip III M.D.   On: 07/29/2020 11:07   DG CHEST PORT 1 VIEW  Result Date: 07/27/2020 CLINICAL DATA:  Intubated, history of cardiac event EXAM: PORTABLE CHEST 1 VIEW COMPARISON:  07/27/2020 at 6:09 p.m. FINDINGS: Supine frontal view of the chest was obtained at 9:23 p.m.  Endotracheal tube overlies tracheal air column tip at level of thoracic inlet. Enteric catheter passes below diaphragm tip excluded by collimation. Cardiac silhouette is enlarged. There are developing bibasilar veiling opacities, right greater than left. Stable central vascular congestion. No pneumothorax. IMPRESSION: 1. Support devices as above. 2. Bibasilar lung consolidation and effusions, right greater than left. This has progressed since prior study. 3. Stable central vascular congestion. Electronically Signed   By: Randa Ngo M.D.   On: 07/27/2020 21:32   DG Chest Portable 1 View  Result Date: 07/27/2020 CLINICAL DATA:  Intubated after cardiac event, unresponsive EXAM: PORTABLE CHEST 1 VIEW COMPARISON:  07/27/2020 at 12:20 p.m. FINDINGS: Single frontal view of the chest demonstrates endotracheal tube overlying tracheal air column tip just  below thoracic inlet. Enteric catheter passes below diaphragm tip excluded by collimation. External defibrillator pads overlie the cardiac silhouette, which is enlarged. Trace left pleural effusion is unchanged. There is central vascular congestion. No pneumothorax. No acute bony abnormalities. IMPRESSION: 1. Support devices as above. 2. Central vascular congestion, with stable trace left pleural effusion. Electronically Signed   By: Randa Ngo M.D.   On: 07/27/2020 18:37   DG Foot Complete Right  Result Date: 07/27/2020 CLINICAL DATA:  Plantar ulceration, right foot pain, decreased pulses EXAM: RIGHT FOOT COMPLETE - 3+ VIEW COMPARISON:  02/26/2020 FINDINGS: Frontal, oblique, and lateral views of the right foot are obtained. Bones remain osteopenic. Prior resection or resorption of the fifth middle phalanx unchanged. There are no acute or destructive bony lesions. Specifically, no radiographic evidence of osteomyelitis. Prominent calcaneal spurs are stable. Diffuse vascular calcifications are noted. IMPRESSION: 1. No acute or destructive bony lesion.  Electronically Signed   By: Randa Ngo M.D.   On: 07/27/2020 15:13   EEG adult  Result Date: 07/28/2020 Greta Doom, MD     07/28/2020 12:52 PM History: 86 year old female status post cardiac arrest and now intubated. Sedation: None Technique: This is a 21 channel routine scalp EEG performed at the bedside with bipolar and monopolar montages arranged in accordance to the international 10/20 system of electrode placement. One channel was dedicated to EKG recording. Background: The background consists predominantly of generalized irregular delta and theta range activities.  There is a posterior dominant rhythm which attenuates with eye opening but is poorly sustained, with a frequency of 9 Hz.  No epileptiform discharges or seizures were seen on the study. Photic stimulation: Physiologic driving is not performed EEG Abnormalities: 1) generalized irregular slow activity Clinical Interpretation: This  EEG is consistent with a nonspecific generalized cerebral dysfunction (encephalopathy).  There was no seizure or seizure predisposition recorded on this study. Please note that lack of epileptiform activity on EEG does not preclude the possibility of epilepsy. Roland Rack, MD [facility] [phone] If 7pm- 7am, please page neurology on call as listed in [place].   ECHOCARDIOGRAM COMPLETE  Result Date: 07/28/2020    ECHOCARDIOGRAM REPORT   Patient Name:   CHRISTEENA [place] Date of Exam: 07/28/2020 Medical Rec #:  [id number]     Height:       68.0 in Accession #:    [id number]    Weight:       114.6 lb Date of Birth:  06-24-1933     BSA:          1.613 m Patient Age:    47 years      BP:           133/117 mmHg Patient Gender: F             HR:           106 bpm. Exam Location:  Inpatient Procedure: 2D Echo, Cardiac Doppler and Color Doppler Indications:    Cardiac arrest  History:        Patient has no prior history of Echocardiogram examinations.                 CAD, Arrythmias:Atrial  Fibrillation; Risk Factors:Hypertension                 and Dyslipidemia. Sepsis. Respiratory failure. HFpEF. PVD.  Sonographer:    Clayton Lefort RDCS (AE) Referring Phys: [id number] [place]  1. LVEF ~ 40% with diffuse hypokinesis, moderate RV systolic  dysfunction. Severe mitral regurgitation, RVSP 56 mmHg.  2. Left ventricular ejection fraction, by estimation, is 40 to 45%. The left ventricle has mildly decreased function. The left ventricle demonstrates global hypokinesis. There is moderate concentric left ventricular hypertrophy. Left ventricular diastolic function could not be evaluated.  3. Right ventricular systolic function is moderately reduced. The right ventricular size is moderately enlarged. There is moderately elevated pulmonary artery systolic pressure. The estimated right ventricular systolic pressure is 59.1 mmHg.  4. Left atrial size was severely dilated.  5. Right atrial size was moderately dilated.  6. Large left pleural effusion with lung atelectasis.  7. The mitral valve is normal in structure. Severe mitral valve regurgitation. No evidence of mitral stenosis.  8. Tricuspid valve regurgitation is moderate.  9. The aortic valve is normal in structure. Aortic valve regurgitation is trivial. Mild to moderate aortic valve sclerosis/calcification is present, without any evidence of aortic stenosis. 10. The inferior vena cava is normal in size with greater than 50% respiratory variability, suggesting right atrial pressure of 3 mmHg. FINDINGS  Left Ventricle: Left ventricular ejection fraction, by estimation, is 40 to 45%. The left ventricle has mildly decreased function. The left ventricle demonstrates global hypokinesis. The left ventricular internal cavity size was normal in size. There is  moderate concentric left ventricular hypertrophy. Left ventricular diastolic function could not be evaluated due to atrial fibrillation. Left ventricular diastolic function could not be evaluated.  Right Ventricle: The right ventricular size is moderately enlarged. No increase in right ventricular wall thickness. Right ventricular systolic function is moderately reduced. There is moderately elevated pulmonary artery systolic pressure. The tricuspid  regurgitant velocity is 3.20 m/s, and with an assumed right atrial pressure of 15 mmHg, the estimated right ventricular systolic pressure is 63.8 mmHg. Left Atrium: Left atrial size was severely dilated. Right Atrium: Right atrial size was moderately dilated. Pericardium: Trivial pericardial effusion is present. Mitral Valve: The mitral valve is normal in structure. Normal mobility of the mitral valve leaflets. Severe mitral valve regurgitation, with posteriorly-directed jet. No evidence of mitral valve stenosis. Tricuspid Valve: The tricuspid valve is normal in structure. Tricuspid valve regurgitation is moderate . No evidence of tricuspid stenosis. Aortic Valve: The aortic valve is normal in structure. Aortic valve regurgitation is trivial. Mild to moderate aortic valve sclerosis/calcification is present, without any evidence of aortic stenosis. Aortic valve mean gradient measures 3.0 mmHg. Aortic valve peak gradient measures 6.0 mmHg. Aortic valve area, by VTI measures 1.36 cm. Pulmonic Valve: The pulmonic valve was normal in structure. Pulmonic valve regurgitation is not visualized. No evidence of pulmonic stenosis. Aorta: The aortic root is normal in size and structure. Venous: The inferior vena cava is normal in size with greater than 50% respiratory variability, suggesting right atrial pressure of 3 mmHg. IAS/Shunts: No atrial level shunt detected by color flow Doppler. Additional Comments: There is a large pleural effusion in the left lateral region.  LEFT VENTRICLE PLAX 2D LVIDd:         4.40 cm LVIDs:         3.40 cm LV PW:         1.20 cm LV IVS:        1.20 cm LVOT diam:     2.00 cm LV SV:         21 LV SV Index:   13 LVOT Area:     3.14 cm  LV  Volumes (MOD) LV vol d, MOD A2C: 78.0 ml LV vol d, MOD A4C:  75.1 ml LV vol s, MOD A2C: 49.1 ml LV vol s, MOD A4C: 45.5 ml LV SV MOD A2C:     28.9 ml LV SV MOD A4C:     75.1 ml LV SV MOD BP:      29.7 ml RIGHT VENTRICLE          IVC RV Basal diam:  3.70 cm  IVC diam: 2.20 cm RV Mid diam:    2.70 cm TAPSE (M-mode): 1.3 cm LEFT ATRIUM              Index       RIGHT ATRIUM           Index LA diam:        4.50 cm  2.79 cm/m  RA Area:     24.70 cm LA Vol (A2C):   79.6 ml  49.34 ml/m RA Volume:   80.50 ml  49.90 ml/m LA Vol (A4C):   112.0 ml 69.42 ml/m LA Biplane Vol: 103.0 ml 63.85 ml/m  AORTIC VALVE AV Area (Vmax):    1.39 cm AV Area (Vmean):   1.33 cm AV Area (VTI):     1.36 cm AV Vmax:           122.40 cm/s AV Vmean:          82.200 cm/s AV VTI:            0.153 m AV Peak Grad:      6.0 mmHg AV Mean Grad:      3.0 mmHg LVOT Vmax:         54.32 cm/s LVOT Vmean:        34.720 cm/s LVOT VTI:          0.066 m LVOT/AV VTI ratio: 0.43  AORTA Ao Root diam: 2.90 cm Ao Asc diam:  3.20 cm MR Peak grad:    119.2 mmHg  TRICUSPID VALVE MR Mean grad:    81.0 mmHg   TR Peak grad:   41.0 mmHg MR Vmax:         546.00 cm/s TR Vmax:        320.00 cm/s MR Vmean:        428.0 cm/s MR PISA:         3.08 cm    SHUNTS MR PISA Eff ROA: 22 mm      Systemic VTI:  0.07 m MR PISA Radius:  0.70 cm     Systemic Diam: 2.00 cm Ena Dawley MD Electronically signed by Ena Dawley MD Signature Date/Time: 07/28/2020/11:14:35 AM    Final         No chest pain    Assessment and Plan:   PEA arrest with hypotension with 5 min CPR , intubated on vent and now extubated.  + epi with arrest.   Decrease in EF/ CHF  from 07/11/20 with CHF- EKG changes as well- will defer to Dr. Harrington Challenger, no ACE or ARB due to CKD-4  Stop dilt. She is + 734 - improved from +2604 on the 22nd (she rec'd lasix 80 mg on the 23rd.)   New severe MR when 07/11/20 was mild to moderate.  CAD, total occ of RCA in 02/2020 and distal LAD disease medically treated.  pk  troponin on this admit 61  Placed on plavix   ddimer elevation but neg CTA of chest pin 8/19 prior to elevated Ddimer, bu tpt was on eliquis for DVT and atrial fib   persistent atrial fib, since 02/2020 on  eliquis now on amiodarone drip and in SR with PACs,  She should do better in SR.  Would not resume dilt due to EF, may need to continue amiodarone.  Could change to po she is back on toprol XL 100 mg.  Also could change back to eliquis tomorrow, one more day of IV heparin.Marland Kitchen  Hx CVA 02/2020 on plavix  Carotid disease 80% not felt to be cause of CVA in March   PAD sees vascular as outpt foot ulcer followed by wound care at home.  May need wound care consult here. DM-2 per CCM HTN with BP 156/71 to 146/64 on toprol - no ACE or ARB due to CKD-4  - hydralazine could be added.  But prefer to wait another day       For questions or updates, please contact [place] Please consult [email or web address] for contact info under    Signed, Cecilie Kicks, NP  07/30/2020 12:38 PM  PT seen and examined  I agree with findings as noted above by L Dorene Ar    PT is an 86 yo with known CAD    We are as to see post PEA arrest. The pt has afib, CAD (severe with distal LAD dz and also RCA occlusion. Echo prior to admit showed LVEf 50 to 55% with mild MR (done in [facility])   The pt has never had CP     Seen for SOB on 07/25/20 in ED    Presented again on 07/27/20 On this ED visit she became hypotensive and unresponsive   REsuscitated    Echo on 8/22 as noted above  LVEF 40 to 45% with severe MR  Amiodarone given IV and pt converted to SR  Yesterday patient diruesedwith IV lasix  Pt currently looks comfortable laying flat ON exam, JVP is increased LUngs are relatively clear anteriorly Cardiac exam:  RRR   Gr II/VI systolic murmur  apex Ext are without edema  Feet warm  REcomm:   I would continue to treat medically   WOuld keep on amiodarone as I think, for now at least, she will do better in SR I have reviewed echo   MR is  severe and MV leaflets do not  appear to coapt welll   I do not have images from [facility] to compare I would repeat limited echo tomorrow to reassess now that in SR and diuresed some    Keep on same meds otherwise  I would not push BP control further for now.  Dorris Carnes MD

## 2020-07-30 NOTE — Evaluation (Signed)
Clinical/Bedside Swallow Evaluation Patient Details  Name: Shawna Curry MRN: [id number] Date of Birth: December 08, 1932  Today's Date: 07/30/2020 Time: SLP Start Time (ACUTE ONLY): 0935 SLP Stop Time (ACUTE ONLY): 1000 SLP Time Calculation (min) (ACUTE ONLY): 25 min  Past Medical History:  Past Medical History:  Diagnosis Date  . Atrial fibrillation ([place])   . Hypertension    Past Surgical History:  Past Surgical History:  Procedure Laterality Date  . ABDOMINAL HYSTERECTOMY    . HAND TENDON SURGERY    . JOINT REPLACEMENT     R hip x 2  . PERCUTANEOUS PLACEMENT INTRAVASCULAR STENT CERVICAL CAROTID ARTERY    . TONSILLECTOMY     HPI:  Shawna Curry is an 86 year old woman with a history of coronary artery disease, A. fib, HFpEF who was seen 8/19 for SOB in ED. Intubated from 8/21-8/23 due to cardiac arrest. Sepsis possible vs. no sign of infection. Head CT negative. CXR showed left lower lobe infilatrate which NP suspected was consistent with pneumonia or aspiration.   Assessment / Plan / Recommendation Clinical Impression  Demonstrates appearance of functional swallow with no signs of aspiration. Recommend dys 3 (mechanical soft) diet and thin liquids. Needs assistance with feeding. Recommend pt wears dentures during feeding. SLP will sign off. SLP Visit Diagnosis: Dysphagia, unspecified (R13.10)    Aspiration Risk  Mild aspiration risk    Diet Recommendation Dysphagia 3 (Mech soft);Thin liquid   Liquid Administration via: Cup;Straw Medication Administration: Whole meds with liquid Supervision: Staff to assist with self feeding Compensations: Slow rate;Small sips/bites Postural Changes: Seated upright at 90 degrees    Other  Recommendations Oral Care Recommendations: Oral care BID   Follow up Recommendations 24 hour supervision/assistance      Frequency and Duration            Prognosis        Swallow Study   General Date of Onset: 07/27/20 HPI: Shawna Curry is an 86 year old  woman with a history of coronary artery disease, A. fib, HFpEF who was seen 8/19 for SOB in ED. Intubated from 8/21-8/23 due to cardiac arrest. Sepsis possible vs. no sign of infection. Head CT negative. CXR showed left lower lobe infilatrate which NP suspected was consistent with pneumonia or aspiration. Type of Study: Bedside Swallow Evaluation Diet Prior to this Study: Dysphagia 3 (soft);Thin liquids Temperature Spikes Noted: No Respiratory Status: Nasal cannula History of Recent Intubation: Yes Length of Intubations (days): 3 days Date extubated: 07/29/20 Behavior/Cognition: Cooperative;Pleasant mood Oral Cavity Assessment: Within Functional Limits Oral Care Completed by SLP: No Oral Cavity - Dentition: Dentures, top;Dentures, bottom Vision: Functional for self-feeding Self-Feeding Abilities: Needs assist Patient Positioning: Upright in bed Baseline Vocal Quality: Normal    Oral/Motor/Sensory Function Overall Oral Motor/Sensory Function: Within functional limits   Ice Chips Ice chips: Within functional limits Presentation: Spoon   Thin Liquid Thin Liquid: Within functional limits Presentation: Straw;Cup    Nectar Thick     Honey Thick     Puree Puree: Within functional limits Presentation: Spoon   Solid     Solid: Within functional limits Presentation: Self Fed      Greggory Keen 07/30/2020,10:28 AM

## 2020-07-30 NOTE — Progress Notes (Signed)
ANTICOAGULATION CONSULT NOTE - Follow Up Consult  Pharmacy Consult for heparin Indication: atrial fibrillation  Labs: Recent Labs     0000 07/27/20 1250 07/27/20 1548 07/27/20 1811 07/28/20 0110 07/28/20 0333 07/28/20 0511 07/28/20 0653 07/28/20 1044 07/28/20 1517 07/28/20 1759 07/29/20 0236 07/29/20 0236 07/29/20 1049 07/29/20 1351 07/29/20 2215 07/30/20 0424 07/30/20 0443  HGB  --  10.3*  --    < > 9.4*   < >   < >  --   --   --   --  10.2*   < > 9.4*  --   --  9.8*  --   HCT  --  32.8*  --    < > 29.2*   < >   < >  --   --   --   --  33.6*  --  29.9*  --   --  29.5*  --   PLT   < > 221  --   --  202   < >   < >  --   --   --   --  209  --  198  --   --  190  --   APTT  --   --   --    < >  --   --    < >  --   --    < > >200* >200*   < >  --  90* 66* 60*  --   HEPARINUNFRC  --   --   --    < >  --    < >  --   --   --    < > >2.20* >2.20*  --   --   --   --   --  1.00*  CREATININE   < > 1.90*  --   --  1.89*  --   --  2.04*  --   --   --   --   --   --  2.80*  --   --   --   CKTOTAL  --   --   --   --   --   --   --   --  54  --   --   --   --   --   --   --   --   --   TROPONINIHS  --  26* 21*  --  61*  --   --   --   --   --   --   --   --   --   --   --   --   --    < > = values in this interval not displayed.    Assessment: 86yo female now subtherapeutic on heparin after one PTT at goal; no gtt issues or signs of bleeding per RN, CBC stable.  Goal of Therapy:  aPTT 66-102 seconds   Plan:  Will increase heparin gtt by 1 unit/kg/hr to 350 units/hr and check PTT in 8 hours.    Wynona Neat, PharmD, BCPS  07/30/2020,5:47 AM

## 2020-07-30 NOTE — Progress Notes (Addendum)
ANTICOAGULATION CONSULT NOTE  Pharmacy Consult for Heparin Indication: chest pain/ACS, atrial fibrillation  No Known Allergies  Patient Measurements: Weight: 51.1 kg (112 lb 10.5 oz) Heparin Dosing Weight: 48 kg  Vital Signs: Temp: 97.3 F (36.3 C) (08/24 1532) Temp Source: Oral (08/24 1532) BP: 131/77 (08/24 1532) Pulse Rate: 61 (08/24 1532)  Labs: Recent Labs    07/28/20 0110 07/28/20 0333 07/28/20 0511 07/28/20 0653 07/28/20 1044 07/28/20 1517 07/28/20 1759 07/29/20 0236 07/29/20 0236 07/29/20 1049 07/29/20 1351 07/29/20 1351 07/29/20 2215 07/30/20 0424 07/30/20 0443 07/30/20 0846 07/30/20 1530  HGB 9.4*   < >   < >  --   --   --   --  10.2*   < > 9.4*  --   --   --  9.8*  --   --   --   HCT 29.2*   < >   < >  --   --   --   --  33.6*  --  29.9*  --   --   --  29.5*  --   --   --   PLT 202   < >   < >  --   --   --   --  209  --  198  --   --   --  190  --   --   --   APTT  --   --    < >  --   --    < > >200* >200*   < >  --  90*   < > 66* 60*  --   --  51*  HEPARINUNFRC  --    < >  --   --   --    < > >2.20* >2.20*  --   --   --   --   --   --  1.00*  --   --   CREATININE 1.89*   < >  --  2.04*  --   --   --   --   --   --  2.80*  --   --   --   --  2.31*  --   CKTOTAL  --   --   --   --  54  --   --   --   --   --   --   --   --   --   --   --   --   TROPONINIHS 61*  --   --   --   --   --   --   --   --   --   --   --   --   --   --   --   --    < > = values in this interval not displayed.    Estimated Creatinine Clearance: 14.4 mL/min (A) (by C-G formula based on SCr of 2.31 mg/dL (H)).   Medical History: Past Medical History:  Diagnosis Date  . Atrial fibrillation ([place])   . Coronary artery disease   . Hypertension     Assessment: 86 year old woman with hx of CAD, hx of cardiac stent, atrial fibrillation, HFpEF, HLD, HTN, PVD presented with SOB and subsequently suffered PEA arrest; received intubation and CPR in ED.   Noted history of afib,  anticoagulation note from 07/23/20 indicates that she was on apixaban prior to admit, confirmed by baseline elevated heparin level; currently  monitoring anticoagulation using aPTT, given elevated baseline heparin level.  aPTT drawn ~9 hrs after heparin infusion was increased to 350 units/hr was 51 sec, which is below the goal range for this pt. H/H 9.8/29.5, platelets 190. Per RN, no issues with IV or bleeding observed. Per Cardiology note, may transition pt back to apixaban tomorrow.  Goal of Therapy:  Heparin level 0.3-0.7 units/ml aPTT 66-102 seconds Monitor platelets by anticoagulation protocol: Yes   Plan:  Increase heparin infusion to 400 units/hr (has been sensitive to heparin increases in the past) Check aPTT 8 hrs after increasing heparin infusion Monitor daily aPTT, heparin level, CBC Monitor for signs/symptoms of bleeding F/U transition back to apixaban  Gillermina Hu, PharmD, BCPS, [facility] Clinical Pharmacist  07/30/2020 4:58 PM

## 2020-07-31 ENCOUNTER — Inpatient Hospital Stay (HOSPITAL_COMMUNITY): Payer: Medicare HMO

## 2020-07-31 DIAGNOSIS — I4891 Unspecified atrial fibrillation: Secondary | ICD-10-CM

## 2020-07-31 DIAGNOSIS — I469 Cardiac arrest, cause unspecified: Secondary | ICD-10-CM

## 2020-07-31 DIAGNOSIS — I340 Nonrheumatic mitral (valve) insufficiency: Secondary | ICD-10-CM

## 2020-07-31 DIAGNOSIS — I5023 Acute on chronic systolic (congestive) heart failure: Secondary | ICD-10-CM

## 2020-07-31 DIAGNOSIS — E1165 Type 2 diabetes mellitus with hyperglycemia: Secondary | ICD-10-CM

## 2020-07-31 DIAGNOSIS — E785 Hyperlipidemia, unspecified: Secondary | ICD-10-CM

## 2020-07-31 DIAGNOSIS — R5381 Other malaise: Secondary | ICD-10-CM

## 2020-07-31 LAB — GLUCOSE, CAPILLARY
Glucose-Capillary: 87 mg/dL (ref 70–99)
Glucose-Capillary: 130 mg/dL — ABNORMAL HIGH (ref 70–99)
Glucose-Capillary: 122 mg/dL — ABNORMAL HIGH (ref 70–99)
Glucose-Capillary: 68 mg/dL — ABNORMAL LOW (ref 70–99)
Glucose-Capillary: 100 mg/dL — ABNORMAL HIGH (ref 70–99)
Glucose-Capillary: 188 mg/dL — ABNORMAL HIGH (ref 70–99)
Glucose-Capillary: 122 mg/dL — ABNORMAL HIGH (ref 70–99)

## 2020-07-31 LAB — ECHOCARDIOGRAM LIMITED
Calc EF: 44.4 %
MV M vel: 4.7 m/s
MV Peak grad: 88.4 mmHg
Radius: 0.8 cm
S' Lateral: 3.1 cm
Single Plane A2C EF: 46.1 %
Single Plane A4C EF: 46.1 %
Weight: 1802.48 oz

## 2020-07-31 LAB — COMPREHENSIVE METABOLIC PANEL
ALT: 85 U/L — ABNORMAL HIGH (ref 0–44)
AST: 75 U/L — ABNORMAL HIGH (ref 15–41)
Albumin: 3 g/dL — ABNORMAL LOW (ref 3.5–5.0)
Alkaline Phosphatase: 135 U/L — ABNORMAL HIGH (ref 38–126)
Anion gap: 12 (ref 5–15)
BUN: 57 mg/dL — ABNORMAL HIGH (ref 8–23)
CO2: 22 mmol/L (ref 22–32)
Calcium: 8.6 mg/dL — ABNORMAL LOW (ref 8.9–10.3)
Chloride: 106 mmol/L (ref 98–111)
Creatinine, Ser: 1.91 mg/dL — ABNORMAL HIGH (ref 0.44–1.00)
GFR calc Af Amer: 27 mL/min — ABNORMAL LOW (ref >60)
GFR calc non Af Amer: 23 mL/min — ABNORMAL LOW (ref >60)
Glucose, Bld: 108 mg/dL — ABNORMAL HIGH (ref 70–99)
Potassium: 3.7 mmol/L (ref 3.5–5.1)
Sodium: 140 mmol/L (ref 135–145)
Total Bilirubin: 0.7 mg/dL (ref 0.3–1.2)
Total Protein: 5.1 g/dL — ABNORMAL LOW (ref 6.5–8.1)

## 2020-07-31 LAB — CBC
HCT: 30.3 % — ABNORMAL LOW (ref 36.0–46.0)
Hemoglobin: 9.9 g/dL — ABNORMAL LOW (ref 12.0–15.0)
MCH: 28.2 pg (ref 26.0–34.0)
MCHC: 32.7 g/dL (ref 30.0–36.0)
MCV: 86.3 fL (ref 80.0–100.0)
Platelets: 180 10*3/uL (ref 150–400)
RBC: 3.51 MIL/uL — ABNORMAL LOW (ref 3.87–5.11)
RDW: 17.2 % — ABNORMAL HIGH (ref 11.5–15.5)
WBC: 11.3 10*3/uL — ABNORMAL HIGH (ref 4.0–10.5)
nRBC: 0.6 % — ABNORMAL HIGH (ref 0.0–0.2)

## 2020-07-31 LAB — PROCALCITONIN: Procalcitonin: 2.68 ng/mL

## 2020-07-31 LAB — APTT
aPTT: 45 seconds — ABNORMAL HIGH (ref 24–36)
aPTT: 48 seconds — ABNORMAL HIGH (ref 24–36)

## 2020-07-31 LAB — HEPARIN LEVEL (UNFRACTIONATED): Heparin Unfractionated: 0.65 IU/mL (ref 0.30–0.70)

## 2020-07-31 LAB — BRAIN NATRIURETIC PEPTIDE: B Natriuretic Peptide: 2982 pg/mL — ABNORMAL HIGH (ref 0.0–100.0)

## 2020-07-31 MED ORDER — INSULIN ASPART 100 UNIT/ML ~~LOC~~ SOLN
0.0000 [IU] | Freq: Three times a day (TID) | SUBCUTANEOUS | Status: DC
  Administered 2020-07-31 – 2020-08-01 (×3): 3 [IU] via SUBCUTANEOUS

## 2020-07-31 MED ORDER — FUROSEMIDE 10 MG/ML IJ SOLN
60.0000 mg | Freq: Once | INTRAMUSCULAR | Status: AC
  Administered 2020-07-31: 60 mg via INTRAVENOUS
  Filled 2020-07-31: qty 6

## 2020-07-31 NOTE — Evaluation (Signed)
Occupational Therapy Evaluation Patient Details Name: Shawna Curry MRN: [id number] DOB: [birth year] Today's Date: 07/31/2020    History of Present Illness 86 yo admitted 8/21 with SOB with respiratory failure leading to cardiac arrest post intubation. Extubated 8/23. PMhx: HFpEF, Afib, CAD   Clinical Impression   PTA, pt lives alone (but family has been intermittently staying with pt recently) and able to complete ADLs and mobility using RW at Supervision level.  Pt presents now with diagnoses above and deficits in strength, coordination, proprioception, sitting/standing balance, endurance and cognition. Pt requires  Mod A for bed mobility, Max A for sit to stand attempt with Stedy but pt unable to sustain longer than 15 seconds. Pt noted with great difficulty controlling B UE purposeful movements impacting ability to complete simple ADLs without assist. Pt currently requires up to Max A for UB ADLs and Total A for LB ADLs. Recommend CIR for intensive therapies in order to safely return home.    Follow Up Recommendations  CIR;Supervision/Assistance - 24 hour    Equipment Recommendations  Other (comment) (TBD)    Recommendations for Other Services       Precautions / Restrictions Precautions Precautions: Fall Precaution Comments: truncal and bil UE ataxia Restrictions Weight Bearing Restrictions: No      Mobility Bed Mobility Overal bed mobility: Needs Assistance Bed Mobility: Supine to Sit;Sit to Supine     Supine to sit: Mod assist;HOB elevated Sit to supine: Min assist   General bed mobility comments: Mod A to sit EOB, difficulty scooting or locating bed rail to pull self to EOB. MIn A for safe return of B LE to bed  Transfers Overall transfer level: Needs assistance Equipment used: Ambulation equipment used Transfers: Sit to/from Stand Sit to Stand: Max assist;From elevated surface         General transfer comment: Max A with elevated bed for sit to stand from  bedside in [place]. Unable to sustain longer than 15 seconds, unable to place pads behind pt. R UE noted to drop from [employer] once, difficulty straightening R foot on platform.     Balance Overall balance assessment: Needs assistance Sitting-balance support: Feet supported;No upper extremity supported Sitting balance-Leahy Scale: Poor Sitting balance - Comments: MIn A for maintain sitting balance if no UE supported on bed Postural control: Right lateral lean;Posterior lean Standing balance support: Bilateral upper extremity supported;During functional activity Standing balance-Leahy Scale: Poor Standing balance comment: Mod A to Max A to maintain standing balance in [place] with 1 person assist                           ADL either performed or assessed with clinical judgement   ADL Overall ADL's : Needs assistance/impaired Eating/Feeding: Bed level;Moderate assistance   Grooming: Minimal assistance;Sitting Grooming Details (indicate cue type and reason): Able to wash face using L hand with supervision, but will require increased assistance for fine motor tasks during grooming Upper Body Bathing: Moderate assistance;Sitting   Lower Body Bathing: Maximal assistance;Sit to/from stand   Upper Body Dressing : Maximal assistance;Sitting   Lower Body Dressing: Total assistance;Bed level       Toileting- Clothing Manipulation and Hygiene: Total assistance;Bed level         General ADL Comments: Pt with deficits in cognition, coordination, proprioception, strength, sitting/standing balance impacting ability to complete ADLs/transfers without extensive assist     Vision Baseline Vision/History: Wears glasses Wears Glasses: At all times Patient Visual Report: No change  from baseline Vision Assessment?: Vision impaired- to be further tested in functional context Additional Comments: Some difficulty scanning, difficulty locating yellow sock that she dropped beside her L hip      Perception Perception Perception Tested?: Yes Perception Deficits: Inattention/neglect;Spatial orientation Inattention/Neglect: Impaired- to be further tested in functional context Comments: Pt with difficulty attending to B UE use (R > L), frequently losing track of what her UE was trying to do, impaired depth perception   Praxis Praxis Praxis tested?: Deficits Deficits: Organization;Limb apraxia    Pertinent Vitals/Pain       Hand Dominance Right   Extremity/Trunk Assessment Upper Extremity Assessment Upper Extremity Assessment: RUE deficits/detail;LUE deficits/detail RUE Deficits / Details: pt with noted weakness with automatic assist of LUE to move RUE. Noted ataxia with decreased spatial perception and frequent dropping of R UE during use RUE Coordination: decreased fine motor;decreased gross motor LUE Deficits / Details: better than R UE but difficulty with coordination and proprioception as well LUE Coordination: decreased fine motor;decreased gross motor   Lower Extremity Assessment Lower Extremity Assessment: Defer to PT evaluation   Cervical / Trunk Assessment Cervical / Trunk Assessment: Kyphotic   Communication Communication Communication: No difficulties   Cognition Arousal/Alertness: Awake/alert Behavior During Therapy: WFL for tasks assessed/performed Overall Cognitive Status: Impaired/Different from baseline Area of Impairment: Memory;Following commands;Awareness;Problem solving;Orientation                 Orientation Level: Disoriented to;Situation;Place;Time   Memory: Decreased short-term memory Following Commands: Follows one step commands with increased time;Follows multi-step commands inconsistently   Awareness: Intellectual Problem Solving: Slow processing;Decreased initiation;Difficulty sequencing;Requires verbal cues;Requires tactile cues General Comments: Pt reports initial surprise at being informed at hospital - she thought she was here  to get her nails done. Reports it is March, unable to state year   General Comments  HR varied from upper 40s to low 60s. Pt denies dizziness with movement. Assessed coordination with pt able to unscrew medium sized cap from toiletry item, unable to pair socks (dropped, lost items, unable to attend to task)    Exercises     Shoulder Instructions      Home Living Family/patient expects to be discharged to:: Private residence Living Arrangements: Alone Available Help at Discharge: Family;Available 24 hours/day Type of Home: House Home Access: Ramped entrance     Home Layout: One level     Bathroom Shower/Tub: Occupational psychologist: Standard     Home Equipment: Toilet riser;Walker - 2 wheels;Cane - single point   Additional Comments: PLOF and setup obtain from PT info (conversation with son) and pt confirmed home setup, but conflicing PLOF information as pt confused      Prior Functioning/Environment Level of Independence: Needs assistance  Gait / Transfers Assistance Needed: walking with RW with supervision ADL's / Homemaking Assistance Needed: family assists with housework, able to complete BADLs with supervision   Comments: son and daughter alternate staying with pt        OT Problem List: Decreased strength;Decreased activity tolerance;Impaired balance (sitting and/or standing);Impaired vision/perception;Decreased coordination;Decreased cognition;Decreased safety awareness;Decreased knowledge of use of DME or AE;Impaired UE functional use      OT Treatment/Interventions: Self-care/ADL training;Therapeutic exercise;Energy conservation;DME and/or AE instruction;Therapeutic activities;Visual/perceptual remediation/compensation;Patient/family education;Balance training    OT Goals(Current goals can be found in the care plan section) Acute Rehab OT Goals Patient Stated Goal: be able to walk OT Goal Formulation: With patient Time For Goal Achievement:  08/14/20 Potential to Achieve Goals: Good ADL  Goals Pt Will Perform Eating: with supervision;sitting Pt Will Perform Grooming: with supervision;sitting Pt Will Perform Upper Body Bathing: with min guard assist;sitting Pt Will Perform Lower Body Bathing: with mod assist;sitting/lateral leans;sit to/from stand Additional ADL Goal #1: Pt to demonstrate sit to stand transfer with no more than Mod A in preparation for ADL transfers  OT Frequency: Min 2X/week   Barriers to D/C:            Co-evaluation              AM-PAC OT "6 Clicks" Daily Activity     Outcome Measure Help from another person eating meals?: A Lot Help from another person taking care of personal grooming?: A Little Help from another person toileting, which includes using toliet, bedpan, or urinal?: Total Help from another person bathing (including washing, rinsing, drying)?: A Lot Help from another person to put on and taking off regular upper body clothing?: A Lot Help from another person to put on and taking off regular lower body clothing?: Total 6 Click Score: 11   End of Session Equipment Utilized During Treatment: Gait belt;Other (comment) Charlaine Dalton) Nurse Communication: Mobility status  Activity Tolerance: Patient limited by fatigue Patient left: in bed;with bed alarm set;with call bell/phone within reach  OT Visit Diagnosis: Unsteadiness on feet (R26.81);Other abnormalities of gait and mobility (R26.89);Muscle weakness (generalized) (M62.81);Ataxia, unspecified (R27.0);Apraxia (R48.2);Other symptoms and signs involving cognitive function                Time: 5462-7035 OT Time Calculation (min): 24 min Charges:  OT General Charges $OT Visit: 1 Visit OT Evaluation $OT Eval High Complexity: 1 High OT Treatments $Therapeutic Activity: 8-22 mins  Layla Maw, OTR/L  Layla Maw 07/31/2020, 11:02 AM

## 2020-07-31 NOTE — Consult Note (Signed)
Physical Medicine and Rehabilitation Consult Reason for Consult: Decreased functional ability after PEA arrest Referring Physician: [facility]   HPI: Shawna Curry is a 86 y.o. right-handed female with history of atrial fibrillation maintained on Eliquis and Plavix, hyperlipidemia, DVT of tibial vein, type 2 diabetes mellitus, left-sided PCA infarction, CAD/NSTEMI 02/2020 followed by Dr.Rohrbeck with [facility], history of right CEA, left 80% followed by vascular surgery, acute on chronic systolic congestive heart failure.  History taken from chart review and daughter due to cognition.  Patient lives alone.  1 level home ramped entrance.  Ambulates with a rolling walker.  She has good family support with children alternating schedules throughout the day to provide assistance.  Earlier this year, patient went to the hospital on multiple occasions, stay at [facility], receiving first outpatient therapies and then home health therapies.  She presented on 07/25/2020 with increasing DOE. Patient noted to be hypotensive and became unresponsive in the ED requiring intubation/PEA arrest 5 minutes of CPR and given epinephrine, bicarb also with lactic acidosis.  EKG showed interval T waves in V5 V6 with 1 mm of elevation in V4 and placed on intravenous amiodarone and placed on IV heparin.  Admission chemistries potassium 5.2, BUN 51 creatinine 1.90, troponin 26-29, lactic acid 2.8, BNP 2010.  Initial echocardiogram with EF of 40-45%.  The left ventricle demonstrating global hypokinesis and plan to repeat echocardiogram 07/31/2020.  She was extubated 07/29/2020 and aspirin initiated 07/30/2020.  Close monitoring of renal function creatinine elevated 2.31.  MRI of the brain completed 07/30/2020 after extubation showed small acute posterior right cerebral infarction, advised to continue low-dose aspirin.  Patient is on a mechanical soft diet.  Therapy evaluations completed with recommendations of physical medicine  rehab consult.  Review of Systems  Unable to perform ROS: Mental acuity   Past Medical History:  Diagnosis Date  . Atrial fibrillation ([place])   . Coronary artery disease   . Hypertension    Past Surgical History:  Procedure Laterality Date  . ABDOMINAL HYSTERECTOMY    . HAND TENDON SURGERY    . JOINT REPLACEMENT     R hip x 2  . PERCUTANEOUS PLACEMENT INTRAVASCULAR STENT CERVICAL CAROTID ARTERY    . TONSILLECTOMY     Family History  Problem Relation Age of Onset  . Diabetes Mother    Social History:  reports that she has quit smoking. She has never used smokeless tobacco. She reports that she does not drink alcohol and does not use drugs. Allergies: No Known Allergies Medications Prior to Admission  Medication Sig Dispense Refill  . acetaminophen (TYLENOL) 650 MG CR tablet Take 1,300 mg by mouth daily as needed for pain.    Marland Kitchen alendronate (FOSAMAX) 35 MG tablet Take 35 mg by mouth every Monday.    Marland Kitchen apixaban (ELIQUIS) 2.5 MG TABS tablet Take 2.5 mg by mouth 2 (two) times daily.    Marland Kitchen atorvastatin (LIPITOR) 20 MG tablet Take 20 mg by mouth every evening.     . clopidogrel (PLAVIX) 75 MG tablet Take 75 mg by mouth daily.    Marland Kitchen diltiazem (CARDIZEM CD) 120 MG 24 hr capsule Take 120 mg by mouth daily.    Marland Kitchen ezetimibe (ZETIA) 10 MG tablet Take 10 mg by mouth at bedtime.     . folic acid (FOLVITE) 1 MG tablet Take 1 mg by mouth daily.    . furosemide (LASIX) 20 MG tablet Take 40 mg by mouth daily.    Marland Kitchen  gabapentin (NEURONTIN) 100 MG capsule Take 200 mg by mouth 3 (three) times daily.    Marland Kitchen l-methylfolate-B6-B12 (METANX) 3-35-2 MG TABS tablet Take 1 tablet by mouth 2 (two) times daily.    Marland Kitchen latanoprost (XALATAN) 0.005 % ophthalmic solution Place 1 drop into both eyes at bedtime.    . metFORMIN (GLUCOPHAGE-XR) 500 MG 24 hr tablet Take 500 mg by mouth 2 (two) times daily with a meal.    . metoprolol succinate (TOPROL-XL) 100 MG 24 hr tablet Take 100 mg by mouth daily.      Home: Home  Living Family/patient expects to be discharged to:: Private residence Living Arrangements: Alone Available Help at Discharge: Family, Available 24 hours/day Type of Home: House Home Access: [place]: One level Bathroom Shower/Tub: Multimedia programmer: Mission: Geneticist, molecular, Environmental consultant - 2 wheels, [place] - single point Additional Comments: PLOF and setup provided by son Duane via phone  Functional History: Prior Function Level of Independence: Needs assistance Gait / Transfers Assistance Needed: walking with RW with supervision ADL's / Homemaking Assistance Needed: family assists with housework Comments: son and daughter alternate staying with pt Functional Status:  Mobility: Bed Mobility Overal bed mobility: Needs Assistance Bed Mobility: Supine to Sit, Sit to Supine Supine to sit: Min assist, HOB elevated Sit to supine: Mod assist General bed mobility comments: HOb 20 degrees with increased time and min assist to fully elevate trunk into sitting. Mod to return to supine Transfers Overall transfer level: Needs assistance Transfers: Sit to/from Stand Sit to Stand: Max assist, +2 physical assistance General transfer comment: attempted to stand x 2 with max +2 but pt with posterior lean, ataxic trunk unable to hook bil UE with therapist and nurse and not coordinating movement with cues to stand. Will require lift at this time Ambulation/Gait General Gait Details: unable    ADL:    Cognition: Cognition Overall Cognitive Status: Impaired/Different from baseline Orientation Level: Oriented to person, Disoriented to place, Disoriented to time, Oriented to situation Cognition Arousal/Alertness: Awake/alert Behavior During Therapy: WFL for tasks assessed/performed Overall Cognitive Status: Impaired/Different from baseline Area of Impairment: Memory, Following commands, Awareness, Problem solving, Orientation Orientation Level: Disoriented  to, Situation, Place, Time Memory: Decreased short-term memory Following Commands: Follows one step commands inconsistently Awareness: Intellectual Problem Solving: Slow processing, Decreased initiation, Difficulty sequencing, Requires verbal cues, Requires tactile cues General Comments: pt stating year as "62" initially stating she has 2 sons and no daughter then later able to state name of son and daughter  Blood pressure 106/67, pulse (!) 50, temperature 97.7 F (36.5 C), temperature source Oral, resp. rate 16, weight 51.1 kg, SpO2 94 %. Physical Exam Vitals reviewed.  Constitutional:      General: She is not in acute distress.    Appearance: She is well-developed and normal weight.     Comments: Frail  HENT:     Head: Normocephalic and atraumatic.     Right Ear: External ear normal.     Left Ear: External ear normal.  Eyes:     General:        Right eye: No discharge.        Left eye: No discharge.     Extraocular Movements: Extraocular movements intact.  Cardiovascular:     Rate and Rhythm: Normal rate and regular rhythm.  Pulmonary:     Effort: Pulmonary effort is normal. No respiratory distress.     Breath sounds: Normal breath sounds. No stridor.  Abdominal:  General: Abdomen is flat. Bowel sounds are normal. There is no distension.  Musculoskeletal:     Cervical back: Normal range of motion and neck supple.     Comments: No edema or tenderness in extremities  Skin:    General: Skin is warm and dry.  Neurological:     Mental Status: She is alert.     Comments: Provides her name and age but somewhat distracted.  Follows simple commands. Alert and oriented x1 Motor: Appears to be 4-4+/5 throughout, except for baseline RUE weakness  Psychiatric:        Speech: Speech is tangential.        Cognition and Memory: Cognition is impaired. Memory is impaired.     Results for orders placed or performed during the hospital encounter of 07/27/20 (from the past 24  hour(s))  Glucose, capillary     Status: None   Collection Time: 07/30/20  7:56 AM  Result Value Ref Range   Glucose-Capillary 82 70 - 99 mg/dL  Comprehensive metabolic panel     Status: Abnormal   Collection Time: 07/30/20  8:46 AM  Result Value Ref Range   Sodium 141 135 - 145 mmol/L   Potassium 3.7 3.5 - 5.1 mmol/L   Chloride 105 98 - 111 mmol/L   CO2 21 (L) 22 - 32 mmol/L   Glucose, Bld 116 (H) 70 - 99 mg/dL   BUN 63 (H) 8 - 23 mg/dL   Creatinine, Ser 2.31 (H) 0.44 - 1.00 mg/dL   Calcium 8.6 (L) 8.9 - 10.3 mg/dL   Total Protein 5.5 (L) 6.5 - 8.1 g/dL   Albumin 3.3 (L) 3.5 - 5.0 g/dL   AST 107 (H) 15 - 41 U/L   ALT 105 (H) 0 - 44 U/L   Alkaline Phosphatase 125 38 - 126 U/L   Total Bilirubin 1.2 0.3 - 1.2 mg/dL   GFR calc non Af Amer 19 (L) >60 mL/min   GFR calc Af Amer 22 (L) >60 mL/min   Anion gap 15 5 - 15  Glucose, capillary     Status: Abnormal   Collection Time: 07/30/20 11:40 AM  Result Value Ref Range   Glucose-Capillary 120 (H) 70 - 99 mg/dL  APTT     Status: Abnormal   Collection Time: 07/30/20  3:30 PM  Result Value Ref Range   aPTT 51 (H) 24 - 36 seconds  Glucose, capillary     Status: Abnormal   Collection Time: 07/30/20  3:54 PM  Result Value Ref Range   Glucose-Capillary 111 (H) 70 - 99 mg/dL  Glucose, capillary     Status: Abnormal   Collection Time: 07/30/20  8:51 PM  Result Value Ref Range   Glucose-Capillary 176 (H) 70 - 99 mg/dL  Glucose, capillary     Status: None   Collection Time: 07/31/20 12:25 AM  Result Value Ref Range   Glucose-Capillary 87 70 - 99 mg/dL  Glucose, capillary     Status: Abnormal   Collection Time: 07/31/20  4:06 AM  Result Value Ref Range   Glucose-Capillary 130 (H) 70 - 99 mg/dL   MR BRAIN WO CONTRAST  Result Date: 07/30/2020 CLINICAL DATA:  Cardiac arrest. History of atrial fibrillation and stroke. EXAM: MRI HEAD WITHOUT CONTRAST TECHNIQUE: Multiplanar, multiecho pulse sequences of the brain and surrounding structures  were obtained without intravenous contrast. COMPARISON:  Head CT 07/28/2020 and MRI 02/19/2020 FINDINGS: The study could not be completed due to the patient's condition. Axial and coronal diffusion,  axial T2* gradient echo, axial FLAIR, and coronal T2 sequences were obtained and are motion degraded including severe motion on the gradient echo sequence. Brain: There is a small acute cortical and subcortical infarct in the posterior right cerebral hemisphere near the junction of the temporal, parietal, and occipital lobes (posterior MCA or border zone). No gross intracranial hemorrhage, mass/mass effect, or extra-axial fluid collection is identified. There is a chronic lacunar infarct at the lateral aspect of the left thalamus. T2 hyperintensities in the cerebral white matter bilaterally are similar to the prior MRI and are nonspecific but compatible with mild-to-moderate chronic small vessel ischemic disease. There is mild cerebral atrophy. A chronic lacunar infarct or cyst in the left subinsular region is unchanged. Mild cerebral atrophy is not considered abnormal for age. Vascular: Limited assessment on this motion degraded, incomplete examination. Skull and upper cervical spine: No gross destructive skull lesion. Sinuses/Orbits: No acute findings. Other: None. IMPRESSION: 1. Motion degraded, incomplete examination. 2. Small acute posterior right cerebral infarct. 3. Mild-to-moderate chronic small vessel ischemic disease. Electronically Signed   By: Logan Bores M.D.   On: 07/30/2020 15:16   US RENAL  Result Date: 07/29/2020 CLINICAL DATA:  Acute kidney injury. EXAM: RENAL / URINARY TRACT ULTRASOUND COMPLETE COMPARISON:  February 17, 2020. FINDINGS: Right Kidney: Renal measurements: 7.9 x 4.8 x 4.4 cm = volume: 87 mL. Increased echogenicity of renal parenchyma is noted. 1.1 cm simple cyst is noted in lower pole. No mass or hydronephrosis visualized. Left Kidney: Renal measurements: 9.3 x 4.4 x 4.2 cm = volume: 88  mL. Increased echogenicity of renal parenchyma is noted. No mass or hydronephrosis visualized. Bladder: Decompressed secondary to Foley catheter. Other: Minimal ascites is noted in the right upper quadrant. IMPRESSION: Increased echogenicity of renal parenchyma is noted bilaterally suggesting medical renal disease. Mild right renal atrophy is noted. No hydronephrosis or renal obstruction is noted. Electronically Signed   By: Marijo Conception M.D.   On: 07/29/2020 12:29   DG Chest Port 1 View  Result Date: 07/29/2020 CLINICAL DATA:  Hypoxia EXAM: PORTABLE CHEST 1 VIEW COMPARISON:  July 27, 2020 FINDINGS: Endotracheal tube tip is 3.3 cm above the carina. Nasogastric tube tip and side port are below the diaphragm. No pneumothorax. There is airspace consolidation in the left lower lobe with minimal left pleural effusion. There is mild atelectatic change in the left upper lobe. Lungs otherwise are clear. There is cardiomegaly with pulmonary vascularity normal. No adenopathy. There is aortic atherosclerosis. Bones are osteoporotic with arthropathy in each shoulder. IMPRESSION: Tube positions as described without pneumothorax. Airspace opacity left lower lung region, likely representing pneumonia or aspiration. Minimal left pleural effusion. Atelectatic change left upper lobe. A small amount of pneumonia in the left upper lobe is possible. Stable cardiomegaly. Aortic Atherosclerosis (ICD10-I70.0). Electronically Signed   By: Lowella Grip III M.D.   On: 07/29/2020 11:07    Assessment/Plan: Diagnosis: Debility Labs independently reviewed.  Records reviewed and summated above.  1. Does the need for close, 24 hr/day medical supervision in concert with the patient's rehab needs make it unreasonable for this patient to be served in a less intensive setting? Yes 2. Co-Morbidities requiring supervision/potential complications: atrial fibrillation (monitor with increased exertion, cont Eliquis and Plavix),  hyperlipidemia, DVT of tibial vein (cont anticoagulation), type 2 DM (Monitor in accordance with exercise and adjust meds as necessary), left-sided PCA infarction, CAD/NSTEMI 02/2020, history of right CEA, left 80% followed by vascular surgery, acute on chronic systolic CHF (Monitor in  accordance with increased physical activity and avoid UE resistance excercises) 3. Due to bladder management, bowel management, safety, disease management, medication administration and patient education, does the patient require 24 hr/day rehab nursing? Yes 4. Does the patient require coordinated care of a physician, rehab nurse, therapy disciplines of PT/OT to address physical and functional deficits in the context of the above medical diagnosis(es)? Yes Addressing deficits in the following areas: balance, endurance, locomotion, strength, transferring, bathing, dressing, cognition and psychosocial support 5. Can the patient actively participate in an intensive therapy program of at least 3 hrs of therapy per day at least 5 days per week? Yes 6. The potential for patient to make measurable gains while on inpatient rehab is excellent 7. Anticipated functional outcomes upon discharge from inpatient rehab are min assist  with PT, min assist with OT, min assist with SLP. 8. Estimated rehab length of stay to reach the above functional goals is: 13-16 days. 9. Anticipated discharge destination: Home 10. Overall Rehab/Functional Prognosis: good and fair  RECOMMENDATIONS: This patient's condition is appropriate for continued rehabilitative care in the following setting: CIR Patient has agreed to participate in recommended program. Potentially Note that insurance prior authorization may be required for reimbursement for recommended care.  Comment: Rehab Admissions Coordinator to follow up.  I have personally performed a face to face diagnostic evaluation, including, but not limited to relevant history and physical exam  findings, of this patient and developed relevant assessment and plan.  Additionally, I have reviewed and concur with the physician assistant's documentation above.   Delice Lesch, MD, ABPMR Lavon Paganini Angiulli, PA-C 07/31/2020

## 2020-07-31 NOTE — Progress Notes (Signed)
Inpatient Rehab Admissions:  Inpatient Rehab Consult received.  I met with patient and her daughter at the bedside for rehabilitation assessment and to discuss goals and expectations of an inpatient rehab admission.  Pt has had inpatient rehab services at [facility] earlier this year for MI/CVA, as well as SNF level rehab for MI over the summer. Family is hopeful for CIR admission pending insurance authorization and bed availability.  Daughter reports that she and her brother have been providing 24/7 supervision to pt since [facility] admission this past spring.  Will submit for insurance auth and continue to follow.   Signed: Shann Medal, PT, DPT Admissions Coordinator [phone] 07/31/20  3:01 PM

## 2020-07-31 NOTE — Consult Note (Addendum)
Referring Physician: Dr. Maylene Roes    Chief Complaint: Stroke  HPI: Shawna Curry is an 86 y.o. 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 Had witnessed PEA arrest. MRI was then obtained. Neurology consulted 8/25 for MRI finding of acute small posterior right cerebral hemisphere ischemic infarction near the junction of the temporal, parietal, and occipital lobes (posterior MCA or border zone).   Per patient, she does not have any complaints, but she felt that she was not right. She could not stand up and move as quickly as she normally does. She has residual right arm weakness from stroke in March, but she does not c/o increased weakness or any new weakness. Per daughter, she noticed that her mother was having trouble feeding herself. Patient lives alone at home and is able to cook and clean and bathe herself at baseline. Patient had a CVA and MI in March of 2021, subsequently discharged to rehab. Then in May, she had another MI and was subsequently discharged to SNF. Has been back at home living alone with [facility] nurse once per day and therapies.  NIHSS: 7  MRI: Small acute posterior right cerebral hemisphere ischemic infarction near the junction of the temporal, parietal, and occipital lobes (posterior MCA or border zone).   BP: 99/64  Past Medical History:  Diagnosis Date  . Atrial fibrillation ([place])   . Coronary artery disease   . Hypertension     Past Surgical History:  Procedure Laterality Date  . ABDOMINAL HYSTERECTOMY    . HAND TENDON SURGERY    . JOINT REPLACEMENT     R hip x 2  . PERCUTANEOUS PLACEMENT INTRAVASCULAR STENT CERVICAL CAROTID ARTERY    . TONSILLECTOMY      Family History  Problem Relation Age of Onset  . Diabetes Mother    Social History:  reports that she has quit smoking. She has never used smokeless tobacco. She reports that she does not drink alcohol and does not  use drugs.  Allergies: No Known Allergies  Medications:  Scheduled: . acetaminophen  650 mg Oral Q4H  . amiodarone  400 mg Oral BID  . aspirin  81 mg Oral Daily  . atorvastatin  40 mg Oral Daily  . Chlorhexidine Gluconate Cloth  6 each Topical Daily  . gabapentin  300 mg Oral BID  . insulin aspart  0-15 Units Subcutaneous Q4H  . mouth rinse  15 mL Mouth Rinse q12n4p  . metoprolol succinate  100 mg Oral Daily   Continuous: . sodium chloride Stopped (07/27/20 1737)  . heparin 450 Units/hr (07/31/20 0850)   IHW:TUUEKC chloride, docusate, lip balm  ROS: ROS was performed and is negative except as noted in HPI   Physical Examination: Blood pressure 108/67, pulse (!) 48, temperature (!) 97.5 F (36.4 C), temperature source Oral, resp. rate 18, weight 51.1 kg, SpO2 100 %.  Neurologic Examination: Ment: Patient is awake, alert. Oriented to name. Unable to state year, age, or month. No dysarthria. Slightly aphasic. Difficulty naming some objects. She was able to name thumb and pen. Unable to name cup. Was able to describe what a key was used for but could not recall the word "key" when asked to identify a key visually. CN: PERRL 2 mm bilaterally, EOMI, face symmetric, tongue protrudes midline. VFF Motor: RUE 3/5 LUE 4+/5 BLE 4+/5 Sensation: intact to LT DTR: 1+ bilatera patellar, 2+ biceps Cerebellar: LUE  dysmetria, RUE unable to perform BLE unable to comprehend instructions.  Results for orders placed or performed during the hospital encounter of 07/27/20 (from the past 48 hour(s))  Basic metabolic panel     Status: Abnormal   Collection Time: 07/29/20  1:51 PM  Result Value Ref Range   Sodium 142 135 - 145 mmol/L   Potassium 4.7 3.5 - 5.1 mmol/L   Chloride 105 98 - 111 mmol/L   CO2 18 (L) 22 - 32 mmol/L   Glucose, Bld 196 (H) 70 - 99 mg/dL    Comment: Glucose reference range applies only to samples taken after fasting for at least 8 hours.   BUN 61 (H) 8 - 23 mg/dL    Creatinine, Ser 2.80 (H) 0.44 - 1.00 mg/dL   Calcium 8.5 (L) 8.9 - 10.3 mg/dL   GFR calc non Af Amer 15 (L) >60 mL/min   GFR calc Af Amer 17 (L) >60 mL/min   Anion gap 19 (H) 5 - 15    Comment: Performed at [facility] [address] [place], [place] [address]  APTT     Status: Abnormal   Collection Time: 07/29/20  1:51 PM  Result Value Ref Range   aPTT 90 (H) 24 - 36 seconds    Comment:        IF BASELINE aPTT IS ELEVATED, SUGGEST PATIENT RISK ASSESSMENT BE USED TO DETERMINE APPROPRIATE ANTICOAGULANT THERAPY. Performed at [facility], [place] [address] [place], [place] [address]   Glucose, capillary     Status: Abnormal   Collection Time: 07/29/20  3:40 PM  Result Value Ref Range   Glucose-Capillary 188 (H) 70 - 99 mg/dL    Comment: Glucose reference range applies only to samples taken after fasting for at least 8 hours.  Glucose, capillary     Status: Abnormal   Collection Time: 07/29/20  8:28 PM  Result Value Ref Range   Glucose-Capillary 115 (H) 70 - 99 mg/dL    Comment: Glucose reference range applies only to samples taken after fasting for at least 8 hours.  Magnesium     Status: None   Collection Time: 07/29/20 10:15 PM  Result Value Ref Range   Magnesium 2.3 1.7 - 2.4 mg/dL    Comment: Performed at [facility], [place] [address], [place], [place] [address]  Phosphorus     Status: None   Collection Time: 07/29/20 10:15 PM  Result Value Ref Range   Phosphorus 3.9 2.5 - 4.6 mg/dL    Comment: Performed at [facility] [address], [place], [place] [address]  APTT     Status: Abnormal   Collection Time: 07/29/20 10:15 PM  Result Value Ref Range   aPTT 66 (H) 24 - 36 seconds    Comment:        IF BASELINE aPTT IS ELEVATED, SUGGEST PATIENT RISK ASSESSMENT BE USED TO DETERMINE APPROPRIATE ANTICOAGULANT THERAPY. Performed at [facility], [place] [address], [place], [place] [address]   Glucose, capillary     Status: Abnormal   Collection  Time: 07/29/20 10:31 PM  Result Value Ref Range   Glucose-Capillary 130 (H) 70 - 99 mg/dL    Comment: Glucose reference range applies only to samples taken after fasting for at least 8 hours.  Glucose, capillary     Status: Abnormal   Collection Time: 07/30/20 12:04 AM  Result Value Ref Range   Glucose-Capillary 134 (H) 70 - 99 mg/dL  Comment: Glucose reference range applies only to samples taken after fasting for at least 8 hours.  Glucose, capillary     Status: Abnormal   Collection Time: 07/30/20  1:41 AM  Result Value Ref Range   Glucose-Capillary 125 (H) 70 - 99 mg/dL    Comment: Glucose reference range applies only to samples taken after fasting for at least 8 hours.  CBC     Status: Abnormal   Collection Time: 07/30/20  4:24 AM  Result Value Ref Range   WBC 18.6 (H) 4.0 - 10.5 K/uL    Comment: WHITE COUNT CONFIRMED ON SMEAR   RBC 3.44 (L) 3.87 - 5.11 MIL/uL   Hemoglobin 9.8 (L) 12.0 - 15.0 g/dL   HCT 29.5 (L) 36 - 46 %   MCV 85.8 80.0 - 100.0 fL   MCH 28.5 26.0 - 34.0 pg   MCHC 33.2 30.0 - 36.0 g/dL   RDW 16.5 (H) 11.5 - 15.5 %   Platelets 190 150 - 400 K/uL   nRBC 0.8 (H) 0.0 - 0.2 %    Comment: Performed at [facility] [address], [place], [place] [address]  Procalcitonin     Status: None   Collection Time: 07/30/20  4:24 AM  Result Value Ref Range   Procalcitonin 4.82 ng/mL    Comment:        Interpretation: PCT > 2 ng/mL: Systemic infection (sepsis) is likely, unless other causes are known. (NOTE)       Sepsis PCT Algorithm           Lower Respiratory Tract                                      Infection PCT Algorithm    ----------------------------     ----------------------------         PCT < 0.25 ng/mL                PCT < 0.10 ng/mL          Strongly encourage             Strongly discourage   discontinuation of antibiotics    initiation of antibiotics    ----------------------------     -----------------------------       PCT 0.25 - 0.50  ng/mL            PCT 0.10 - 0.25 ng/mL               OR       >80% decrease in PCT            Discourage initiation of                                            antibiotics      Encourage discontinuation           of antibiotics    ----------------------------     -----------------------------         PCT >= 0.50 ng/mL              PCT 0.26 - 0.50 ng/mL               AND       <80% decrease in PCT  Encourage initiation of                                             antibiotics       Encourage continuation           of antibiotics    ----------------------------     -----------------------------        PCT >= 0.50 ng/mL                  PCT > 0.50 ng/mL               AND         increase in PCT                  Strongly encourage                                      initiation of antibiotics    Strongly encourage escalation           of antibiotics                                     -----------------------------                                           PCT <= 0.25 ng/mL                                                 OR                                        > 80% decrease in PCT                                      Discontinue / Do not initiate                                             antibiotics  Performed at [facility], [address] [place], [place] [address]   Brain natriuretic peptide     Status: Abnormal   Collection Time: 07/30/20  4:24 AM  Result Value Ref Range   B Natriuretic Peptide >4,500.0 (H) 0.0 - 100.0 pg/mL    Comment: RESULT CONFIRMED BY AUTOMATED DILUTION Performed at [facility] [address] [place], [place] [address]   APTT     Status: Abnormal   Collection Time: 07/30/20  4:24 AM  Result Value Ref Range   aPTT 60 (H) 24 - 36 seconds    Comment:        IF BASELINE aPTT IS ELEVATED, SUGGEST PATIENT RISK ASSESSMENT BE USED TO  DETERMINE APPROPRIATE ANTICOAGULANT THERAPY. Performed at [facility], [place] [address] [place], [place] [address]   Glucose, capillary     Status: Abnormal   Collection Time: 07/30/20  4:32 AM  Result Value Ref Range   Glucose-Capillary 106 (H) 70 - 99 mg/dL    Comment: Glucose reference range applies only to samples taken after fasting for at least 8 hours.  Heparin level (unfractionated)     Status: Abnormal   Collection Time: 07/30/20  4:43 AM  Result Value Ref Range   Heparin Unfractionated 1.00 (H) 0.30 - 0.70 IU/mL    Comment: (NOTE) If heparin results are below expected values, and patient dosage has  been confirmed, suggest follow up testing of antithrombin III levels. Performed at [facility], [place] [address], [place], [place] [address]   Glucose, capillary     Status: Abnormal   Collection Time: 07/30/20  6:21 AM  Result Value Ref Range   Glucose-Capillary 110 (H) 70 - 99 mg/dL    Comment: Glucose reference range applies only to samples taken after fasting for at least 8 hours.  Glucose, capillary     Status: None   Collection Time: 07/30/20  7:56 AM  Result Value Ref Range   Glucose-Capillary 82 70 - 99 mg/dL    Comment: Glucose reference range applies only to samples taken after fasting for at least 8 hours.  Comprehensive metabolic panel     Status: Abnormal   Collection Time: 07/30/20  8:46 AM  Result Value Ref Range   Sodium 141 135 - 145 mmol/L   Potassium 3.7 3.5 - 5.1 mmol/L   Chloride 105 98 - 111 mmol/L   CO2 21 (L) 22 - 32 mmol/L   Glucose, Bld 116 (H) 70 - 99 mg/dL    Comment: Glucose reference range applies only to samples taken after fasting for at least 8 hours.   BUN 63 (H) 8 - 23 mg/dL   Creatinine, Ser 2.31 (H) 0.44 - 1.00 mg/dL   Calcium 8.6 (L) 8.9 - 10.3 mg/dL   Total Protein 5.5 (L) 6.5 - 8.1 g/dL   Albumin 3.3 (L) 3.5 - 5.0 g/dL   AST 107 (H) 15 - 41 U/L   ALT 105 (H) 0 - 44 U/L   Alkaline Phosphatase 125 38 - 126 U/L   Total Bilirubin 1.2 0.3 - 1.2 mg/dL   GFR calc non Af Amer 19 (L) >60 mL/min   GFR calc Af  Amer 22 (L) >60 mL/min   Anion gap 15 5 - 15    Comment: Performed at [facility] [address], [place], [place] [address]  Glucose, capillary     Status: Abnormal   Collection Time: 07/30/20 11:40 AM  Result Value Ref Range   Glucose-Capillary 120 (H) 70 - 99 mg/dL    Comment: Glucose reference range applies only to samples taken after fasting for at least 8 hours.  APTT     Status: Abnormal   Collection Time: 07/30/20  3:30 PM  Result Value Ref Range   aPTT 51 (H) 24 - 36 seconds    Comment:        IF BASELINE aPTT IS ELEVATED, SUGGEST PATIENT RISK ASSESSMENT BE USED TO DETERMINE APPROPRIATE ANTICOAGULANT THERAPY. Performed at [facility], [place] [address] [place], [place] [address]   Glucose, capillary     Status: Abnormal   Collection Time: 07/30/20  3:54 PM  Result Value Ref Range  Glucose-Capillary 111 (H) 70 - 99 mg/dL    Comment: Glucose reference range applies only to samples taken after fasting for at least 8 hours.  Glucose, capillary     Status: Abnormal   Collection Time: 07/30/20  8:51 PM  Result Value Ref Range   Glucose-Capillary 176 (H) 70 - 99 mg/dL    Comment: Glucose reference range applies only to samples taken after fasting for at least 8 hours.  Glucose, capillary     Status: None   Collection Time: 07/31/20 12:25 AM  Result Value Ref Range   Glucose-Capillary 87 70 - 99 mg/dL    Comment: Glucose reference range applies only to samples taken after fasting for at least 8 hours.  Glucose, capillary     Status: Abnormal   Collection Time: 07/31/20  4:06 AM  Result Value Ref Range   Glucose-Capillary 130 (H) 70 - 99 mg/dL    Comment: Glucose reference range applies only to samples taken after fasting for at least 8 hours.  CBC     Status: Abnormal   Collection Time: 07/31/20  6:27 AM  Result Value Ref Range   WBC 11.3 (H) 4.0 - 10.5 K/uL   RBC 3.51 (L) 3.87 - 5.11 MIL/uL   Hemoglobin 9.9 (L) 12.0 - 15.0 g/dL   HCT 30.3 (L) 36 - 46 %    MCV 86.3 80.0 - 100.0 fL   MCH 28.2 26.0 - 34.0 pg   MCHC 32.7 30.0 - 36.0 g/dL   RDW 17.2 (H) 11.5 - 15.5 %   Platelets 180 150 - 400 K/uL   nRBC 0.6 (H) 0.0 - 0.2 %    Comment: Performed at [facility] [address] [place], [place] [address]  Procalcitonin     Status: None   Collection Time: 07/31/20  6:27 AM  Result Value Ref Range   Procalcitonin 2.68 ng/mL    Comment:        Interpretation: PCT > 2 ng/mL: Systemic infection (sepsis) is likely, unless other causes are known. (NOTE)       Sepsis PCT Algorithm           Lower Respiratory Tract                                      Infection PCT Algorithm    ----------------------------     ----------------------------         PCT < 0.25 ng/mL                PCT < 0.10 ng/mL          Strongly encourage             Strongly discourage   discontinuation of antibiotics    initiation of antibiotics    ----------------------------     -----------------------------       PCT 0.25 - 0.50 ng/mL            PCT 0.10 - 0.25 ng/mL               OR       >80% decrease in PCT            Discourage initiation of  antibiotics      Encourage discontinuation           of antibiotics    ----------------------------     -----------------------------         PCT >= 0.50 ng/mL              PCT 0.26 - 0.50 ng/mL               AND       <80% decrease in PCT              Encourage initiation of                                             antibiotics       Encourage continuation           of antibiotics    ----------------------------     -----------------------------        PCT >= 0.50 ng/mL                  PCT > 0.50 ng/mL               AND         increase in PCT                  Strongly encourage                                      initiation of antibiotics    Strongly encourage escalation           of antibiotics                                     -----------------------------                                            PCT <= 0.25 ng/mL                                                 OR                                        > 80% decrease in PCT                                      Discontinue / Do not initiate                                             antibiotics  Performed at [facility], [place] [address] [place], [place] [address]   Brain natriuretic peptide     Status: Abnormal  Collection Time: 07/31/20  6:27 AM  Result Value Ref Range   B Natriuretic Peptide 2,982.0 (H) 0.0 - 100.0 pg/mL    Comment: Performed at [facility] [address] [place], [place] [address]  Comprehensive metabolic panel     Status: Abnormal   Collection Time: 07/31/20  6:27 AM  Result Value Ref Range   Sodium 140 135 - 145 mmol/L   Potassium 3.7 3.5 - 5.1 mmol/L   Chloride 106 98 - 111 mmol/L   CO2 22 22 - 32 mmol/L   Glucose, Bld 108 (H) 70 - 99 mg/dL    Comment: Glucose reference range applies only to samples taken after fasting for at least 8 hours.   BUN 57 (H) 8 - 23 mg/dL   Creatinine, Ser 1.91 (H) 0.44 - 1.00 mg/dL   Calcium 8.6 (L) 8.9 - 10.3 mg/dL   Total Protein 5.1 (L) 6.5 - 8.1 g/dL   Albumin 3.0 (L) 3.5 - 5.0 g/dL   AST 75 (H) 15 - 41 U/L   ALT 85 (H) 0 - 44 U/L   Alkaline Phosphatase 135 (H) 38 - 126 U/L   Total Bilirubin 0.7 0.3 - 1.2 mg/dL   GFR calc non Af Amer 23 (L) >60 mL/min   GFR calc Af Amer 27 (L) >60 mL/min   Anion gap 12 5 - 15    Comment: Performed at [facility] [address] [place], [place] [address]  Heparin level (unfractionated)     Status: None   Collection Time: 07/31/20  6:27 AM  Result Value Ref Range   Heparin Unfractionated 0.65 0.30 - 0.70 IU/mL    Comment: (NOTE) If heparin results are below expected values, and patient dosage has  been confirmed, suggest follow up testing of antithrombin III levels. Performed at [facility], [place] [address], [place], [place] [address]   APTT     Status: Abnormal    Collection Time: 07/31/20  6:27 AM  Result Value Ref Range   aPTT 45 (H) 24 - 36 seconds    Comment:        IF BASELINE aPTT IS ELEVATED, SUGGEST PATIENT RISK ASSESSMENT BE USED TO DETERMINE APPROPRIATE ANTICOAGULANT THERAPY. Performed at [facility], [place] [address], [place], [place] [address]   Glucose, capillary     Status: Abnormal   Collection Time: 07/31/20  7:56 AM  Result Value Ref Range   Glucose-Capillary 122 (H) 70 - 99 mg/dL    Comment: Glucose reference range applies only to samples taken after fasting for at least 8 hours.  Glucose, capillary     Status: Abnormal   Collection Time: 07/31/20 11:30 AM  Result Value Ref Range   Glucose-Capillary 68 (L) 70 - 99 mg/dL    Comment: Glucose reference range applies only to samples taken after fasting for at least 8 hours.   MR BRAIN WO CONTRAST  Result Date: 07/30/2020 CLINICAL DATA:  Cardiac arrest. History of atrial fibrillation and stroke. EXAM: MRI HEAD WITHOUT CONTRAST TECHNIQUE: Multiplanar, multiecho pulse sequences of the brain and surrounding structures were obtained without intravenous contrast. COMPARISON:  Head CT 07/28/2020 and MRI 02/19/2020 FINDINGS: The study could not be completed due to the patient's condition. Axial and coronal diffusion, axial T2* gradient echo, axial FLAIR, and coronal T2 sequences were obtained and are motion degraded including severe motion on the gradient echo sequence. Brain: There is a small acute cortical and subcortical infarct in the posterior  right cerebral hemisphere near the junction of the temporal, parietal, and occipital lobes (posterior MCA or border zone). No gross intracranial hemorrhage, mass/mass effect, or extra-axial fluid collection is identified. There is a chronic lacunar infarct at the lateral aspect of the left thalamus. T2 hyperintensities in the cerebral white matter bilaterally are similar to the prior MRI and are nonspecific but compatible with mild-to-moderate  chronic small vessel ischemic disease. There is mild cerebral atrophy. A chronic lacunar infarct or cyst in the left subinsular region is unchanged. Mild cerebral atrophy is not considered abnormal for age. Vascular: Limited assessment on this motion degraded, incomplete examination. Skull and upper cervical spine: No gross destructive skull lesion. Sinuses/Orbits: No acute findings. Other: None. IMPRESSION: 1. Motion degraded, incomplete examination. 2. Small acute posterior right cerebral infarct. 3. Mild-to-moderate chronic small vessel ischemic disease. Electronically Signed   By: Logan Bores M.D.   On: 07/30/2020 15:16   US RENAL  Result Date: 07/29/2020 CLINICAL DATA:  Acute kidney injury. EXAM: RENAL / URINARY TRACT ULTRASOUND COMPLETE COMPARISON:  February 17, 2020. FINDINGS: Right Kidney: Renal measurements: 7.9 x 4.8 x 4.4 cm = volume: 87 mL. Increased echogenicity of renal parenchyma is noted. 1.1 cm simple cyst is noted in lower pole. No mass or hydronephrosis visualized. Left Kidney: Renal measurements: 9.3 x 4.4 x 4.2 cm = volume: 88 mL. Increased echogenicity of renal parenchyma is noted. No mass or hydronephrosis visualized. Bladder: Decompressed secondary to Foley catheter. Other: Minimal ascites is noted in the right upper quadrant. IMPRESSION: Increased echogenicity of renal parenchyma is noted bilaterally suggesting medical renal disease. Mild right renal atrophy is noted. No hydronephrosis or renal obstruction is noted. Electronically Signed   By: Marijo Conception M.D.   On: 07/29/2020 12:29   DG Chest Port 1 View  Result Date: 07/31/2020 CLINICAL DATA:  Respiratory failure. EXAM: PORTABLE CHEST 1 VIEW COMPARISON:  July 29, 2020. FINDINGS: Stable cardiomegaly. No pneumothorax is noted. Endotracheal and nasogastric tubes have been removed. Mild bibasilar subsegmental atelectasis is noted. Small right pleural effusion is noted. Degenerative changes are seen involving both glenohumeral  joints. IMPRESSION: Mild bibasilar subsegmental atelectasis. Small right pleural effusion. Endotracheal and nasogastric tubes have been removed. Aortic Atherosclerosis (ICD10-I70.0). Electronically Signed   By: Marijo Conception M.D.   On: 07/31/2020 08:27   ECHOCARDIOGRAM LIMITED  Result Date: 07/31/2020    ECHOCARDIOGRAM LIMITED REPORT   Patient Name:   Shawna Curry Date of Exam: 07/31/2020 Medical Rec #:  [id number]     Height:       68.0 in Accession #:    [id number]    Weight:       112.7 lb Date of Birth:  11/09/1933     BSA:          1.601 m Patient Age:    85 years      BP:           106/67 mmHg Patient Gender: F             HR:           50 bpm. Exam Location:  Inpatient Procedure: Limited Echo, Cardiac Doppler and Color Doppler Indications:    Mitral valve insufficiency  History:        Patient has prior history of Echocardiogram examinations, most                 recent 07/28/2020. Risk Factors:Former Smoker. Sepsis.  Respiratory failure. Acute systolic heart failure.  Sonographer:    Clayton Lefort RDCS (AE) Referring Phys: [id number] [place]  1. The PMVL is restricted in systole (IIIB pathology). The regurgitation jet is central (A2-P2) and directed centrally/posteriorly. The mitral valve is degenerative. Severe mitral valve regurgitation. No evidence of mitral stenosis.  2. Left ventricular ejection fraction, by estimation, is 35 to 40%. The left ventricle has moderately decreased function. The left ventricle demonstrates global hypokinesis. There is mild concentric left ventricular hypertrophy. There is the interventricular septum is flattened in systole and diastole, consistent with right ventricular pressure and volume overload.  3. Right ventricular systolic function is moderately reduced. The right ventricular size is moderately enlarged. There is moderately elevated pulmonary artery systolic pressure. The estimated right ventricular systolic pressure is 30.1 mmHg.  4.  Left atrial size was severely dilated.  5. Right atrial size was severely dilated.  6. Tricuspid valve regurgitation is moderate to severe.  7. The aortic valve is tricuspid. Aortic valve regurgitation is not visualized. Mild aortic valve stenosis.  8. The inferior vena cava is dilated in size with <50% respiratory variability, suggesting right atrial pressure of 15 mmHg. Comparison(s): No significant change from prior study. MR remains severe. Moderate to severe TR. RVSP improved. FINDINGS  Left Ventricle: Left ventricular ejection fraction, by estimation, is 35 to 40%. The left ventricle has moderately decreased function. The left ventricle demonstrates global hypokinesis. The left ventricular internal cavity size was normal in size. There is mild concentric left ventricular hypertrophy. The interventricular septum is flattened in systole and diastole, consistent with right ventricular pressure and volume overload. Right Ventricle: The right ventricular size is moderately enlarged. No increase in right ventricular wall thickness. Right ventricular systolic function is moderately reduced. There is moderately elevated pulmonary artery systolic pressure. The tricuspid  regurgitant velocity is 2.79 m/s, and with an assumed right atrial pressure of 15 mmHg, the estimated right ventricular systolic pressure is 60.1 mmHg. Left Atrium: Left atrial size was severely dilated. Right Atrium: Right atrial size was severely dilated. Pericardium: Trivial pericardial effusion is present. Mitral Valve: The PMVL is restricted in systole (IIIB pathology). The regurgitation jet is central (A2-P2) and directed centrally/posteriorly. The mitral valve is degenerative in appearance. There is mild calcification of the anterior and posterior mitral valve leaflet(s). Severe mitral valve regurgitation. No evidence of mitral valve stenosis. Tricuspid Valve: The tricuspid valve is grossly normal. Tricuspid valve regurgitation is moderate to  severe. Aortic Valve: The aortic valve is tricuspid. . There is moderate thickening and moderate calcification of the aortic valve. Aortic valve regurgitation is not visualized. Mild aortic stenosis is present. There is moderate thickening of the aortic valve. There is moderate calcification of the aortic valve. Pulmonic Valve: The pulmonic valve was grossly normal. Pulmonic valve regurgitation is trivial. No evidence of pulmonic stenosis. Venous: The inferior vena cava is dilated in size with less than 50% respiratory variability, suggesting right atrial pressure of 15 mmHg. Additional Comments: There is a small pleural effusion in the left lateral region. LEFT VENTRICLE PLAX 2D LVIDd:         4.60 cm LVIDs:         3.10 cm LV PW:         1.10 cm LV IVS:        1.40 cm LVOT diam:     2.00 cm LVOT Area:     3.14 cm  LV Volumes (MOD) LV vol d, MOD A2C: 62.7 ml LV  vol d, MOD A4C: 83.7 ml LV vol s, MOD A2C: 33.8 ml LV vol s, MOD A4C: 45.1 ml LV SV MOD A2C:     28.9 ml LV SV MOD A4C:     83.7 ml LV SV MOD BP:      32.1 ml IVC IVC diam: 2.30 cm LEFT ATRIUM         Index LA diam:    4.00 cm 2.50 cm/m   AORTA Ao Root diam: 3.00 cm MR Peak grad:    88.4 mmHg   TRICUSPID VALVE MR Mean grad:    51.0 mmHg   TR Peak grad:   31.1 mmHg MR Vmax:         470.00 cm/s TR Vmax:        279.00 cm/s MR Vmean:        329.0 cm/s MR PISA:         4.02 cm    SHUNTS MR PISA Eff ROA: 34 mm      Systemic Diam: 2.00 cm MR PISA Radius:  0.80 cm Eleonore Chiquito MD Electronically signed by Eleonore Chiquito MD Signature Date/Time: 07/31/2020/11:26:10 AM    Final     Laurey Morale, MSN, NP-C [facility] [phone]   Assessment: 86 y.o. 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 Had witnessed PEA arrest. MRI was then obtained. Neurology consulted 8/25 for MRI finding of acute small posterior right cerebral hemisphere ischemic  infarction near the junction of the temporal, parietal, and occipital lobes (posterior MCA or border zone).  1. Exam reveals LUE dysmetria, mild aphasia and trouble with naming some objects. RUE unable to break gravity without assistance (residual from CVA 02/2020) 2. MRI brain reveals a small acute cortical and subcortical infarct in the posterior right cerebral hemisphere near the junction of the temporal, parietal, and occipital lobes (posterior MCA or border zone).  3. Also noted on MRI are the following chronic changes: Chronic lacunar infarct at the lateral aspect of the left thalamus. T2 hyperintensities in the cerebral white matter bilaterally are similar to the prior MRI and are nonspecific but compatible with mild-to-moderate chronic small vessel ischemic disease. There is mild cerebral atrophy. A chronic lacunar infarct or cyst in the left subinsular region is unchanged. 4. Stroke Risk Factors - atrial fibrillation, hyperlipidemia and hypertension  Recommendations: 1. HgbA1c, fasting lipid panel 2. MRA of the brain without contrast 3. PT consult, OT consult, Speech consult 4. Carotid dopplers 6. Prophylactic therapy - Continue Eliquis 7. Risk factor modification 8. Telemetry monitoring 9. Frequent neuro checks    @Electronically  signed: Dr. Kerney Elbe  07/31/2020, 12:18 PM

## 2020-07-31 NOTE — Progress Notes (Signed)
ANTICOAGULATION CONSULT NOTE  Pharmacy Consult for Heparin Indication: chest pain/ACS, atrial fibrillation  No Known Allergies  Patient Measurements: Weight: 51.1 kg (112 lb 10.5 oz) Heparin Dosing Weight: 48 kg  Vital Signs: Temp: 97.8 F (36.6 C) (08/25 1630) Temp Source: Oral (08/25 1630) BP: 107/63 (08/25 1630) Pulse Rate: 51 (08/25 1630)  Labs: Recent Labs    07/29/20 0236 07/29/20 0236 07/29/20 1049 07/29/20 1351 07/29/20 2215 07/30/20 0424 07/30/20 0424 07/30/20 0443 07/30/20 0846 07/30/20 1530 07/31/20 0627 07/31/20 1830  HGB 10.2*   < > 9.4*  --   --  9.8*  --   --   --   --  9.9*  --   HCT 33.6*   < > 29.9*  --   --  29.5*  --   --   --   --  30.3*  --   PLT 209   < > 198  --   --  190  --   --   --   --  180  --   APTT >200*   < >  --  90*   < > 60*   < >  --   --  51* 45* 48*  HEPARINUNFRC >2.20*  --   --   --   --   --   --  1.00*  --   --  0.65  --   CREATININE  --   --   --  2.80*  --   --   --   --  2.31*  --  1.91*  --    < > = values in this interval not displayed.    Estimated Creatinine Clearance: 17.4 mL/min (A) (by C-G formula based on SCr of 1.91 mg/dL (H)).   Medical History: Past Medical History:  Diagnosis Date  . Atrial fibrillation ([place])   . Coronary artery disease   . Hypertension     Assessment: 86 year old woman with hx of CAD, hx of cardiac stent, atrial fibrillation, HFpEF, HLD, HTN, PVD presented with SOB and subsequently suffered PEA arrest; received intubation and CPR in ED. Noted history of afib, anticoagulation note from 07/23/20 indicates that she was on apixaban prior to admit.  aPTT ~9.5 hrs after heparin infusion was increased to 450 units/hr was 48 sec, which is below the goal range for this pt. H/H, platelets stable.  Per RN, no issues with IV or bleeding observed.  Goal of Therapy:  Heparin level 0.3-0.7 units/ml aPTT 66-102 seconds Monitor platelets by anticoagulation protocol: Yes   Plan:  Increase heparin  infusion to 550 units/hr Check 8-hr aPTT, heparin level Monitor daily aPTT, heparin level, CBC Monitor for signs/symptoms of bleeding  Gillermina Hu, PharmD, BCPS, [facility] Clinical Pharmacist 07/31/20, 20:30 PM

## 2020-07-31 NOTE — Progress Notes (Signed)
[facility] for Heparin Indication: chest pain/ACS, atrial fibrillation  No Known Allergies  Patient Measurements: Weight: 51.1 kg (112 lb 10.5 oz) Heparin Dosing Weight: 48 kg  Vital Signs: Temp: 97.7 F (36.5 C) (08/25 0758) Temp Source: Oral (08/25 0758) BP: 115/59 (08/25 0758) Pulse Rate: 51 (08/25 0758)  Labs: Recent Labs    07/28/20 1044 07/28/20 1517 07/29/20 0236 07/29/20 0236 07/29/20 1049 07/29/20 1351 07/29/20 2215 07/30/20 0424 07/30/20 0443 07/30/20 0846 07/30/20 1530 07/31/20 0627  HGB  --   --  10.2*   < > 9.4*  --   --  9.8*  --   --   --  9.9*  HCT  --   --  33.6*   < > 29.9*  --   --  29.5*  --   --   --  30.3*  PLT  --   --  209   < > 198  --   --  190  --   --   --  180  APTT  --    < > >200*   < >  --  90*   < > 60*  --   --  51* 45*  HEPARINUNFRC  --    < > >2.20*  --   --   --   --   --  1.00*  --   --  0.65  CREATININE  --   --   --   --   --  2.80*  --   --   --  2.31*  --  1.91*  CKTOTAL 54  --   --   --   --   --   --   --   --   --   --   --    < > = values in this interval not displayed.    Estimated Creatinine Clearance: 17.4 mL/min (A) (by C-G formula based on SCr of 1.91 mg/dL (H)).   Medical History: Past Medical History:  Diagnosis Date  . Atrial fibrillation ([place])   . Coronary artery disease   . Hypertension     Assessment: 86 year old woman with hx of CAD, hx of cardiac stent, atrial fibrillation, HFpEF, HLD, HTN, PVD presented with SOB and subsequently suffered PEA arrest; received intubation and CPR in ED. Noted history of afib, anticoagulation note from 07/23/20 indicates that she was on apixaban prior to admit.  Morning aPTT subtherapeutic, CBC stable, heparin level still slightly falsely elevated.  Goal of Therapy:  Heparin level 0.3-0.7 units/ml aPTT 66-102 seconds Monitor platelets by anticoagulation protocol: Yes   Plan:  -Increase heparin to 450 units/h -Recheck aPTT in  8h   Arrie Senate, PharmD, BCPS Clinical Pharmacist [phone] Please check AMION for all [place] numbers 07/31/2020

## 2020-07-31 NOTE — Progress Notes (Signed)
PROGRESS NOTE    Caliann Leckrone  EUM:[id number] DOB: 06-24-1933 DOA: 07/27/2020 PCP: Iva Lento, PA-C     Brief Narrative:  Naydeen Speirs is an 86 year old woman with history of CAD, history of cardiac stent, A Fib, previous left PCA stroke in March 2021, HLD, HTN, PAD who presented to the ED with shortness of breath. She suffered PEA arrest in the ED and was intubated, admitted to ICU on 8/21. Arrest without known etiology but possibly arrhythmogenic cause. Cardiology consulted. Patient transferred to progressive unit and TRH service on 8/25.   New events last 24 hours / Subjective: Patient voices no concerns or new complaints. Denies shortness of breath, has some intermittent CP which is not currently present, and tolerated breakfast this morning. Admits to residual RUE weakness from previous stroke but no new deficits that she is able to notice. Per PT notes, she had bilateral upper extremity and truncal ataxia with inability to stand or transfer out of chair.   Assessment & Plan:   Active Problems:   Sepsis ([place])   Respiratory failure ([place])   AKI (acute kidney injury) ([place])   Acute systolic heart failure (HCC)   S/p PEA cardiac arrest  -Stable and off vent support   Acute hypoxemic respiratory failure -Resolved, on room air today   Acute right posterior cerebral stroke -Hx of left PCA stroke in March 2021 at [facility]: Small acute posterior right cerebral infarct. Motion degraded incomplete exam.  -Plavix and eliquis PTA. Currently on aspirin and IV heparin  -Consult neurology  -Lipitor -CIR in review   Paroxysmal A Fib -IV heparin  -Amiodarone, toprol  -Cardiology following   Acute on chronic systolic CHF  -BNP > 5400, improving now  -Echo EF 35-40%, global hypokinesis of LV, severe MR  -Lasix IV given 8/25  -Toprol  -Cardiology following   Acute metabolic encephalopathy  -S/p arrest -Seems to be back to her baseline mentation currently    AKI -Renal US: Increased echogenicity of renal parenchyma is noted bilaterally suggesting medical renal disease -Improving, Cr peaked at 2.8 and trended down to 1.91, trend BMP   DM -Novolog SSI   Transaminitis -Likely secondary to cardiac arrest. Improving LFTs   Leukocytosis  -Blood cultures negative -Urine culture negative -Respiratory failure negative  -Improving, down to 11.3 today  Elevated TSH -Need repeat outpatient follow up once above acute medical issues resolved      DVT prophylaxis: IV heparin   Code Status: Full Family Communication: Updated daughter over the phone 8/25  Disposition Plan:   Status is: Inpatient  Remains inpatient appropriate because:Ongoing diagnostic testing needed not appropriate for outpatient work up and Inpatient level of care appropriate due to severity of illness   Dispo: The patient is from: Home              Anticipated d/c is to: CIR              Anticipated d/c date is: 2 days              Patient currently is not medically stable to d/c. Stroke work up pending, neurology consulted today.    Consultants:   PCCM   Cardiology  Neurology    Antimicrobials:  Anti-infectives (From admission, onward)   Start     Dose/Rate Route Frequency Ordered Stop   07/29/20 2000  vancomycin (VANCOREADY) IVPB 500 mg/100 mL  Status:  Discontinued        500 mg 100 mL/hr  over 60 Minutes Intravenous Every 48 hours 07/27/20 2055 07/28/20 0937   07/27/20 2200  vancomycin (VANCOCIN) IVPB 1000 mg/200 mL premix        1,000 mg 200 mL/hr over 60 Minutes Intravenous  Once 07/27/20 2055 07/28/20 0041   07/27/20 2200  ceFEPIme (MAXIPIME) 1 g in sodium chloride 0.9 % 100 mL IVPB  Status:  Discontinued        1 g 200 mL/hr over 30 Minutes Intravenous Every 24 hours 07/27/20 2055 07/28/20 0937   07/27/20 1530  cefTRIAXone (ROCEPHIN) 1 g in sodium chloride 0.9 % 100 mL IVPB        1 g 200 mL/hr over 30 Minutes Intravenous  Once 07/27/20 1529  07/27/20 1618   07/27/20 1530  azithromycin (ZITHROMAX) 500 mg in sodium chloride 0.9 % 250 mL IVPB        500 mg 250 mL/hr over 60 Minutes Intravenous  Once 07/27/20 1529 07/27/20 1731        Objective: Vitals:   07/31/20 0411 07/31/20 0758 07/31/20 0857 07/31/20 1132  BP: 106/67 (!) 115/59 106/82 108/67  Pulse: (!) 50 (!) 51  (!) 48  Resp: 16 18  18   Temp: 97.7 F (36.5 C) 97.7 F (36.5 C)  (!) 97.5 F (36.4 C)  TempSrc: Oral Oral  Oral  SpO2: 94% 98%  100%  Weight:        Intake/Output Summary (Last 24 hours) at 07/31/2020 1136 Last data filed at 07/31/2020 0850 Gross per 24 hour  Intake 254.61 ml  Output 700 ml  Net -445.39 ml   Filed Weights   07/28/20 0355 07/29/20 0500 07/30/20 0500  Weight: 52 kg 53.7 kg 51.1 kg    Examination:  General exam: Appears calm and comfortable  Respiratory system: Clear to auscultation. Respiratory effort normal. No respiratory distress. No conversational dyspnea. On room air  Cardiovascular system: S1 & S2 heard, sinus bradycardia, rate 50s.  Gastrointestinal system: Abdomen is nondistended, soft and nontender. Normal bowel sounds heard. Central nervous system: Alert and oriented.  Speech clear, cranial nerves grossly intact, right upper extremity hemiparesis which is reportedly chronic in nature Extremities: Symmetric in appearance  Skin: No rashes, lesions or ulcers on exposed skin  Psychiatry: Mood & affect appropriate.   Data Reviewed: I have personally reviewed following labs and imaging studies  CBC: Recent Labs  Lab 07/27/20 1250 07/27/20 1811 07/28/20 0511 07/29/20 0236 07/29/20 1049 07/30/20 0424 07/31/20 0627  WBC 6.1   < > 12.5* 24.5* 23.5* 18.6* 11.3*  NEUTROABS 4.8  --   --   --   --   --   --   HGB 10.3*   < > 10.1* 10.2* 9.4* 9.8* 9.9*  HCT 32.8*   < > 30.1* 33.6* 29.9* 29.5* 30.3*  MCV 89.1   < > 85.8 92.6 88.7 85.8 86.3  PLT 221   < > 233 209 198 190 180   < > = values in this interval not displayed.     Basic Metabolic Panel: Recent Labs  Lab 07/28/20 0110 07/28/20 0110 07/28/20 0333 07/28/20 0653 07/28/20 1044 07/28/20 1517 07/29/20 0236 07/29/20 1351 07/29/20 2215 07/30/20 0846 07/31/20 0627  NA 140   < > 141 143  --   --   --  142  --  141 140  K 3.8   < > 3.4* 3.7  --   --   --  4.7  --  3.7 3.7  CL 104  --   --  103  --   --   --  105  --  105 106  CO2 19*  --   --  20*  --   --   --  18*  --  21* 22  GLUCOSE 230*  --   --  216*  --   --   --  196*  --  116* 108*  BUN 45*  --   --  45*  --   --   --  61*  --  63* 57*  CREATININE 1.89*  --   --  2.04*  --   --   --  2.80*  --  2.31* 1.91*  CALCIUM 8.2*  --   --  8.5*  --   --   --  8.5*  --  8.6* 8.6*  MG 1.6*   < >  --  2.3 2.5* 2.8* 2.8*  --  2.3  --   --   PHOS 3.6   < >  --  3.8 4.1 6.0* 7.2*  --  3.9  --   --    < > = values in this interval not displayed.   GFR: Estimated Creatinine Clearance: 17.4 mL/min (A) (by C-G formula based on SCr of 1.91 mg/dL (H)). Liver Function Tests: Recent Labs  Lab 07/25/20 1332 07/27/20 1250 07/28/20 0110 07/30/20 0846 07/31/20 0627  AST 53* 107* 184* 107* 75*  ALT 44 69* 120* 105* 85*  ALKPHOS 126 145* 130* 125 135*  BILITOT 1.1 1.1 1.4* 1.2 0.7  PROT 6.9 6.4* 5.4* 5.5* 5.1*  ALBUMIN 4.0 3.9 3.1* 3.3* 3.0*   Recent Labs  Lab 07/27/20 1250  LIPASE 43   No results for input(s): AMMONIA in the last 168 hours. Coagulation Profile: No results for input(s): INR, PROTIME in the last 168 hours. Cardiac Enzymes: Recent Labs  Lab 07/28/20 1044  CKTOTAL 54   BNP (last 3 results) No results for input(s): PROBNP in the last 8760 hours. HbA1C: No results for input(s): HGBA1C in the last 72 hours. CBG: Recent Labs  Lab 07/30/20 2051 07/31/20 0025 07/31/20 0406 07/31/20 0756 07/31/20 1130  GLUCAP 176* 87 130* 122* 68*   Lipid Profile: No results for input(s): CHOL, HDL, LDLCALC, TRIG, CHOLHDL, LDLDIRECT in the last 72 hours. Thyroid Function Tests: No results  for input(s): TSH, T4TOTAL, FREET4, T3FREE, THYROIDAB in the last 72 hours. Anemia Panel: No results for input(s): VITAMINB12, FOLATE, FERRITIN, TIBC, IRON, RETICCTPCT in the last 72 hours. Sepsis Labs: Recent Labs  Lab 07/27/20 1250 07/27/20 1548 07/28/20 0110 07/28/20 0511 07/29/20 1049 07/30/20 0424 07/31/20 0627  PROCALCITON  --   --   --   --  4.55 4.82 2.68  LATICACIDVEN 5.9* 2.8* 3.7* 2.6*  --   --   --     Recent Results (from the past 240 hour(s))  SARS Coronavirus 2 by RT PCR (hospital order, performed in [place] hospital lab) Nasopharyngeal Nasopharyngeal Swab     Status: None   Collection Time: 07/25/20  2:21 PM   Specimen: Nasopharyngeal Swab  Result Value Ref Range Status   SARS Coronavirus 2 NEGATIVE NEGATIVE Final    Comment: (NOTE) SARS-CoV-2 target nucleic acids are NOT DETECTED.  The SARS-CoV-2 RNA is generally detectable in upper and lower respiratory specimens during the acute phase of infection. The lowest concentration of SARS-CoV-2 viral copies this assay can detect is 250 copies / mL. A negative result does not preclude SARS-CoV-2 infection and should not be  used as the sole basis for treatment or other patient management decisions.  A negative result may occur with improper specimen collection / handling, submission of specimen other than nasopharyngeal swab, presence of viral mutation(s) within the areas targeted by this assay, and inadequate number of viral copies (<250 copies / mL). A negative result must be combined with clinical observations, patient history, and epidemiological information.  Fact Sheet for Patients:   StrictlyIdeas.no  Fact Sheet for Healthcare Providers: BankingDealers.co.za  This test is not yet approved or  cleared by the [place] FDA and has been authorized for detection and/or diagnosis of SARS-CoV-2 by FDA under an Emergency Use Authorization (EUA).  This EUA  will remain in effect (meaning this test can be used) for the duration of the COVID-19 declaration under Section 564(b)(1) of the Act, 21 U.S.C. section 360bbb-3(b)(1), unless the authorization is terminated or revoked sooner.  Performed at [facility], [address] [place], [place] [address]   Blood culture (routine x 2)     Status: None (Preliminary result)   Collection Time: 07/27/20 12:45 PM   Specimen: BLOOD RIGHT FOREARM  Result Value Ref Range Status   Specimen Description   Final    BLOOD RIGHT FOREARM Performed at [facility], [address] [place], [place] [address]    Special Requests   Final    BOTTLES DRAWN AEROBIC AND ANAEROBIC Blood Culture adequate volume Performed at [facility], [address] [place], [place] [address]    Culture   Final    NO GROWTH 4 DAYS Performed at [facility], [place] [address], [place], [place] [address]    Report Status PENDING  Incomplete  SARS Coronavirus 2 by RT PCR (hospital order, performed in [facility] hospital lab) Nasopharyngeal Nasopharyngeal Swab     Status: None   Collection Time: 07/27/20 12:50 PM   Specimen: Nasopharyngeal Swab  Result Value Ref Range Status   SARS Coronavirus 2 NEGATIVE NEGATIVE Final    Comment: (NOTE) SARS-CoV-2 target nucleic acids are NOT DETECTED.  The SARS-CoV-2 RNA is generally detectable in upper and lower respiratory specimens during the acute phase of infection. The lowest concentration of SARS-CoV-2 viral copies this assay can detect is 250 copies / mL. A negative result does not preclude SARS-CoV-2 infection and should not be used as the sole basis for treatment or other patient management decisions.  A negative result may occur with improper specimen collection / handling, submission of specimen other than nasopharyngeal swab, presence of viral mutation(s) within the areas targeted by this assay, and inadequate number of viral copies (<250  copies / mL). A negative result must be combined with clinical observations, patient history, and epidemiological information.  Fact Sheet for Patients:   StrictlyIdeas.no  Fact Sheet for Healthcare Providers: BankingDealers.co.za  This test is not yet approved or  cleared by the [place] FDA and has been authorized for detection and/or diagnosis of SARS-CoV-2 by FDA under an Emergency Use Authorization (EUA).  This EUA will remain in effect (meaning this test can be used) for the duration of the COVID-19 declaration under Section 564(b)(1) of the Act, 21 U.S.C. section 360bbb-3(b)(1), unless the authorization is terminated or revoked sooner.  Performed at [facility] [address] [place], [place] [address]   Blood culture (routine x 2)     Status: None (Preliminary result)   Collection Time: 07/27/20  1:00 PM  Specimen: BLOOD RIGHT FOREARM  Result Value Ref Range Status   Specimen Description   Final    BLOOD RIGHT FOREARM Performed at [facility], [address] [place], [place] [address]    Special Requests   Final    BOTTLES DRAWN AEROBIC ONLY Blood Culture results may not be optimal due to an inadequate volume of blood received in culture bottles Performed at [facility], [address] [place], [place] [address]    Culture   Final    NO GROWTH 4 DAYS Performed at [facility], [place] [address], [place], [place] [address]    Report Status PENDING  Incomplete  MRSA PCR Screening     Status: None   Collection Time: 07/27/20  7:53 PM   Specimen: Nasal Mucosa; Nasopharyngeal  Result Value Ref Range Status   MRSA by PCR NEGATIVE NEGATIVE Final    Comment:        The GeneXpert MRSA Assay (FDA approved for NASAL specimens only), is one component of a comprehensive MRSA colonization surveillance program. It is not intended to diagnose MRSA infection nor to guide or monitor  treatment for MRSA infections. Performed at [facility], [place] [address], [place], [place] [address]   Urine Culture     Status: None   Collection Time: 07/29/20 11:11 AM   Specimen: Urine, Catheterized  Result Value Ref Range Status   Specimen Description URINE, CATHETERIZED  Final   Special Requests NONE  Final   Culture   Final    NO GROWTH Performed at [facility], [address] [place], [place] [address]    Report Status 07/30/2020 FINAL  Final  Culture, respiratory (non-expectorated)     Status: None (Preliminary result)   Collection Time: 07/29/20 11:38 AM   Specimen: Tracheal Aspirate; Respiratory  Result Value Ref Range Status   Specimen Description TRACHEAL ASPIRATE  Final   Special Requests NONE  Final   Gram Stain   Final    ABUNDANT WBC PRESENT,BOTH PMN AND MONONUCLEAR NO ORGANISMS SEEN    Culture   Final    CULTURE REINCUBATED FOR BETTER GROWTH Performed at [facility], [address], [place], [place] [address]    Report Status PENDING  Incomplete      Radiology Studies: MR BRAIN WO CONTRAST  Result Date: 07/30/2020 CLINICAL DATA:  Cardiac arrest. History of atrial fibrillation and stroke. EXAM: MRI HEAD WITHOUT CONTRAST TECHNIQUE: Multiplanar, multiecho pulse sequences of the brain and surrounding structures were obtained without intravenous contrast. COMPARISON:  Head CT 07/28/2020 and MRI 02/19/2020 FINDINGS: The study could not be completed due to the patient's condition. Axial and coronal diffusion, axial T2* gradient echo, axial FLAIR, and coronal T2 sequences were obtained and are motion degraded including severe motion on the gradient echo sequence. Brain: There is a small acute cortical and subcortical infarct in the posterior right cerebral hemisphere near the junction of the temporal, parietal, and occipital lobes (posterior MCA or border zone). No gross intracranial hemorrhage, mass/mass effect, or extra-axial fluid collection is  identified. There is a chronic lacunar infarct at the lateral aspect of the left thalamus. T2 hyperintensities in the cerebral white matter bilaterally are similar to the prior MRI and are nonspecific but compatible with mild-to-moderate chronic small vessel ischemic disease. There is mild cerebral atrophy. A chronic lacunar infarct or cyst in the left subinsular region is unchanged. Mild cerebral atrophy is not considered abnormal  for age. Vascular: Limited assessment on this motion degraded, incomplete examination. Skull and upper cervical spine: No gross destructive skull lesion. Sinuses/Orbits: No acute findings. Other: None. IMPRESSION: 1. Motion degraded, incomplete examination. 2. Small acute posterior right cerebral infarct. 3. Mild-to-moderate chronic small vessel ischemic disease. Electronically Signed   By: Logan Bores M.D.   On: 07/30/2020 15:16   US RENAL  Result Date: 07/29/2020 CLINICAL DATA:  Acute kidney injury. EXAM: RENAL / URINARY TRACT ULTRASOUND COMPLETE COMPARISON:  February 17, 2020. FINDINGS: Right Kidney: Renal measurements: 7.9 x 4.8 x 4.4 cm = volume: 87 mL. Increased echogenicity of renal parenchyma is noted. 1.1 cm simple cyst is noted in lower pole. No mass or hydronephrosis visualized. Left Kidney: Renal measurements: 9.3 x 4.4 x 4.2 cm = volume: 88 mL. Increased echogenicity of renal parenchyma is noted. No mass or hydronephrosis visualized. Bladder: Decompressed secondary to Foley catheter. Other: Minimal ascites is noted in the right upper quadrant. IMPRESSION: Increased echogenicity of renal parenchyma is noted bilaterally suggesting medical renal disease. Mild right renal atrophy is noted. No hydronephrosis or renal obstruction is noted. Electronically Signed   By: Marijo Conception M.D.   On: 07/29/2020 12:29   DG Chest Port 1 View  Result Date: 07/31/2020 CLINICAL DATA:  Respiratory failure. EXAM: PORTABLE CHEST 1 VIEW COMPARISON:  July 29, 2020. FINDINGS: Stable  cardiomegaly. No pneumothorax is noted. Endotracheal and nasogastric tubes have been removed. Mild bibasilar subsegmental atelectasis is noted. Small right pleural effusion is noted. Degenerative changes are seen involving both glenohumeral joints. IMPRESSION: Mild bibasilar subsegmental atelectasis. Small right pleural effusion. Endotracheal and nasogastric tubes have been removed. Aortic Atherosclerosis (ICD10-I70.0). Electronically Signed   By: Marijo Conception M.D.   On: 07/31/2020 08:27   ECHOCARDIOGRAM LIMITED  Result Date: 07/31/2020    ECHOCARDIOGRAM LIMITED REPORT   Patient Name:   LAVELL Soltero Date of Exam: 07/31/2020 Medical Rec #:  [id number]     Height:       68.0 in Accession #:    [id number]    Weight:       112.7 lb Date of Birth:  [birth year]     BSA:          1.601 m Patient Age:    73 years      BP:           106/67 mmHg Patient Gender: F             HR:           50 bpm. Exam Location:  Inpatient Procedure: Limited Echo, Cardiac Doppler and Color Doppler Indications:    Mitral valve insufficiency  History:        Patient has prior history of Echocardiogram examinations, most                 recent 07/28/2020. Risk Factors:Former Smoker. Sepsis.                 Respiratory failure. Acute systolic heart failure.  Sonographer:    Clayton Lefort RDCS (AE) Referring Phys: [id number] [place]  1. The PMVL is restricted in systole (IIIB pathology). The regurgitation jet is central (A2-P2) and directed centrally/posteriorly. The mitral valve is degenerative. Severe mitral valve regurgitation. No evidence of mitral stenosis.  2. Left ventricular ejection fraction, by estimation, is 35 to 40%. The left ventricle has moderately decreased function. The left ventricle demonstrates global hypokinesis. There is mild concentric left ventricular hypertrophy. There is  the interventricular septum is flattened in systole and diastole, consistent with right ventricular pressure and volume overload.  3.  Right ventricular systolic function is moderately reduced. The right ventricular size is moderately enlarged. There is moderately elevated pulmonary artery systolic pressure. The estimated right ventricular systolic pressure is 34.1 mmHg.  4. Left atrial size was severely dilated.  5. Right atrial size was severely dilated.  6. Tricuspid valve regurgitation is moderate to severe.  7. The aortic valve is tricuspid. Aortic valve regurgitation is not visualized. Mild aortic valve stenosis.  8. The inferior vena cava is dilated in size with <50% respiratory variability, suggesting right atrial pressure of 15 mmHg. Comparison(s): No significant change from prior study. MR remains severe. Moderate to severe TR. RVSP improved. FINDINGS  Left Ventricle: Left ventricular ejection fraction, by estimation, is 35 to 40%. The left ventricle has moderately decreased function. The left ventricle demonstrates global hypokinesis. The left ventricular internal cavity size was normal in size. There is mild concentric left ventricular hypertrophy. The interventricular septum is flattened in systole and diastole, consistent with right ventricular pressure and volume overload. Right Ventricle: The right ventricular size is moderately enlarged. No increase in right ventricular wall thickness. Right ventricular systolic function is moderately reduced. There is moderately elevated pulmonary artery systolic pressure. The tricuspid  regurgitant velocity is 2.79 m/s, and with an assumed right atrial pressure of 15 mmHg, the estimated right ventricular systolic pressure is 93.7 mmHg. Left Atrium: Left atrial size was severely dilated. Right Atrium: Right atrial size was severely dilated. Pericardium: Trivial pericardial effusion is present. Mitral Valve: The PMVL is restricted in systole (IIIB pathology). The regurgitation jet is central (A2-P2) and directed centrally/posteriorly. The mitral valve is degenerative in appearance. There is mild  calcification of the anterior and posterior mitral valve leaflet(s). Severe mitral valve regurgitation. No evidence of mitral valve stenosis. Tricuspid Valve: The tricuspid valve is grossly normal. Tricuspid valve regurgitation is moderate to severe. Aortic Valve: The aortic valve is tricuspid. . There is moderate thickening and moderate calcification of the aortic valve. Aortic valve regurgitation is not visualized. Mild aortic stenosis is present. There is moderate thickening of the aortic valve. There is moderate calcification of the aortic valve. Pulmonic Valve: The pulmonic valve was grossly normal. Pulmonic valve regurgitation is trivial. No evidence of pulmonic stenosis. Venous: The inferior vena cava is dilated in size with less than 50% respiratory variability, suggesting right atrial pressure of 15 mmHg. Additional Comments: There is a small pleural effusion in the left lateral region. LEFT VENTRICLE PLAX 2D LVIDd:         4.60 cm LVIDs:         3.10 cm LV PW:         1.10 cm LV IVS:        1.40 cm LVOT diam:     2.00 cm LVOT Area:     3.14 cm  LV Volumes (MOD) LV vol d, MOD A2C: 62.7 ml LV vol d, MOD A4C: 83.7 ml LV vol s, MOD A2C: 33.8 ml LV vol s, MOD A4C: 45.1 ml LV SV MOD A2C:     28.9 ml LV SV MOD A4C:     83.7 ml LV SV MOD BP:      32.1 ml IVC IVC diam: 2.30 cm LEFT ATRIUM         Index LA diam:    4.00 cm 2.50 cm/m   AORTA Ao Root diam: 3.00 cm MR Peak grad:  88.4 mmHg   TRICUSPID VALVE MR Mean grad:    51.0 mmHg   TR Peak grad:   31.1 mmHg MR Vmax:         470.00 cm/s TR Vmax:        279.00 cm/s MR Vmean:        329.0 cm/s MR PISA:         4.02 cm    SHUNTS MR PISA Eff ROA: 34 mm      Systemic Diam: 2.00 cm MR PISA Radius:  0.80 cm Eleonore Chiquito MD Electronically signed by Eleonore Chiquito MD Signature Date/Time: 07/31/2020/11:26:10 AM    Final       Scheduled Meds: . acetaminophen  650 mg Oral Q4H  . amiodarone  400 mg Oral BID  . aspirin  81 mg Oral Daily  . atorvastatin  40 mg Oral  Daily  . Chlorhexidine Gluconate Cloth  6 each Topical Daily  . furosemide  60 mg Intravenous Once  . gabapentin  300 mg Oral BID  . insulin aspart  0-15 Units Subcutaneous Q4H  . mouth rinse  15 mL Mouth Rinse q12n4p  . metoprolol succinate  100 mg Oral Daily   Continuous Infusions: . sodium chloride Stopped (07/27/20 1737)  . heparin 450 Units/hr (07/31/20 0850)     LOS: 4 days      Time spent: 45 minutes   Dessa Phi, DO [facility] 07/31/2020, 11:36 AM   Available via Epic secure chat 7am-7pm After these hours, please refer to coverage provider listed on [email or web address]

## 2020-07-31 NOTE — PMR Pre-admission (Addendum)
PMR Admission Coordinator Pre-Admission Assessment  Patient: Shawna Curry is an 86 y.o., female MRN: [id number] DOB: [birth year] Height:   Weight: 52.7 kg              Insurance Information HMO: yes    PPO:      PCP:      IPA:      80/20:      OTHER:  PRIMARY: [employer]      Policy#: [id number]      Subscriber: pt CM Name: Shawna Curry      Phone#: [phone]     Fax#: [phone] Pre-Cert#: [id number] [place] for CIR provided by Shawna Curry with [employer] with updates due to fax listed above on 9/1.       Employer:  Benefits:  Phone #: [phone]     Name: n/a Eff. Date: 12/08/19     Deduct: $0      Out of Pocket Max: $3900 (met $3900)      Life Max: n/a  CIR: $295/day for days 1-6      SNF: 20 full days Outpatient:      Co-Pay: $10-$40/visit Home Health: 100%      Co-Pay:  DME: 80%     Co-Pay: 20% Providers:  SECONDARY:       Policy#:       Phone#:   Development worker, community:       Phone#:   The Therapist, art Information Summary" for patients in Inpatient Rehabilitation Facilities with attached "Privacy Act [place] Records" was provided and verbally reviewed with: Patient and Family  Emergency Contact Information Contact Information     Name Relation Home Work Mobile   Carbon Sperl Son [phone]  [phone]   Mcclanahan,Regina Daughter [phone]     Ratliff,Lakisha Granddaughter   [phone]      Current Medical History  Patient Admitting Diagnosis: R cerebral CVA and PEA arrest  History of Present Illness: Shawna Curry is a 86 y.o. right-handed female with history of atrial fibrillation, maintained on Eliquis and Plavix, hyperlipidemia, DVT of tibial vein, type 2 diabetes mellitus, left-sided PCA infarction, CAD/NSTEMI 02/2020, followed by Dr.Rohrbeck with [facility], history of right CEA left 80%, followed by vascular surgery, acute on chronic systolic congestive heart failure. Pt has had multiple hospitalizations this year followed by [facility] this spring, and short term SNF for rehab 2x this summer.  She presented on 07/25/2020 with increasing DOE. Patient noted to be hypotensive and became unresponsive in the ED requiring intubation.  PEA arrest with 5 minutes of CPR and given epinephrine and bicarb also with lactic acidosis.  EKG showed interval T waves in V5 V6 with 1 mm of elevation in V4 and placed on intravenous amiodarone and on IV heparin.  Admission chemistries potassium 5.2, BUN 51 creatinine 1.90, troponin 26-29, lactic acid 2.8, BNP 2010.  Initial echocardiogram with EF of 40-45%.  The left ventricle demonstrating global hypokinesis and plan to repeat echocardiogram 07/31/2020.  She was extubated 07/29/2020 and aspirin initiated 07/30/2020.  Close monitoring of renal function with creatinine elevated 2.31.  MRI of the brain completed 07/30/2020 after extubation and showed small acute posterior right cerebral infarction, advised to continue low-dose aspirin.  Patient is on a mechanical soft diet.  Therapy evaluations completed with recommendations of physical medicine rehab consult.    Past Medical History  Past Medical History:  Diagnosis Date   Atrial fibrillation [facility])    Coronary artery disease    Hypertension  Family History  family history includes Diabetes in her mother.  Prior Rehab/Hospitalizations:  Has the patient had prior rehab or hospitalizations prior to admission? Yes  Has the patient had major surgery during 100 days prior to admission? No  Current Medications   Current Facility-Administered Medications:    0.9 %  sodium chloride infusion, , Intravenous, PRN, Erick Colace, NP, Paused at 07/27/20 1737   acetaminophen (TYLENOL) tablet 650 mg, 650 mg, Oral, Q4H, Erick Colace, NP, 650 mg at 08/02/20 1015   amiodarone (PACERONE) tablet 200 mg, 200 mg, Oral, BID, Fay Records, MD, 200 mg at 08/02/20 1016   apixaban (ELIQUIS) tablet 2.5 mg, 2.5 mg, Oral, BID, Einar Grad, RPH, 2.5 mg  at 08/02/20 1016   atorvastatin (LIPITOR) tablet 40 mg, 40 mg, Oral, Daily, Julian Hy, DO, 40 mg at 08/02/20 1015   Chlorhexidine Gluconate Cloth 2 % PADS 6 each, 6 each, Topical, Daily, Erick Colace, NP, 6 each at 08/01/20 1033   docusate (COLACE) 50 MG/5ML liquid 100 mg, 100 mg, Oral, BID PRN, Erick Colace, NP   furosemide (LASIX) tablet 40 mg, 40 mg, Oral, Daily, Dorris Carnes V, MD, 40 mg at 08/02/20 1015   gabapentin (NEURONTIN) tablet 300 mg, 300 mg, Oral, BID, Erick Colace, NP, 300 mg at 08/02/20 1015   insulin aspart (novoLOG) injection 0-15 Units, 0-15 Units, Subcutaneous, TID WC, Dessa Phi, DO, 3 Units at 08/01/20 1651   lip balm (CARMEX) ointment, , Topical, PRN, Erick Colace, NP, 1 application at 74/82/70 1802   MEDLINE mouth rinse, 15 mL, Mouth Rinse, q12n4p, Erick Colace, NP, 15 mL at 08/01/20 1652   metoprolol succinate (TOPROL-XL) 24 hr tablet 25 mg, 25 mg, Oral, Daily, Dorris Carnes V, MD, 25 mg at 08/02/20 1015   potassium chloride (KLOR-CON) CR tablet 10 mEq, 10 mEq, Oral, Daily, Fay Records, MD, 10 mEq at 08/02/20 1016  Patients Current Diet:  Diet Order             DIET DYS 3 Room service appropriate? Yes; Fluid consistency: Thin  Diet effective now                   Precautions / Restrictions Precautions Precautions: Fall Precaution Comments: truncal and bil UE ataxia Restrictions Weight Bearing Restrictions: No   Has the patient had 2 or more falls or a fall with injury in the past year?No  Prior Activity Level Limited Community (1-2x/wk): modified independent with RW prior to admission, was cooking, cleaning, and completing her own ADLs with distant supervision (per daughter); pt does not drive.   Prior Functional Level Prior Function Level of Independence: Needs assistance Gait / Transfers Assistance Needed: walking with RW with supervision ADL's / Homemaking Assistance Needed: family assists with housework, able to  complete BADLs with supervision Comments: son and daughter alternate staying with pt  Self Care: Did the patient need help bathing, dressing, using the toilet or eating?  Independent  Indoor Mobility: Did the patient need assistance with walking from room to room (with or without device)? Independent  Stairs: Did the patient need assistance with internal or external stairs (with or without device)? Needed some help  Functional Cognition: Did the patient need help planning regular tasks such as shopping or remembering to take medications? Needed some help  Home Assistive Devices / [place] Devices/Equipment: Gilford Rile (specify type) Home Equipment: Toilet riser, Walker - 2 wheels, Cane - single point  Prior Device Use: Indicate devices/aids used by the patient prior to current illness, exacerbation or injury? Walker  Current Functional Level Cognition  Overall Cognitive Status: Impaired/Different from baseline Orientation Level: Oriented to person, Oriented to place, Disoriented to time, Disoriented to situation Following Commands: Follows one step commands inconsistently General Comments: pt with difficulty sequencing, follows simple commands     Extremity Assessment (includes Sensation/Coordination)  Upper Extremity Assessment: RUE deficits/detail, LUE deficits/detail RUE Deficits / Details: pt with noted weakness with automatic assist of LUE to move RUE. Noted ataxia with decreased spatial perception and frequent dropping of R UE during use RUE Coordination: decreased fine motor, decreased gross motor LUE Deficits / Details: better than R UE but difficulty with coordination and proprioception as well LUE Coordination: decreased fine motor, decreased gross motor  Lower Extremity Assessment: Defer to PT evaluation    ADLs  Overall ADL's : Needs assistance/impaired Eating/Feeding: Bed level, Moderate assistance Grooming: Minimal assistance, Sitting Grooming Details  (indicate cue type and reason): Able to wash face using L hand with supervision, but will require increased assistance for fine motor tasks during grooming Upper Body Bathing: Moderate assistance, Sitting Lower Body Bathing: Maximal assistance, Sit to/from stand Upper Body Dressing : Maximal assistance, Sitting Lower Body Dressing: Total assistance, Bed level Toileting- Clothing Manipulation and Hygiene: Total assistance, Bed level General ADL Comments: Pt with deficits in cognition, coordination, proprioception, strength, sitting/standing balance impacting ability to complete ADLs/transfers without extensive assist    Mobility  Overal bed mobility: Needs Assistance Bed Mobility: Supine to Sit, Sit to Supine Supine to sit: HOB elevated, Mod assist Sit to supine: Min assist General bed mobility comments: mod A for pt to come to EoB mainly for achieving flexion at the hips, pt with use of bedrails,     Transfers  Overall transfer level: Needs assistance Equipment used: Ambulation equipment used Transfer via Lift Equipment: Stedy Transfers: Sit to/from [employer] to Stand: Max assist, From elevated surface General transfer comment: did not attempt due to safety     Ambulation / Gait / Stairs / Emergency planning/management officer  Ambulation/Gait General Gait Details: unable    Posture / Balance Dynamic Sitting Balance Sitting balance - Comments: pt sat EoB for 8-10 minutes and was intermittently min guard to modA with no seeming progression Balance Overall balance assessment: Needs assistance Sitting-balance support: Feet supported, No upper extremity supported Sitting balance-Leahy Scale: Poor Sitting balance - Comments: pt sat EoB for 8-10 minutes and was intermittently min guard to modA with no seeming progression Postural control: Right lateral lean, Posterior lean Standing balance support: Bilateral upper extremity supported, During functional activity Standing balance-Leahy Scale:  Poor Standing balance comment: Mod A to Max A to maintain standing balance in [place] with 1 person assist    Special needs/care consideration Diabetic management yes and Designated visitor daughter, son, and daughter in law     Previous Home Environment (from acute therapy documentation) Living Arrangements: Alone Available Help at Discharge: Family, Available 24 hours/day Type of Home: House Home Layout: One level Home Access: Ramped entrance Bathroom Shower/Tub: Multimedia programmer: [place]: Yes Type of [place]: [place] (if known): [facility] Additional Comments: PLOF and setup obtain from PT info (conversation with son) and pt confirmed home setup, but conflicing PLOF information as pt confused  Discharge Living Setting Plans for Discharge Living Setting: Patient's home Type of Home at Discharge: House Discharge Home Layout: One level Discharge Home Access: Ramped  entrance (through the back) Discharge Bathroom Shower/Tub: Walk-in shower Discharge Bathroom Toilet: Standard Discharge Bathroom Accessibility: Yes How Accessible: Accessible via walker Does the patient have any problems obtaining your medications?: No  Social/Family/Support Systems Anticipated Caregiver: daughter Rollene Fare) and son Brita Romp) Anticipated Caregiver's Contact Information: Rollene Fare [phone] Ability/Limitations of Caregiver: min assist Caregiver Availability: 24/7 Discharge Plan Discussed with Primary Caregiver: Yes Is Caregiver In Agreement with Plan?: Yes Does Caregiver/Family have Issues with Lodging/Transportation while Pt is in Rehab?: No   Goals Patient/Family Goal for Rehab: PT/OT/SLP supervision to min assist Expected length of stay: 12-16 days. Pt/Family Agrees to Admission and willing to participate: Yes Program Orientation Provided & Reviewed with Pt/Caregiver Including Roles  & Responsibilities: Yes   Barriers to Discharge: Insurance for SNF coverage   Decrease burden of Care through IP rehab admission: n/a  Possible need for SNF placement upon discharge: Not anticipated.   Patient Condition: I have reviewed medical records from [facility], spoken with CM, and patient and daughter. I met with patient at the bedside for inpatient rehabilitation assessment.  Patient will benefit from ongoing PT, OT and SLP, can actively participate in 3 hours of therapy a day 5 days of the week, and can make measurable gains during the admission.  Patient will also benefit from the coordinated team approach during an Inpatient Acute Rehabilitation admission.  The patient will receive intensive therapy as well as Rehabilitation physician, nursing, social worker, and care management interventions.  Due to bladder management, bowel management, safety, disease management, medication administration and patient education the patient requires 24 hour a day rehabilitation nursing.  The patient is currently max +2 with mobility and basic ADLs.  Discharge setting and therapy post discharge at home with home health is anticipated.  Patient has agreed to participate in the Acute Inpatient Rehabilitation Program and will admit today.  Preadmission Screen Completed By:  Michel Santee, PT, DPT 08/02/2020 11:57 AM ______________________________________________________________________   Discussed status with Dr. Posey Pronto on 08/02/20 at 11:57 AM and received approval for admission today.  Admission Coordinator:  Michel Santee, PT, DPT time 11:57 AM Sudie Grumbling 08/02/20

## 2020-07-31 NOTE — Progress Notes (Signed)
Progress Note  Patient Name: Shawna Curry Date of Encounter: 07/31/2020  [facility] Cardiologist: (In [facility]  Dr Marijo File) Subjective   Pt appears comfortable in bed  No SOB   No pains   Inpatient Medications    Scheduled Meds: . acetaminophen  650 mg Oral Q4H  . amiodarone  400 mg Oral BID  . aspirin  81 mg Oral Daily  . atorvastatin  40 mg Oral Daily  . Chlorhexidine Gluconate Cloth  6 each Topical Daily  . gabapentin  300 mg Oral BID  . insulin aspart  0-15 Units Subcutaneous Q4H  . mouth rinse  15 mL Mouth Rinse q12n4p  . metoprolol succinate  100 mg Oral Daily   Continuous Infusions: . sodium chloride Stopped (07/27/20 1737)  . heparin 400 Units/hr (07/30/20 1919)   PRN Meds: sodium chloride, docusate, lip balm   Vital Signs    Vitals:   07/30/20 1532 07/30/20 1940 07/31/20 0028 07/31/20 0411  BP: 131/77 111/65 99/64 106/67  Pulse: 61 (!) 56 (!) 52 (!) 50  Resp: 19 18 18 16   Temp: (!) 97.3 F (36.3 C) 97.7 F (36.5 C) 97.7 F (36.5 C) 97.7 F (36.5 C)  TempSrc: Oral Oral Oral Oral  SpO2: 98% 100% 98% 94%  Weight:        Intake/Output Summary (Last 24 hours) at 07/31/2020 0749 Last data filed at 07/31/2020 0535 Gross per 24 hour  Intake 74.84 ml  Output 1025 ml  Net -950.16 ml   Last 3 Weights 07/30/2020 07/29/2020 07/28/2020  Weight (lbs) 112 lb 10.5 oz 118 lb 6.2 oz 114 lb 10.2 oz  Weight (kg) 51.1 kg 53.7 kg 52 kg      Telemetry    Remains in SB/SR  - Personally Reviewed  ECG     - Personally Reviewed  Physical Exam   GEN: No acute distress.   Neck: JVP is increased   Cardiac: RRR  Gr III/VI systolic murmur .  Respiratory: Clear to auscultation bilaterally. GI: Soft, nontender, non-distended  MS: No edema; No deformity. Neuro:  Nonfocal  Psych: Normal affect   Labs    High Sensitivity Troponin:   Recent Labs  Lab 07/25/20 1332 07/25/20 1523 07/27/20 1250 07/27/20 1548 07/28/20 0110  TROPONINIHS 29* 26* 26* 21* 61*       Chemistry Recent Labs  Lab 07/28/20 0110 07/28/20 0333 07/29/20 1351 07/30/20 0846 07/31/20 0627  NA 140   < > 142 141 140  K 3.8   < > 4.7 3.7 3.7  CL 104   < > 105 105 106  CO2 19*   < > 18* 21* 22  GLUCOSE 230*   < > 196* 116* 108*  BUN 45*   < > 61* 63* 57*  CREATININE 1.89*   < > 2.80* 2.31* 1.91*  CALCIUM 8.2*   < > 8.5* 8.6* 8.6*  PROT 5.4*  --   --  5.5* 5.1*  ALBUMIN 3.1*  --   --  3.3* 3.0*  AST 184*  --   --  107* 75*  ALT 120*  --   --  105* 85*  ALKPHOS 130*  --   --  125 135*  BILITOT 1.4*  --   --  1.2 0.7  GFRNONAA 24*   < > 15* 19* 23*  GFRAA 28*   < > 17* 22* 27*  ANIONGAP 17*   < > 19* 15 12   < > = values in  this interval not displayed.     Hematology Recent Labs  Lab 07/29/20 0236 07/29/20 1049 07/30/20 0424  WBC 24.5* 23.5* 18.6*  RBC 3.63* 3.37* 3.44*  HGB 10.2* 9.4* 9.8*  HCT 33.6* 29.9* 29.5*  MCV 92.6 88.7 85.8  MCH 28.1 27.9 28.5  MCHC 30.4 31.4 33.2  RDW 17.5* 17.0* 16.5*  PLT 209 198 190    BNP Recent Labs  Lab 07/29/20 1049 07/30/20 0424 07/31/20 0627  BNP >4,500.0* >4,500.0* 2,982.0*     DDimer  Recent Labs  Lab 07/27/20 1250  DDIMER 2.57*     Radiology    MR BRAIN WO CONTRAST  Result Date: 07/30/2020 CLINICAL DATA:  Cardiac arrest. History of atrial fibrillation and stroke. EXAM: MRI HEAD WITHOUT CONTRAST TECHNIQUE: Multiplanar, multiecho pulse sequences of the brain and surrounding structures were obtained without intravenous contrast. COMPARISON:  Head CT 07/28/2020 and MRI 02/19/2020 FINDINGS: The study could not be completed due to the patient's condition. Axial and coronal diffusion, axial T2* gradient echo, axial FLAIR, and coronal T2 sequences were obtained and are motion degraded including severe motion on the gradient echo sequence. Brain: There is a small acute cortical and subcortical infarct in the posterior right cerebral hemisphere near the junction of the temporal, parietal, and occipital lobes  (posterior MCA or border zone). No gross intracranial hemorrhage, mass/mass effect, or extra-axial fluid collection is identified. There is a chronic lacunar infarct at the lateral aspect of the left thalamus. T2 hyperintensities in the cerebral white matter bilaterally are similar to the prior MRI and are nonspecific but compatible with mild-to-moderate chronic small vessel ischemic disease. There is mild cerebral atrophy. A chronic lacunar infarct or cyst in the left subinsular region is unchanged. Mild cerebral atrophy is not considered abnormal for age. Vascular: Limited assessment on this motion degraded, incomplete examination. Skull and upper cervical spine: No gross destructive skull lesion. Sinuses/Orbits: No acute findings. Other: None. IMPRESSION: 1. Motion degraded, incomplete examination. 2. Small acute posterior right cerebral infarct. 3. Mild-to-moderate chronic small vessel ischemic disease. Electronically Signed   By: Logan Bores M.D.   On: 07/30/2020 15:16   US RENAL  Result Date: 07/29/2020 CLINICAL DATA:  Acute kidney injury. EXAM: RENAL / URINARY TRACT ULTRASOUND COMPLETE COMPARISON:  February 17, 2020. FINDINGS: Right Kidney: Renal measurements: 7.9 x 4.8 x 4.4 cm = volume: 87 mL. Increased echogenicity of renal parenchyma is noted. 1.1 cm simple cyst is noted in lower pole. No mass or hydronephrosis visualized. Left Kidney: Renal measurements: 9.3 x 4.4 x 4.2 cm = volume: 88 mL. Increased echogenicity of renal parenchyma is noted. No mass or hydronephrosis visualized. Bladder: Decompressed secondary to Foley catheter. Other: Minimal ascites is noted in the right upper quadrant. IMPRESSION: Increased echogenicity of renal parenchyma is noted bilaterally suggesting medical renal disease. Mild right renal atrophy is noted. No hydronephrosis or renal obstruction is noted. Electronically Signed   By: Marijo Conception M.D.   On: 07/29/2020 12:29   DG Chest Port 1 View  Result Date:  07/29/2020 CLINICAL DATA:  Hypoxia EXAM: PORTABLE CHEST 1 VIEW COMPARISON:  July 27, 2020 FINDINGS: Endotracheal tube tip is 3.3 cm above the carina. Nasogastric tube tip and side port are below the diaphragm. No pneumothorax. There is airspace consolidation in the left lower lobe with minimal left pleural effusion. There is mild atelectatic change in the left upper lobe. Lungs otherwise are clear. There is cardiomegaly with pulmonary vascularity normal. No adenopathy. There is aortic atherosclerosis. Bones are  osteoporotic with arthropathy in each shoulder. IMPRESSION: Tube positions as described without pneumothorax. Airspace opacity left lower lung region, likely representing pneumonia or aspiration. Minimal left pleural effusion. Atelectatic change left upper lobe. A small amount of pneumonia in the left upper lobe is possible. Stable cardiomegaly. Aortic Atherosclerosis (ICD10-I70.0). Electronically Signed   By: Lowella Grip III M.D.   On: 07/29/2020 11:07    Cardiac Studies   Echo to be done    Patient Profile     86 y.o. female with hx of CAD, CHF with PEA    Assessment & Plan    1 PEA arrest   Follow respiratory    2  CHF  Volume is up some.  I would give 1 dose lasix and follow  She is on toprol XL  No other agents  BP is labile  I would not push any other oral agents right now  3  Mitral regurgitation  Repeat echo today  (limited to reeval LV size, MR severity now that in SR)  4  Atrial fibrillation   She remains in SR on amiodarone   Had been persistent afib to this point.   I would continue as she will probably do better in SR given MR / CHF / Age  She is on 400 bid amiodarone to load  When goes home would use 200 bid    Then 200.  Again, goal prior had not been to keep on SR but she is in SR now and may help keep her out of hospital if maintains   Will need to follow HR and back down on b blocker as HR slows   5  Renal   Cr 0.95 4 yr ago   On admit was 1.64   Now declining  and at 1.9 with BUN 57    For questions or updates, please contact [facility] Please consult [email or web address] for contact info under        Signed, Dorris Carnes, MD  07/31/2020, 7:49 AM

## 2020-07-31 NOTE — Plan of Care (Signed)
  Problem: Clinical Measurements: Goal: Respiratory complications will improve Outcome: Progressing Goal: Cardiovascular complication will be avoided Outcome: Progressing   Problem: Coping: Goal: Level of anxiety will decrease Outcome: Progressing   Problem: Elimination: Goal: Will not experience complications related to bowel motility Outcome: Progressing

## 2020-07-31 NOTE — Progress Notes (Signed)
  Echocardiogram 2D Echocardiogram has been performed.  Shawna Curry 07/31/2020, 8:55 AM

## 2020-07-31 NOTE — Progress Notes (Signed)
Bedside nurse called to report patient has not voided since foley catheter removal. Bladder scan shows >400cc. Bedside Rn will in and out cath patient based on the sidebar for foley catheter removal.

## 2020-07-31 NOTE — Progress Notes (Signed)
Patient has not voided on this shift post foley removal. Bladder scan showed 412mL of urine. Patient states she feels like she has to urinate "only a little". [facility] called, patient in and out cathed per nursing sidebar for foley removal. Patient had 641ml output from straight cath. Will continue to monitor.   Elesa Hacker, RN

## 2020-08-01 ENCOUNTER — Inpatient Hospital Stay (HOSPITAL_COMMUNITY): Payer: Medicare HMO

## 2020-08-01 DIAGNOSIS — I639 Cerebral infarction, unspecified: Secondary | ICD-10-CM

## 2020-08-01 DIAGNOSIS — E785 Hyperlipidemia, unspecified: Secondary | ICD-10-CM

## 2020-08-01 DIAGNOSIS — E1165 Type 2 diabetes mellitus with hyperglycemia: Secondary | ICD-10-CM

## 2020-08-01 DIAGNOSIS — I5023 Acute on chronic systolic (congestive) heart failure: Secondary | ICD-10-CM

## 2020-08-01 DIAGNOSIS — I6330 Cerebral infarction due to thrombosis of unspecified cerebral artery: Secondary | ICD-10-CM | POA: Insufficient documentation

## 2020-08-01 DIAGNOSIS — I480 Paroxysmal atrial fibrillation: Secondary | ICD-10-CM

## 2020-08-01 DIAGNOSIS — I959 Hypotension, unspecified: Secondary | ICD-10-CM

## 2020-08-01 LAB — CBC
HCT: 28 % — ABNORMAL LOW (ref 36.0–46.0)
Hemoglobin: 9.1 g/dL — ABNORMAL LOW (ref 12.0–15.0)
MCH: 27.6 pg (ref 26.0–34.0)
MCHC: 32.5 g/dL (ref 30.0–36.0)
MCV: 84.8 fL (ref 80.0–100.0)
Platelets: 150 10*3/uL (ref 150–400)
RBC: 3.3 MIL/uL — ABNORMAL LOW (ref 3.87–5.11)
RDW: 16.7 % — ABNORMAL HIGH (ref 11.5–15.5)
WBC: 8.9 10*3/uL (ref 4.0–10.5)
nRBC: 0.6 % — ABNORMAL HIGH (ref 0.0–0.2)

## 2020-08-01 LAB — BASIC METABOLIC PANEL
Anion gap: 10 (ref 5–15)
BUN: 53 mg/dL — ABNORMAL HIGH (ref 8–23)
CO2: 25 mmol/L (ref 22–32)
Calcium: 8.6 mg/dL — ABNORMAL LOW (ref 8.9–10.3)
Chloride: 104 mmol/L (ref 98–111)
Creatinine, Ser: 1.75 mg/dL — ABNORMAL HIGH (ref 0.44–1.00)
GFR calc Af Amer: 30 mL/min — ABNORMAL LOW (ref >60)
GFR calc non Af Amer: 26 mL/min — ABNORMAL LOW (ref >60)
Glucose, Bld: 131 mg/dL — ABNORMAL HIGH (ref 70–99)
Potassium: 3.8 mmol/L (ref 3.5–5.1)
Sodium: 139 mmol/L (ref 135–145)

## 2020-08-01 LAB — CULTURE, BLOOD (ROUTINE X 2)
Culture: NO GROWTH
Culture: NO GROWTH
Special Requests: ADEQUATE

## 2020-08-01 LAB — GLUCOSE, CAPILLARY
Glucose-Capillary: 116 mg/dL — ABNORMAL HIGH (ref 70–99)
Glucose-Capillary: 106 mg/dL — ABNORMAL HIGH (ref 70–99)
Glucose-Capillary: 106 mg/dL — ABNORMAL HIGH (ref 70–99)
Glucose-Capillary: 164 mg/dL — ABNORMAL HIGH (ref 70–99)
Glucose-Capillary: 152 mg/dL — ABNORMAL HIGH (ref 70–99)

## 2020-08-01 LAB — BRAIN NATRIURETIC PEPTIDE: B Natriuretic Peptide: 2436 pg/mL — ABNORMAL HIGH (ref 0.0–100.0)

## 2020-08-01 LAB — APTT: aPTT: 61 seconds — ABNORMAL HIGH (ref 24–36)

## 2020-08-01 LAB — HEPARIN LEVEL (UNFRACTIONATED): Heparin Unfractionated: 0.57 IU/mL (ref 0.30–0.70)

## 2020-08-01 MED ORDER — AMIODARONE HCL 200 MG PO TABS
200.0000 mg | ORAL_TABLET | Freq: Two times a day (BID) | ORAL | Status: DC
  Administered 2020-08-01 – 2020-08-02 (×2): 200 mg via ORAL
  Filled 2020-08-01 (×2): qty 1

## 2020-08-01 MED ORDER — APIXABAN 2.5 MG PO TABS
2.5000 mg | ORAL_TABLET | Freq: Two times a day (BID) | ORAL | Status: DC
  Administered 2020-08-01 – 2020-08-02 (×3): 2.5 mg via ORAL
  Filled 2020-08-01 (×3): qty 1

## 2020-08-01 MED ORDER — METOPROLOL SUCCINATE ER 25 MG PO TB24
25.0000 mg | ORAL_TABLET | Freq: Every day | ORAL | Status: DC
  Administered 2020-08-02: 25 mg via ORAL
  Filled 2020-08-01: qty 1

## 2020-08-01 MED ORDER — POTASSIUM CHLORIDE CRYS ER 10 MEQ PO TBCR
10.0000 meq | EXTENDED_RELEASE_TABLET | Freq: Every day | ORAL | Status: DC
  Administered 2020-08-01 – 2020-08-02 (×2): 10 meq via ORAL
  Filled 2020-08-01 (×2): qty 1

## 2020-08-01 MED ORDER — FUROSEMIDE 10 MG/ML IJ SOLN
40.0000 mg | Freq: Once | INTRAMUSCULAR | Status: AC
  Administered 2020-08-01: 40 mg via INTRAVENOUS
  Filled 2020-08-01: qty 4

## 2020-08-01 MED ORDER — FUROSEMIDE 40 MG PO TABS
40.0000 mg | ORAL_TABLET | Freq: Every day | ORAL | Status: DC
  Administered 2020-08-02: 40 mg via ORAL
  Filled 2020-08-01: qty 1

## 2020-08-01 NOTE — Progress Notes (Addendum)
Inpatient Rehab Admissions Coordinator:   I have insurance authorization and a bed available for pt to admit to CIR today.  Paged Dr. Maylene Roes for confirmation that pt is ready and will update pt/family and TOC team aware.   Addendum: 4pm: Dr. Maylene Roes wanting to watch one more night for cardiac stability.  Will f/u with pt tomorrow for possible admission pending medical readiness.   Shann Medal, PT, DPT Admissions Coordinator [phone] 08/01/20  3:22 PM

## 2020-08-01 NOTE — H&P (Signed)
Physical Medicine and Rehabilitation Admission H&P    Chief Complaint  Patient presents with  . Shortness of Breath  . Foot Pain  : HPI: Shawna Curry is an 86 year old right-handed female with history of atrial fibrillation maintained on Eliquis as well as Plavix, hyperlipidemia, DVT of tibial vein, type 2 diabetes mellitus, PCA infarction with residual right-sided weakness, CAD/non-STEMI 02/2020 followed by Dr.Rohrbeck with [facility], history of right CEA, left carotid stenosis  followed by vascular surgery, acute on chronic systolic congestive heart failure.  History taken from chart review due to cognition.  Patient lives alone 1 level home with ramped entrance.  Ambulates with a rolling walker.  She has good family support with family alternating schedules to provide assistance.  Earlier this year patient went to the hospital on multiple occasions short stay in skilled nursing facility was receiving first outpatient therapy and then home health therapy.  She presented on 07/25/2020 with SOB.  Noted to be hypotensive became unresponsive in the ED requiring intubation/PEA arrest 5 minutes of CPR given epinephrine, bicarb also with given.  EKG showed interval T waves in V5 V6 with 1 mm of elevation in V4 and placed on intravenous amiodarone as well as IV heparin.  EEG negative for seizures, nonspecific generalized cerebral dysfunction encephalopathy no seizure activity.  Carotid Dopplers with 40 to 59% left ICA stenosis.  Admission chemistries potassium 5.2, BUN 51 creatinine 1.90 troponin 26-29, lactic acid 2.8, BNP 3419-3790.  Initial echocardiogram with ejection fraction of 40-45%.  The left ventricle demonstrating global hypokinesis and echocardiogram was repeated 07/31/2020 showing ejection fraction 35 to 40% mild concentric left ventricular hypertrophy moderately elevated pulmonary artery systolic pressure.Marland Kitchen  She was extubated 07/29/2020 aspirin initiated 07/30/2020.  Close  monitor renal function elevated 2.31 and improved with gentle hydration latest creatinine 1.75.  MRI of the brain completed 07/30/2020 after extubation showed a small acute posterior right cerebral infarction advised to  Resume home Eliquis per Neurology and initial aspirin was then discontinued.  Patient is on mechanical soft diet.  Therapy evaluations completed and patient was admitted for a comprehensive rehab program.  Please see preadmission assessment from earlier today as well.  Review of Systems  Unable to perform ROS: Mental acuity   Past Medical History:  Diagnosis Date  . Atrial fibrillation ([place])   . Coronary artery disease   . Hypertension    Past Surgical History:  Procedure Laterality Date  . ABDOMINAL HYSTERECTOMY    . HAND TENDON SURGERY    . JOINT REPLACEMENT     R hip x 2  . PERCUTANEOUS PLACEMENT INTRAVASCULAR STENT CERVICAL CAROTID ARTERY    . TONSILLECTOMY     Family History  Problem Relation Age of Onset  . Diabetes Mother    Social History:  reports that she has quit smoking. She has never used smokeless tobacco. She reports that she does not drink alcohol and does not use drugs. Allergies: No Known Allergies Medications Prior to Admission  Medication Sig Dispense Refill  . acetaminophen (TYLENOL) 650 MG CR tablet Take 1,300 mg by mouth daily as needed for pain.    Marland Kitchen alendronate (FOSAMAX) 35 MG tablet Take 35 mg by mouth every Monday.    Marland Kitchen apixaban (ELIQUIS) 2.5 MG TABS tablet Take 2.5 mg by mouth 2 (two) times daily.    . clopidogrel (PLAVIX) 75 MG tablet Take 75 mg by mouth daily.    Marland Kitchen diltiazem (CARDIZEM CD) 120 MG 24 hr capsule Take 120  mg by mouth daily.    Marland Kitchen ezetimibe (ZETIA) 10 MG tablet Take 10 mg by mouth at bedtime.     . folic acid (FOLVITE) 1 MG tablet Take 1 mg by mouth daily.    . furosemide (LASIX) 20 MG tablet Take 40 mg by mouth daily.    Marland Kitchen gabapentin (NEURONTIN) 100 MG capsule Take 200 mg by mouth 3 (three) times daily.    Marland Kitchen  l-methylfolate-B6-B12 (METANX) 3-35-2 MG TABS tablet Take 1 tablet by mouth 2 (two) times daily.    Marland Kitchen latanoprost (XALATAN) 0.005 % ophthalmic solution Place 1 drop into both eyes at bedtime.    . metFORMIN (GLUCOPHAGE-XR) 500 MG 24 hr tablet Take 500 mg by mouth 2 (two) times daily with a meal.    . [DISCONTINUED] atorvastatin (LIPITOR) 20 MG tablet Take 20 mg by mouth every evening.     . [DISCONTINUED] metoprolol succinate (TOPROL-XL) 100 MG 24 hr tablet Take 100 mg by mouth daily.      Drug Regimen Review Drug regimen was reviewed and remains appropriate with no significant issues identified  Home: Home Living Family/patient expects to be discharged to:: Private residence Living Arrangements: Alone Available Help at Discharge: Family, Available 24 hours/day Type of Home: House Home Access: Ramped entrance Home Layout: One level Bathroom Shower/Tub: Multimedia programmer: Standard Home Equipment: Geneticist, molecular, Environmental consultant - 2 wheels, Cane - single point Additional Comments: PLOF and setup obtain from PT info (conversation with son) and pt confirmed home setup, but conflicing PLOF information as pt confused   Functional History: Prior Function Level of Independence: Needs assistance Gait / Transfers Assistance Needed: walking with RW with supervision ADL's / Homemaking Assistance Needed: family assists with housework, able to complete BADLs with supervision Comments: son and daughter alternate staying with pt  Functional Status:  Mobility: Bed Mobility Overal bed mobility: Needs Assistance Bed Mobility: Supine to Sit, Sit to Supine Supine to sit: HOB elevated, Mod assist Sit to supine: Min assist General bed mobility comments: mod A for pt to come to EoB mainly for achieving flexion at the hips, pt with use of bedrails,  Transfers Overall transfer level: Needs assistance Equipment used: Ambulation equipment used Transfer via Lift Equipment: Stedy Transfers: Sit to/from  [employer] to Stand: Max assist, From elevated surface General transfer comment: did not attempt due to safety  Ambulation/Gait General Gait Details: unable    ADL: ADL Overall ADL's : Needs assistance/impaired Eating/Feeding: Bed level, Moderate assistance Grooming: Minimal assistance, Sitting Grooming Details (indicate cue type and reason): Able to wash face using L hand with supervision, but will require increased assistance for fine motor tasks during grooming Upper Body Bathing: Moderate assistance, Sitting Lower Body Bathing: Maximal assistance, Sit to/from stand Upper Body Dressing : Maximal assistance, Sitting Lower Body Dressing: Total assistance, Bed level Toileting- Clothing Manipulation and Hygiene: Total assistance, Bed level General ADL Comments: Pt with deficits in cognition, coordination, proprioception, strength, sitting/standing balance impacting ability to complete ADLs/transfers without extensive assist  Cognition: Cognition Overall Cognitive Status: Impaired/Different from baseline Orientation Level: Oriented to person, Oriented to place, Disoriented to time, Disoriented to situation Cognition Arousal/Alertness: Awake/alert Behavior During Therapy: Impulsive Overall Cognitive Status: Impaired/Different from baseline Area of Impairment: Memory, Following commands, Awareness, Problem solving, Orientation Orientation Level: Disoriented to, Situation, Place, Time Memory: Decreased short-term memory Following Commands: Follows one step commands inconsistently Awareness: Intellectual Problem Solving: Slow processing, Decreased initiation, Difficulty sequencing, Requires verbal cues, Requires tactile cues General Comments: pt with difficulty  sequencing, follows simple commands   Physical Exam: Blood pressure 117/61, pulse 60, temperature 97.9 F (36.6 C), temperature source Oral, resp. rate 16, weight 52.7 kg, SpO2 97 %. Physical Exam Vitals reviewed.    Constitutional:      Comments: Frail  HENT:     Head: Normocephalic and atraumatic.     Right Ear: External ear normal.     Left Ear: External ear normal.     Nose: Nose normal.  Eyes:     General:        Right eye: No discharge.        Left eye: No discharge.     Extraocular Movements: Extraocular movements intact.  Cardiovascular:     Rate and Rhythm: Normal rate and regular rhythm.  Pulmonary:     Effort: Pulmonary effort is normal. No respiratory distress.     Breath sounds: No stridor.  Abdominal:     General: Abdomen is flat. Bowel sounds are normal. There is no distension.  Musculoskeletal:     Cervical back: Normal range of motion and neck supple.     Comments: No edema or tenderness in extremities  Skin:    General: Skin is warm and dry.  Neurological:     Mental Status: She is alert.     Comments: Alert Oriented to name but not year.   She does have some difficulty naming objects with decrease in recall.   Follows simple commands. Motor: RUE: 3/5 proximal distally LUE: 4+/5 proximal distal Bilateral lower extremities: 4+/5 distal  Psychiatric:        Speech: Speech is tangential.        Cognition and Memory: Cognition is impaired. Memory is impaired.     Results for orders placed or performed during the hospital encounter of 07/27/20 (from the past 48 hour(s))  Glucose, capillary     Status: Abnormal   Collection Time: 07/31/20 12:57 PM  Result Value Ref Range   Glucose-Capillary 100 (H) 70 - 99 mg/dL    Comment: Glucose reference range applies only to samples taken after fasting for at least 8 hours.  Glucose, capillary     Status: Abnormal   Collection Time: 07/31/20  4:28 PM  Result Value Ref Range   Glucose-Capillary 188 (H) 70 - 99 mg/dL    Comment: Glucose reference range applies only to samples taken after fasting for at least 8 hours.  APTT     Status: Abnormal   Collection Time: 07/31/20  6:30 PM  Result Value Ref Range   aPTT 48 (H) 24 - 36  seconds    Comment:        IF BASELINE aPTT IS ELEVATED, SUGGEST PATIENT RISK ASSESSMENT BE USED TO DETERMINE APPROPRIATE ANTICOAGULANT THERAPY. Performed at [facility], [place] [address] [place], [place] [address]   Glucose, capillary     Status: Abnormal   Collection Time: 07/31/20  8:28 PM  Result Value Ref Range   Glucose-Capillary 122 (H) 70 - 99 mg/dL    Comment: Glucose reference range applies only to samples taken after fasting for at least 8 hours.  Glucose, capillary     Status: Abnormal   Collection Time: 08/01/20 12:54 AM  Result Value Ref Range   Glucose-Capillary 116 (H) 70 - 99 mg/dL    Comment: Glucose reference range applies only to samples taken after fasting for at least 8 hours.  Glucose, capillary     Status: Abnormal   Collection Time: 08/01/20  4:05  AM  Result Value Ref Range   Glucose-Capillary 106 (H) 70 - 99 mg/dL    Comment: Glucose reference range applies only to samples taken after fasting for at least 8 hours.  Brain natriuretic peptide     Status: Abnormal   Collection Time: 08/01/20  5:23 AM  Result Value Ref Range   B Natriuretic Peptide 2,436.0 (H) 0.0 - 100.0 pg/mL    Comment: Performed at [facility] [address], [place], [place] [address]  Basic metabolic panel     Status: Abnormal   Collection Time: 08/01/20  5:23 AM  Result Value Ref Range   Sodium 139 135 - 145 mmol/L   Potassium 3.8 3.5 - 5.1 mmol/L   Chloride 104 98 - 111 mmol/L   CO2 25 22 - 32 mmol/L   Glucose, Bld 131 (H) 70 - 99 mg/dL    Comment: Glucose reference range applies only to samples taken after fasting for at least 8 hours.   BUN 53 (H) 8 - 23 mg/dL   Creatinine, Ser 1.75 (H) 0.44 - 1.00 mg/dL   Calcium 8.6 (L) 8.9 - 10.3 mg/dL   GFR calc non Af Amer 26 (L) >60 mL/min   GFR calc Af Amer 30 (L) >60 mL/min   Anion gap 10 5 - 15    Comment: Performed at [facility] [address] [place], [place] [address]  Heparin level (unfractionated)      Status: None   Collection Time: 08/01/20  5:23 AM  Result Value Ref Range   Heparin Unfractionated 0.57 0.30 - 0.70 IU/mL    Comment: (NOTE) If heparin results are below expected values, and patient dosage has  been confirmed, suggest follow up testing of antithrombin III levels. Performed at [facility], [place] [address], [place], [place] [address]   APTT     Status: Abnormal   Collection Time: 08/01/20  5:23 AM  Result Value Ref Range   aPTT 61 (H) 24 - 36 seconds    Comment:        IF BASELINE aPTT IS ELEVATED, SUGGEST PATIENT RISK ASSESSMENT BE USED TO DETERMINE APPROPRIATE ANTICOAGULANT THERAPY. Performed at [facility], [place] [address], [place], [place] [address]   CBC     Status: Abnormal   Collection Time: 08/01/20  5:23 AM  Result Value Ref Range   WBC 8.9 4.0 - 10.5 K/uL   RBC 3.30 (L) 3.87 - 5.11 MIL/uL   Hemoglobin 9.1 (L) 12.0 - 15.0 g/dL   HCT 28.0 (L) 36 - 46 %   MCV 84.8 80.0 - 100.0 fL   MCH 27.6 26.0 - 34.0 pg   MCHC 32.5 30.0 - 36.0 g/dL   RDW 16.7 (H) 11.5 - 15.5 %   Platelets 150 150 - 400 K/uL   nRBC 0.6 (H) 0.0 - 0.2 %    Comment: Performed at [facility], [place] [address], [place], [place] [address]  Glucose, capillary     Status: Abnormal   Collection Time: 08/01/20  7:43 AM  Result Value Ref Range   Glucose-Capillary 106 (H) 70 - 99 mg/dL    Comment: Glucose reference range applies only to samples taken after fasting for at least 8 hours.  Glucose, capillary     Status: Abnormal   Collection Time: 08/01/20 11:29 AM  Result Value Ref Range   Glucose-Capillary 164 (H) 70 - 99 mg/dL    Comment: Glucose reference range applies only  to samples taken after fasting for at least 8 hours.  Glucose, capillary     Status: Abnormal   Collection Time: 08/01/20  4:27 PM  Result Value Ref Range   Glucose-Capillary 152 (H) 70 - 99 mg/dL    Comment: Glucose reference range applies only to samples taken after fasting for at least 8 hours.    Basic metabolic panel     Status: Abnormal   Collection Time: 08/02/20  4:10 AM  Result Value Ref Range   Sodium 142 135 - 145 mmol/L   Potassium 3.9 3.5 - 5.1 mmol/L   Chloride 106 98 - 111 mmol/L   CO2 25 22 - 32 mmol/L   Glucose, Bld 117 (H) 70 - 99 mg/dL    Comment: Glucose reference range applies only to samples taken after fasting for at least 8 hours.   BUN 48 (H) 8 - 23 mg/dL   Creatinine, Ser 1.44 (H) 0.44 - 1.00 mg/dL   Calcium 8.6 (L) 8.9 - 10.3 mg/dL   GFR calc non Af Amer 33 (L) >60 mL/min   GFR calc Af Amer 38 (L) >60 mL/min   Anion gap 11 5 - 15    Comment: Performed at [facility] [address], [place], [place] [address]  Hepatic function panel     Status: Abnormal   Collection Time: 08/02/20  4:10 AM  Result Value Ref Range   Total Protein 4.9 (L) 6.5 - 8.1 g/dL   Albumin 2.6 (L) 3.5 - 5.0 g/dL   AST 41 15 - 41 U/L   ALT 59 (H) 0 - 44 U/L   Alkaline Phosphatase 142 (H) 38 - 126 U/L   Total Bilirubin 0.5 0.3 - 1.2 mg/dL   Bilirubin, Direct 0.2 0.0 - 0.2 mg/dL   Indirect Bilirubin 0.3 0.3 - 0.9 mg/dL    Comment: Performed at [facility] [address] [place], [place] [address]  Lipid panel     Status: None   Collection Time: 08/02/20  4:10 AM  Result Value Ref Range   Cholesterol 114 0 - 200 mg/dL   Triglycerides 58 <150 mg/dL   HDL 41 >40 mg/dL   Total CHOL/HDL Ratio 2.8 RATIO   VLDL 12 0 - 40 mg/dL   LDL Cholesterol 61 0 - 99 mg/dL    Comment:        Total Cholesterol/HDL:CHD Risk Coronary Heart Disease Risk Table                     Men   Women  1/2 Average Risk   3.4   3.3  Average Risk       5.0   4.4  2 X Average Risk   9.6   7.1  3 X Average Risk  23.4   11.0        Use the calculated Patient Ratio above and the CHD Risk Table to determine the patient's CHD Risk.        ATP III CLASSIFICATION (LDL):  <100     mg/dL   Optimal  100-129  mg/dL   Near or Above                    Optimal  130-159  mg/dL   Borderline   160-189  mg/dL   High  >190     mg/dL   Very High Performed at [facility] [address] [place], [place] [address]  Glucose, capillary     Status: None   Collection Time: 08/02/20  8:03 AM  Result Value Ref Range   Glucose-Capillary 84 70 - 99 mg/dL    Comment: Glucose reference range applies only to samples taken after fasting for at least 8 hours.  Glucose, capillary     Status: Abnormal   Collection Time: 08/02/20 12:11 PM  Result Value Ref Range   Glucose-Capillary 118 (H) 70 - 99 mg/dL    Comment: Glucose reference range applies only to samples taken after fasting for at least 8 hours.   VAS US CAROTID  Result Date: 08/01/2020 Carotid Arterial Duplex Study Indications:       CVA. Comparison Study:  No prior study Performing Technologist: Maudry Mayhew MHA, RDMS, RVT, RDCS  Examination Guidelines: A complete evaluation includes B-mode imaging, spectral Doppler, color Doppler, and power Doppler as needed of all accessible portions of each vessel. Bilateral testing is considered an integral part of a complete examination. Limited examinations for reoccurring indications may be performed as noted.  Right Carotid Findings: +----------+--------+--------+--------+--------------------------+--------+           PSV cm/sEDV cm/sStenosisPlaque Description        Comments +----------+--------+--------+--------+--------------------------+--------+ CCA Prox  49      8                                                  +----------+--------+--------+--------+--------------------------+--------+ CCA Distal41      7               smooth and heterogenous            +----------+--------+--------+--------+--------------------------+--------+ ICA Prox  27      9               smooth and heterogenous            +----------+--------+--------+--------+--------------------------+--------+ ICA Distal68      15                                                  +----------+--------+--------+--------+--------------------------+--------+ ECA       69      6               heterogenous and irregular         +----------+--------+--------+--------+--------------------------+--------+ +----------+--------+-------+----------------+-------------------+           PSV cm/sEDV cmsDescribe        Arm Pressure (mmHG) +----------+--------+-------+----------------+-------------------+ [id number]             Multiphasic, WNL                    +----------+--------+-------+----------------+-------------------+ +---------+--------+--+--------+-+---------+ VertebralPSV cm/s31EDV cm/s7Antegrade +---------+--------+--+--------+-+---------+  Left Carotid Findings: +----------+-------+-------+--------+---------------------------------+--------+           PSV    EDV    StenosisPlaque Description               Comments           cm/s   cm/s                                                     +----------+-------+-------+--------+---------------------------------+--------+  CCA Prox  68     10                                                       +----------+-------+-------+--------+---------------------------------+--------+ CCA Distal63     13             smooth and heterogenous                   +----------+-------+-------+--------+---------------------------------+--------+ ICA Prox  177    51             heterogenous, irregular and                                               calcific                                  +----------+-------+-------+--------+---------------------------------+--------+ ICA Distal72     22                                                       +----------+-------+-------+--------+---------------------------------+--------+ ECA       67     10             calcific                                  +----------+-------+-------+--------+---------------------------------+--------+  +----------+--------+--------+----------------+-------------------+           PSV cm/sEDV cm/sDescribe        Arm Pressure (mmHG) +----------+--------+--------+----------------+-------------------+ [id number]             Multiphasic, WNL                    +----------+--------+--------+----------------+-------------------+ +---------+--------+--+--------+-+---------+ VertebralPSV cm/s48EDV cm/s7Antegrade +---------+--------+--+--------+-+---------+   Summary: Right Carotid: Velocities in the right ICA are consistent with a 1-39% stenosis. Left Carotid: Velocities in the left ICA are consistent with a 40-59% stenosis. Vertebrals:  Bilateral vertebral arteries demonstrate antegrade flow. Subclavians: Normal flow hemodynamics were seen in bilateral subclavian              arteries. *See table(s) above for measurements and observations.  Electronically signed by Monica Martinez MD on 08/01/2020 at 3:29:12 PM.    Final        Medical Problem List and Plan: 1.  Decreased functional mobility  secondary to right cerebral infarction near the junction of the temporal parietal and occipital lobes after PEA arrest as well as history of PCA infarction March 2021 with residual right hemiparesis  -patient may shower  -ELOS/Goals: 12-16 days/supervision/min a  Admit to CIR 2.  Antithrombotics: -DVT/anticoagulation: Intravenous heparin transitioned back to Eliquis  -antiplatelet therapy: N/A 3. Pain Management: Neurontin 300 mg twice daily 4. Mood: Provide emotional support  -antipsychotic agents: N/A 5. Neuropsych: This patient is not capable of making decisions on her own behalf. 6. Skin/Wound Care: Routine skin checks 7. Fluids/Electrolytes/Nutrition: Routine in and outs.  CMP ordered. 8.  Atrial fibrillation.  Amiodarone 200 mg twice daily, Toprol-XL 25 mg daily  Monitor with increased exertion. 9.  CAD/non-STEMI.  Follow-up cardiology services 10.  Type 2 diabetes mellitus.   Hemoglobin A1c 6.2.  Patient on Glucophage 500 mg twice daily prior to admission.  Resume as needed  Monitor with increased mobility. 11.  Acute on chronic systolic congestive heart failure.  Lasix 40 mg daily.  Monitor for any signs of fluid overload  Daily weights. 12.  History of CEA as well as left carotid stenosis.  Follow-up vascular surgery 13.  Hyperlipidemia: Lipitor 14.  AKI.    CMP ordered.  Lavon Paganini Angiulli, PA-C 08/02/2020  I have personally performed a face to face diagnostic evaluation, including, but not limited to relevant history and physical exam findings, of this patient and developed relevant assessment and plan.  Additionally, I have reviewed and concur with the physician assistant's documentation above.  Delice Lesch, MD, ABPMR

## 2020-08-01 NOTE — Progress Notes (Signed)
Pt had 5 runs of PVCs. Pt asymptomatic. Notified MD.

## 2020-08-01 NOTE — Progress Notes (Signed)
[facility] for Heparin Indication: chest pain/ACS, atrial fibrillation  No Known Allergies  Patient Measurements: Weight: 52.7 kg (116 lb 2.9 oz) Heparin Dosing Weight: 48 kg  Vital Signs: Temp: 98.5 F (36.9 C) (08/26 0744) Temp Source: Oral (08/26 0744) BP: 133/78 (08/26 0407) Pulse Rate: 58 (08/26 0744)  Labs: Recent Labs    07/29/20 1351 07/30/20 0424 07/30/20 0443 07/30/20 0846 07/30/20 1530 07/31/20 0627 07/31/20 1830 08/01/20 0523  HGB   < > 9.8*  --   --   --  9.9*  --  9.1*  HCT  --  29.5*  --   --   --  30.3*  --  28.0*  PLT  --  190  --   --   --  180  --  150  APTT  --  60*  --   --    < > 45* 48* 61*  HEPARINUNFRC  --   --  1.00*  --   --  0.65  --  0.57  CREATININE   < >  --   --  2.31*  --  1.91*  --  1.75*   < > = values in this interval not displayed.    Estimated Creatinine Clearance: 19.6 mL/min (A) (by C-G formula based on SCr of 1.75 mg/dL (H)).   Medical History: Past Medical History:  Diagnosis Date  . Atrial fibrillation ([place])   . Coronary artery disease   . Hypertension     Assessment: 86 year old woman with hx of CAD, hx of cardiac stent, atrial fibrillation, HFpEF, HLD, HTN, PVD presented with SOB and subsequently suffered PEA arrest; received intubation and CPR in ED. Noted history of afib, anticoagulation note from 07/23/20 indicates that she was on apixaban prior to admit.  Pharmacy now to transition back to home apixaban. Given Wt and age pt qualifies for reduced home dose of 2.5mg  BID. AKI resolving.  Goal of Therapy:  Heparin level 0.3-0.7 units/ml aPTT 66-102 seconds Monitor platelets by anticoagulation protocol: Yes   Plan:  -Stop heparin -Resume apixaban 2.5mg  BID -Pharmacy wil sign off, reconsult as needed   Arrie Senate, PharmD, BCPS Clinical Pharmacist [phone] Please check AMION for all [facility] numbers 08/01/2020

## 2020-08-01 NOTE — Progress Notes (Signed)
Physical Therapy Treatment Patient Details Name: Shawna Curry MRN: [id number] DOB: Dec 24, 1932 Today's Date: 08/01/2020    History of Present Illness 86 yo admitted 8/21 with SOB with respiratory failure leading to cardiac arrest post intubation. Extubated 8/23. PMhx: HFpEF, Afib, CAD    PT Comments    Pt agreeable to sit on EoB with therapy. Pt able to get LE out of bed but then requires modA and maximal cuing for bending at hips to come to upright. Once seated pt vacillates between min guard and mod A for balance with increased posterior lean. Able to complete 10x LAQ on bilateral LE, but when NT enters room unable to return to therapeutic exercise. Dinner came and pt focus of attention shifted. Pt requires min A for returning to bed. Set up for dinner. D/c plan remains appropriate at this time. PT will continue to follow acutely.      Follow Up Recommendations  CIR;Supervision/Assistance - 24 hour     Equipment Recommendations  Other (comment) (TBD)    Recommendations for Other Services OT consult;Rehab consult     Precautions / Restrictions Precautions Precautions: Fall Precaution Comments: truncal and bil UE ataxia Restrictions Weight Bearing Restrictions: No    Mobility  Bed Mobility Overal bed mobility: Needs Assistance Bed Mobility: Supine to Sit;Sit to Supine     Supine to sit: HOB elevated;Mod assist Sit to supine: Min assist   General bed mobility comments: mod A for pt to come to EoB mainly for achieving flexion at the hips, pt with use of bedrails,   Transfers                 General transfer comment: did not attempt due to safety      Modified Rankin (Stroke Patients Only) Modified Rankin (Stroke Patients Only) Pre-Morbid Rankin Score: Moderately severe disability Modified Rankin: Severe disability     Balance Overall balance assessment: Needs assistance Sitting-balance support: Feet supported;No upper extremity supported Sitting  balance-Leahy Scale: Poor Sitting balance - Comments: pt sat EoB for 8-10 minutes and was intermittently min guard to modA with no seeming progression                                    Cognition Arousal/Alertness: Awake/alert Behavior During Therapy: Impulsive Overall Cognitive Status: Impaired/Different from baseline Area of Impairment: Memory;Following commands;Awareness;Problem solving;Orientation                 Orientation Level: Disoriented to;Situation;Place;Time   Memory: Decreased short-term memory Following Commands: Follows one step commands inconsistently   Awareness: Intellectual Problem Solving: Slow processing;Decreased initiation;Difficulty sequencing;Requires verbal cues;Requires tactile cues General Comments: pt with difficulty sequencing, follows simple commands       Exercises General Exercises - Lower Extremity Long Arc Quad: AROM;Both;10 reps;Seated    General Comments General comments (skin integrity, edema, etc.): VSS on RA       Pertinent Vitals/Pain Pain Assessment: No/denies pain           PT Goals (current goals can now be found in the care plan section) Acute Rehab PT Goals Patient Stated Goal: be able to walk PT Goal Formulation: With patient/family Time For Goal Achievement: 08/13/20 Potential to Achieve Goals: Fair    Frequency    Min 3X/week      PT Plan Current plan remains appropriate       AM-PAC PT "6 Clicks" Mobility   Outcome Measure  Help  needed turning from your back to your side while in a flat bed without using bedrails?: A Little Help needed moving from lying on your back to sitting on the side of a flat bed without using bedrails?: A Little Help needed moving to and from a bed to a chair (including a wheelchair)?: Total Help needed standing up from a chair using your arms (e.g., wheelchair or bedside chair)?: Total Help needed to walk in hospital room?: Total Help needed climbing 3-5 steps  with a railing? : Total 6 Click Score: 10    End of Session Equipment Utilized During Treatment: Gait belt Activity Tolerance: Patient tolerated treatment well Patient left: in bed;with call bell/phone within reach;with bed alarm set;with nursing/sitter in room Nurse Communication: Mobility status;Need for lift equipment;Precautions PT Visit Diagnosis: Other abnormalities of gait and mobility (R26.89);Difficulty in walking, not elsewhere classified (R26.2);Muscle weakness (generalized) (M62.81);Other symptoms and signs involving the nervous system (W26.378)     Time: 5885-0277 PT Time Calculation (min) (ACUTE ONLY): 16 min  Charges:  $Therapeutic Exercise: 8-22 mins                     Nga Rabon B. Migdalia Dk PT, DPT Acute Rehabilitation Services Pager [phone] Office [phone]    [place]ren 08/01/2020, 4:48 PM

## 2020-08-01 NOTE — Progress Notes (Signed)
Carotid artery duplex completed. Refer to "CV Proc" under chart review to view preliminary results.  08/01/2020 3:11 PM Kelby Aline., MHA, RVT, RDCS, RDMS

## 2020-08-01 NOTE — Progress Notes (Signed)
STROKE TEAM PROGRESS NOTE   INTERVAL HISTORY Daughter and RN are at bedside. Pt is awake alert and interactive. She is orientated to place and people but not time. She still has chronic right UE weakness, not left sided symptoms. Her heparin IV now transitioned to eliquis.   Vitals:   07/31/20 2100 08/01/20 0020 08/01/20 0407 08/01/20 0744  BP: 117/68 118/74 133/78   Pulse: (!) 53 (!) 54 (!) 56 (!) 58  Resp:  18 18 18   Temp:  97.8 F (36.6 C) 98.3 F (36.8 C) 98.5 F (36.9 C)  TempSrc:  Oral Oral Oral  SpO2: 97% 96% 95% 98%  Weight:   52.7 kg    CBC:  Recent Labs  Lab 07/27/20 1250 07/27/20 1811 07/31/20 0627 08/01/20 0523  WBC 6.1   < > 11.3* 8.9  NEUTROABS 4.8  --   --   --   HGB 10.3*   < > 9.9* 9.1*  HCT 32.8*   < > 30.3* 28.0*  MCV 89.1   < > 86.3 84.8  PLT 221   < > 180 150   < > = values in this interval not displayed.   Basic Metabolic Panel:  Recent Labs  Lab 07/29/20 0236 07/29/20 1351 07/29/20 2215 07/30/20 0846 07/31/20 0627 08/01/20 0523  NA  --    < >  --    < > 140 139  K  --    < >  --    < > 3.7 3.8  CL  --    < >  --    < > 106 104  CO2  --    < >  --    < > 22 25  GLUCOSE  --    < >  --    < > 108* 131*  BUN  --    < >  --    < > 57* 53*  CREATININE  --    < >  --    < > 1.91* 1.75*  CALCIUM  --    < >  --    < > 8.6* 8.6*  MG 2.8*  --  2.3  --   --   --   PHOS 7.2*  --  3.9  --   --   --    < > = values in this interval not displayed.   Lipid Panel: No results for input(s): CHOL, TRIG, HDL, CHOLHDL, VLDL, LDLCALC in the last 168 hours. HgbA1c:  Recent Labs  Lab 07/28/20 0111  HGBA1C 6.2*   Urine Drug Screen: No results for input(s): LABOPIA, COCAINSCRNUR, LABBENZ, AMPHETMU, THCU, LABBARB in the last 168 hours.  Alcohol Level No results for input(s): ETH in the last 168 hours.  IMAGING past 24 hours No results found.  PHYSICAL EXAM  Temp:  [97.7 F (36.5 C)-98.5 F (36.9 C)] 98.5 F (36.9 C) (08/26 0744) Pulse Rate:  [51-61]  61 (08/26 0900) Resp:  [18] 18 (08/26 0744) BP: (107-137)/(61-82) 122/61 (08/26 1000) SpO2:  [93 %-100 %] 93 % (08/26 0900) Weight:  [52.7 kg] 52.7 kg (08/26 0407)  General - Well nourished, well developed, in no apparent distress.  Ophthalmologic - fundi not visualized due to noncooperation.  Cardiovascular - irregularly irregular heart rate and rhythm.  Neuro - awake alert pleasant orientated to people and place, not to time. Fluent speech, no aphasia, still mild paucity of speech, mild dysarthria. Able to name and repeat. Visual field full, counting fingers  bilaterally. No gaze preference, mild right nasolabial fold flattening. Moving BLEs and LUE normally, however, right UE proximal 3-/5 with drift to bed within 5 sec. Bicep and tricep and finger grip 4/5. LUE FTN intact grossly. Sensation subjectively symmetrical. Gait not tested.   ASSESSMENT/PLAN Shawna Curry is a 86 y.o. female with history of CAD, atrial fibrillation (on Eliquis), left PCA CVA (02/2020), HLD, HTN and PAD who presented initially on 8/21 for SOB. Had witnessed PEA arrest. Resuscitated. Physical movement limited. MRI was then obtained which found acute small posterior right cerebral hemisphere ischemic infarction near the junction of the temporal, parietal, and occipital lobes (posterior MCA or border zone).   Neurology consulted 8/25 for MRI finding.  Stroke:   Small R posterior MCA infarct embolic likely secondary to PEA arrest   CT head 8/22 No acute abnormality. Small vessel disease.   MRI 8/24  Small posterior R cerebral infarct. Mild to moderate small vessel disease.   Carotid Doppler  pending  2D Echo EF 35-40%. No source of embolus. Severe MR. LA severely dilated.   LDL pending    HgbA1c 6.2  VTE prophylaxis - Eliquis  clopidogrel 75 mg daily and Eliquis (apixaban) daily prior to admission, now on Eliquis (apixaban) daily. Continue at d/c.   Therapy recommendations:  CIR  Disposition:  pending  - may have CIR bed later today or tomorrow  Carotid Stenosis   Hx of right CEA  Recent CTA neck in 02/2020 at [facility] severe left ICA stenosis at least 80% and right ICA widely patent  CUS pending  Left ICA stenosis seems to be asymptomatic this time  On eliquis  Hx stroke/TIA  02/2020 admitted to [facility] for SOB, LE edema, right-sided weakness.  Found to have L PCA territory infarcts due to left P2 occlusion.  CTA head and neck severe left ICA stenosis at least 80%, right ACA patent.  Also found to have AF in RVR, and right LE DVT. Placed on Eliquis.  Discharged with Eliquis 2.5 twice daily, Plavix 75, Lipitor 20 and Zetia.  On follow-up with neurology at [facility], found patient not on Eliquis since 04/2020, resumed Eliquis 2.5 twice daily.  Baseline walk at home with walker  Coronary artery disease  MI x2 in 02/24/2020 and 04/25/2020   Patient cardiologist within a week for the system recommend Eliquis 2.5+ either aspirin 81 or Plavix 75.  On Eliquis 2.5 and Plavix 75 PTA  s/p PEA arrest this admission s/p resuscitation including intubation    Cardiology on board, heparin IV switch to Eliquis.  Aspirin discontinued.  Atrial Fibrillation  AF with RVR found in 02/2020 at [facility]  Home anticoagulation:  Eliquis (apixaban) daily  . Now on eliquis, Continue at discharge . On amiodarone and metoprolol   Hypertension  Stable . Long-term BP goal normotensive  Hyperlipidemia  Home meds:  lipitor 20 + zetia 10  Now on lipitior 40   LDL pending, goal < 70  transaminitis likely d/t cardiac arrest, LFTs improving 184/120->107/105->75/85-> pending   Continue statin at discharge  Other Stroke Risk Factors  Advanced age  Former Cigarette smoker  Acute on chronic Congestive heart failure, EF 35-40%  PAD  Other Active Problems  Acute hypioxemic respiraotyr failure, resolved on RA  AKI  Leukocytosis, WBC 24.5->18.6->11.3->8.9, resolved  Elevated TSH 5.642, for  OP follow up  Hospital day # 5   Rosalin Hawking, MD PhD Stroke Neurology 08/01/2020 3:04 PM    To contact Stroke Continuity provider, please  refer to [email or web address] After hours, contact General Neurology

## 2020-08-01 NOTE — Progress Notes (Signed)
Progress Note  Patient Name: Shawna Curry Date of Encounter: 08/01/2020  [facility] Cardiologist: (In [facility]  Dr Marijo File) Subjective   Pateint eating breakfast   Appears a little discheveled.   Says she lives at home with her mother   Inpatient Medications    Scheduled Meds: . acetaminophen  650 mg Oral Q4H  . amiodarone  400 mg Oral BID  . aspirin  81 mg Oral Daily  . atorvastatin  40 mg Oral Daily  . Chlorhexidine Gluconate Cloth  6 each Topical Daily  . gabapentin  300 mg Oral BID  . insulin aspart  0-15 Units Subcutaneous TID WC  . mouth rinse  15 mL Mouth Rinse q12n4p  . metoprolol succinate  100 mg Oral Daily   Continuous Infusions: . sodium chloride Stopped (07/27/20 1737)  . heparin 650 Units/hr (08/01/20 0652)   PRN Meds: sodium chloride, docusate, lip balm   Vital Signs    Vitals:   07/31/20 2030 07/31/20 2100 08/01/20 0020 08/01/20 0407  BP: 119/68 117/68 118/74 133/78  Pulse: (!) 52 (!) 53 (!) 54 (!) 56  Resp: 18  18 18   Temp: 97.7 F (36.5 C)  97.8 F (36.6 C) 98.3 F (36.8 C)  TempSrc: Oral  Oral Oral  SpO2: 100% 97% 96% 95%  Weight:    52.7 kg    Intake/Output Summary (Last 24 hours) at 08/01/2020 0700 Last data filed at 08/01/2020 4656 Gross per 24 hour  Intake 452.2 ml  Output 200 ml  Net 252.2 ml   Last 3 Weights 08/01/2020 07/30/2020 07/29/2020  Weight (lbs) 116 lb 2.9 oz 112 lb 10.5 oz 118 lb 6.2 oz  Weight (kg) 52.7 kg 51.1 kg 53.7 kg      Telemetry    Remains in SB/SR  - Personally Reviewed  ECG     - Personally Reviewed  Physical Exam   GEN: No acute distress.   Neck: JVP is increased   Cardiac: RRR  Gr III/VI systolic murmur at apex   Respiratory: Clear to auscultation bilaterally. GI: Soft, nontender, non-distended  MS: No edema; No deformity. Neuro:  Nonfocal  Psych: Normal affect   Labs    High Sensitivity Troponin:   Recent Labs  Lab 07/25/20 1332 07/25/20 1523 07/27/20 1250 07/27/20 1548  07/28/20 0110  TROPONINIHS 29* 26* 26* 21* 61*      Chemistry Recent Labs  Lab 07/28/20 0110 07/28/20 0333 07/30/20 0846 07/31/20 0627 08/01/20 0523  NA 140   < > 141 140 139  K 3.8   < > 3.7 3.7 3.8  CL 104   < > 105 106 104  CO2 19*   < > 21* 22 25  GLUCOSE 230*   < > 116* 108* 131*  BUN 45*   < > 63* 57* 53*  CREATININE 1.89*   < > 2.31* 1.91* 1.75*  CALCIUM 8.2*   < > 8.6* 8.6* 8.6*  PROT 5.4*  --  5.5* 5.1*  --   ALBUMIN 3.1*  --  3.3* 3.0*  --   AST 184*  --  107* 75*  --   ALT 120*  --  105* 85*  --   ALKPHOS 130*  --  125 135*  --   BILITOT 1.4*  --  1.2 0.7  --   GFRNONAA 24*   < > 19* 23* 26*  GFRAA 28*   < > 22* 27* 30*  ANIONGAP 17*   < > 15 12  10   < > = values in this interval not displayed.     Hematology Recent Labs  Lab 07/30/20 0424 07/31/20 0627 08/01/20 0523  WBC 18.6* 11.3* 8.9  RBC 3.44* 3.51* 3.30*  HGB 9.8* 9.9* 9.1*  HCT 29.5* 30.3* 28.0*  MCV 85.8 86.3 84.8  MCH 28.5 28.2 27.6  MCHC 33.2 32.7 32.5  RDW 16.5* 17.2* 16.7*  PLT 190 180 150    BNP Recent Labs  Lab 07/30/20 0424 07/31/20 0627 08/01/20 0523  BNP >4,500.0* 2,982.0* 2,436.0*     DDimer  Recent Labs  Lab 07/27/20 1250  DDIMER 2.57*     Radiology    MR BRAIN WO CONTRAST  Result Date: 07/30/2020 CLINICAL DATA:  Cardiac arrest. History of atrial fibrillation and stroke. EXAM: MRI HEAD WITHOUT CONTRAST TECHNIQUE: Multiplanar, multiecho pulse sequences of the brain and surrounding structures were obtained without intravenous contrast. COMPARISON:  Head CT 07/28/2020 and MRI 02/19/2020 FINDINGS: The study could not be completed due to the patient's condition. Axial and coronal diffusion, axial T2* gradient echo, axial FLAIR, and coronal T2 sequences were obtained and are motion degraded including severe motion on the gradient echo sequence. Brain: There is a small acute cortical and subcortical infarct in the posterior right cerebral hemisphere near the junction of the  temporal, parietal, and occipital lobes (posterior MCA or border zone). No gross intracranial hemorrhage, mass/mass effect, or extra-axial fluid collection is identified. There is a chronic lacunar infarct at the lateral aspect of the left thalamus. T2 hyperintensities in the cerebral white matter bilaterally are similar to the prior MRI and are nonspecific but compatible with mild-to-moderate chronic small vessel ischemic disease. There is mild cerebral atrophy. A chronic lacunar infarct or cyst in the left subinsular region is unchanged. Mild cerebral atrophy is not considered abnormal for age. Vascular: Limited assessment on this motion degraded, incomplete examination. Skull and upper cervical spine: No gross destructive skull lesion. Sinuses/Orbits: No acute findings. Other: None. IMPRESSION: 1. Motion degraded, incomplete examination. 2. Small acute posterior right cerebral infarct. 3. Mild-to-moderate chronic small vessel ischemic disease. Electronically Signed   By: Logan Bores M.D.   On: 07/30/2020 15:16   DG Chest Port 1 View  Result Date: 07/31/2020 CLINICAL DATA:  Respiratory failure. EXAM: PORTABLE CHEST 1 VIEW COMPARISON:  July 29, 2020. FINDINGS: Stable cardiomegaly. No pneumothorax is noted. Endotracheal and nasogastric tubes have been removed. Mild bibasilar subsegmental atelectasis is noted. Small right pleural effusion is noted. Degenerative changes are seen involving both glenohumeral joints. IMPRESSION: Mild bibasilar subsegmental atelectasis. Small right pleural effusion. Endotracheal and nasogastric tubes have been removed. Aortic Atherosclerosis (ICD10-I70.0). Electronically Signed   By: Marijo Conception M.D.   On: 07/31/2020 08:27   ECHOCARDIOGRAM LIMITED  Result Date: 07/31/2020    ECHOCARDIOGRAM LIMITED REPORT   Patient Name:   Shawna Curry Date of Exam: 07/31/2020 Medical Rec #:  [id number]     Height:       68.0 in Accession #:    [id number]    Weight:       112.7 lb Date of  Birth:  04/05/1933     BSA:          1.601 m Patient Age:    86 years      BP:           106/67 mmHg Patient Gender: F             HR:  50 bpm. Exam Location:  Inpatient Procedure: Limited Echo, Cardiac Doppler and Color Doppler Indications:    Mitral valve insufficiency  History:        Patient has prior history of Echocardiogram examinations, most                 recent 07/28/2020. Risk Factors:Former Smoker. Sepsis.                 Respiratory failure. Acute systolic heart failure.  Sonographer:    Clayton Lefort RDCS (AE) Referring Phys: [id number] [place]  1. The PMVL is restricted in systole (IIIB pathology). The regurgitation jet is central (A2-P2) and directed centrally/posteriorly. The mitral valve is degenerative. Severe mitral valve regurgitation. No evidence of mitral stenosis.  2. Left ventricular ejection fraction, by estimation, is 35 to 40%. The left ventricle has moderately decreased function. The left ventricle demonstrates global hypokinesis. There is mild concentric left ventricular hypertrophy. There is the interventricular septum is flattened in systole and diastole, consistent with right ventricular pressure and volume overload.  3. Right ventricular systolic function is moderately reduced. The right ventricular size is moderately enlarged. There is moderately elevated pulmonary artery systolic pressure. The estimated right ventricular systolic pressure is 67.8 mmHg.  4. Left atrial size was severely dilated.  5. Right atrial size was severely dilated.  6. Tricuspid valve regurgitation is moderate to severe.  7. The aortic valve is tricuspid. Aortic valve regurgitation is not visualized. Mild aortic valve stenosis.  8. The inferior vena cava is dilated in size with <50% respiratory variability, suggesting right atrial pressure of 15 mmHg. Comparison(s): No significant change from prior study. MR remains severe. Moderate to severe TR. RVSP improved. FINDINGS  Left Ventricle:  Left ventricular ejection fraction, by estimation, is 35 to 40%. The left ventricle has moderately decreased function. The left ventricle demonstrates global hypokinesis. The left ventricular internal cavity size was normal in size. There is mild concentric left ventricular hypertrophy. The interventricular septum is flattened in systole and diastole, consistent with right ventricular pressure and volume overload. Right Ventricle: The right ventricular size is moderately enlarged. No increase in right ventricular wall thickness. Right ventricular systolic function is moderately reduced. There is moderately elevated pulmonary artery systolic pressure. The tricuspid  regurgitant velocity is 2.79 m/s, and with an assumed right atrial pressure of 15 mmHg, the estimated right ventricular systolic pressure is 93.8 mmHg. Left Atrium: Left atrial size was severely dilated. Right Atrium: Right atrial size was severely dilated. Pericardium: Trivial pericardial effusion is present. Mitral Valve: The PMVL is restricted in systole (IIIB pathology). The regurgitation jet is central (A2-P2) and directed centrally/posteriorly. The mitral valve is degenerative in appearance. There is mild calcification of the anterior and posterior mitral valve leaflet(s). Severe mitral valve regurgitation. No evidence of mitral valve stenosis. Tricuspid Valve: The tricuspid valve is grossly normal. Tricuspid valve regurgitation is moderate to severe. Aortic Valve: The aortic valve is tricuspid. . There is moderate thickening and moderate calcification of the aortic valve. Aortic valve regurgitation is not visualized. Mild aortic stenosis is present. There is moderate thickening of the aortic valve. There is moderate calcification of the aortic valve. Pulmonic Valve: The pulmonic valve was grossly normal. Pulmonic valve regurgitation is trivial. No evidence of pulmonic stenosis. Venous: The inferior vena cava is dilated in size with less than 50%  respiratory variability, suggesting right atrial pressure of 15 mmHg. Additional Comments: There is a small pleural effusion in the left lateral region. LEFT  VENTRICLE PLAX 2D LVIDd:         4.60 cm LVIDs:         3.10 cm LV PW:         1.10 cm LV IVS:        1.40 cm LVOT diam:     2.00 cm LVOT Area:     3.14 cm  LV Volumes (MOD) LV vol d, MOD A2C: 62.7 ml LV vol d, MOD A4C: 83.7 ml LV vol s, MOD A2C: 33.8 ml LV vol s, MOD A4C: 45.1 ml LV SV MOD A2C:     28.9 ml LV SV MOD A4C:     83.7 ml LV SV MOD BP:      32.1 ml IVC IVC diam: 2.30 cm LEFT ATRIUM         Index LA diam:    4.00 cm 2.50 cm/m   AORTA Ao Root diam: 3.00 cm MR Peak grad:    88.4 mmHg   TRICUSPID VALVE MR Mean grad:    51.0 mmHg   TR Peak grad:   31.1 mmHg MR Vmax:         470.00 cm/s TR Vmax:        279.00 cm/s MR Vmean:        329.0 cm/s MR PISA:         4.02 cm    SHUNTS MR PISA Eff ROA: 34 mm      Systemic Diam: 2.00 cm MR PISA Radius:  0.80 cm Eleonore Chiquito MD Electronically signed by Eleonore Chiquito MD Signature Date/Time: 07/31/2020/11:26:10 AM    Final     Cardiac Studies   1. The PMVL is restricted in systole (IIIB pathology). The regurgitation jet is central (A2-P2) and directed centrally/posteriorly. The mitral valve is degenerative. Severe mitral valve regurgitation. No evidence of mitral stenosis. 2. Left ventricular ejection fraction, by estimation, is 35 to 40%. The left ventricle has moderately decreased function. The left ventricle demonstrates global hypokinesis. There is mild concentric left ventricular hypertrophy. There is the interventricular septum is flattened in systole and diastole, consistent with right ventricular pressure and volume overload. 3. Right ventricular systolic function is moderately reduced. The right ventricular size is moderately enlarged. There is moderately elevated pulmonary artery systolic pressure. The estimated right ventricular systolic pressure is 78.2 mmHg. 4. Left atrial size was  severely dilated. 5. Right atrial size was severely dilated. 6. Tricuspid valve regurgitation is moderate to severe. 7. The aortic valve is tricuspid. Aortic valve regurgitation is not visualized. Mild aortic valve stenosis. 8. The inferior vena cava is dilated in size with <50% respiratory variability, suggesting right atrial pressure of 15 mmHg. Comparison(s): No significant change from prior study. MR remains severe. Moderate to severe TR. RVSP improved.  Patient Profile     86 y.o. female with hx of CAD, CHF with PEA    Assessment & Plan    1 PEA arrest   Follow respiratory    2  CHF  Pt's BNP did come down a little with 1 dose IV lasix    Cr actually improved at 1.75    Will give one more dose 40 mg IV then switch to PO  Give 10 KCL   Echo yesterday showed LVEF rel unchanged from previous this admit   3  Mitral regurgitation REpeat echo yesterday MR remains severe  I don't think pt is a candidate for any aggressive intervention given age/medical issues   Watch I/O   Low Na  Keep in SR    4  Atrial fibrillation   She remains in SR on amiodarone   Had been persistent afib to this point.   I would continue as she will probably do better in SR given MR / CHF / Age  She is on 400 bid amiodarone to load  Will switch to 200 bid   Iwill pull back on toprol to 25 as HR has been 50s / 60s    Will need outpt f/u   5  CAD   No symptoms of angina   Iwould stop ASA since on Eliquis   5  Renal   Cr 1.75 today  Give 1 dose lasix IV   Then PO  Getting close to d/c   Will follow up with [facility] cardiology in [place] For questions or updates, please contact [place] Please consult [email or web address] for contact info under        Signed, Dorris Carnes, MD  08/01/2020, 7:00 AM

## 2020-08-01 NOTE — Progress Notes (Signed)
[facility] for Heparin Indication: chest pain/ACS, atrial fibrillation  No Known Allergies  Patient Measurements: Weight: 52.7 kg (116 lb 2.9 oz) Heparin Dosing Weight: 48 kg  Vital Signs: Temp: 98.3 F (36.8 C) (08/26 0407) Temp Source: Oral (08/26 0407) BP: 133/78 (08/26 0407) Pulse Rate: 56 (08/26 0407)  Labs: Recent Labs    07/29/20 1351 07/30/20 0424 07/30/20 0443 07/30/20 0846 07/30/20 1530 07/31/20 0627 07/31/20 1830 08/01/20 0523  HGB   < > 9.8*  --   --   --  9.9*  --  9.1*  HCT  --  29.5*  --   --   --  30.3*  --  28.0*  PLT  --  190  --   --   --  180  --  150  APTT  --  60*  --   --    < > 45* 48* 61*  HEPARINUNFRC  --   --  1.00*  --   --  0.65  --  0.57  CREATININE   < >  --   --  2.31*  --  1.91*  --  1.75*   < > = values in this interval not displayed.    Estimated Creatinine Clearance: 19.6 mL/min (A) (by C-G formula based on SCr of 1.75 mg/dL (H)).   Medical History: Past Medical History:  Diagnosis Date   Atrial fibrillation ([place])    Coronary artery disease    Hypertension     Assessment: 86 year old woman with hx of CAD, hx of cardiac stent, atrial fibrillation, HFpEF, HLD, HTN, PVD presented with SOB and subsequently suffered PEA arrest; received intubation and CPR in ED. Noted history of afib, anticoagulation note from 07/23/20 indicates that she was on apixaban prior to admit.  Heparin level 0.57 (likely affected by apixaban), PTT 61 sec (subtherapeutic) on gtt at 550 units/hr. No issues with line or bleeding reported per RN.  Goal of Therapy:  Heparin level 0.3-0.7 units/ml aPTT 66-102 seconds Monitor platelets by anticoagulation protocol: Yes   Plan:  Increase heparin infusion to 650 units/hr Check 8-hr aPTT  Sherlon Handing, PharmD, BCPS Please see amion for complete clinical pharmacist phone list 08/01/2020 6:20 AM

## 2020-08-01 NOTE — Care Management Important Message (Signed)
Important Message  Patient Details  Name: Shawna Curry MRN: [id number] Date of Birth: 11-30-1933   Medicare Important Message Given:  Yes     She[facility]a Altes 08/01/2020, 4:32 PM

## 2020-08-01 NOTE — Progress Notes (Addendum)
PROGRESS NOTE    Shawna Curry  ERD:[id number] DOB: July 26, 1933 DOA: 07/27/2020 PCP: Iva Lento, PA-C     Brief Narrative:  Shawna Curry is an 86 year old woman with history of CAD, history of cardiac stent, A Fib, previous left PCA stroke in March 2021, HLD, HTN, PAD who presented to the ED with shortness of breath. She suffered PEA arrest in the ED and was intubated, admitted to ICU on 8/21. Arrest without known etiology but possibly arrhythmogenic cause. Cardiology consulted. Patient transferred to progressive unit and TRH service on 8/25.  Due to ataxia, she underwent MRI brain which revealed acute right posterior cerebral stroke.  Neurology was consulted.  New events last 24 hours / Subjective: Patient has no complaints on exam this morning, states that she is feeling well without any shortness of breath. Finished breakfast without any issues.   Assessment & Plan:   Principal Problem:   Respiratory failure ([place]) Active Problems:   AKI (acute kidney injury) ([place])   Acute systolic heart failure (HCC)   Debility   Atrial fibrillation (HCC)   Dyslipidemia   Controlled type 2 diabetes mellitus with hyperglycemia (HCC)   Acute on chronic systolic (congestive) heart failure (HCC)   Cerebral thrombosis with cerebral infarction   S/p PEA cardiac arrest  -Stable and off vent support   Acute hypoxemic respiratory failure -Resolved, on room air now   Acute right posterior cerebral stroke -Hx of left PCA stroke in March 2021 at [facility]: Small acute posterior right cerebral infarct. Motion degraded incomplete exam.  -Plavix and eliquis PTA. Currently on Eliquis -Neurology following -Lipitor -CIR auth pending   Paroxysmal A Fib -Eliquis -Amiodarone, toprol  -Cardiology following   Acute on chronic systolic CHF  -BNP > 5631, improving now  -Echo EF 35-40%, global hypokinesis of LV, severe MR  -Lasix IV given 8/25, 1 additional dose ordered today and  switch to PO tomorrow  -Toprol  -Cardiology following   Acute metabolic encephalopathy  -S/p arrest -Seems to be back to her baseline mentation currently   AKI -Renal US: Increased echogenicity of renal parenchyma is noted bilaterally suggesting medical renal disease -Improving, Cr peaked at 2.8 and trended down to 1.75, trend BMP   DM -Novolog SSI   Transaminitis -Likely secondary to cardiac arrest. Improving LFTs   Leukocytosis  -Blood cultures negative -Urine culture negative -Respiratory failure negative  -Resolved -Sepsis ruled out   Elevated TSH -Need repeat outpatient follow up once above acute medical issues resolved     DVT prophylaxis: apixaban (ELIQUIS) tablet 2.5 mg Start: 08/01/20 0830 apixaban (ELIQUIS) tablet 2.5 mg   Code Status: Full Family Communication: Updated daughter over the phone 8/25.  No family at bedside today Disposition Plan:   Status is: Inpatient  Remains inpatient appropriate because:Inpatient level of care appropriate due to severity of illness   Dispo: The patient is from: Home              Anticipated d/c is to: CIR              Anticipated d/c date is: 1 day              Patient currently is not medically stable to d/c.  1 additional dose of IV Lasix.  Awaiting cardiology and neurology clearance for discharge.  May be ready on 8/27, pending insurance auth for CIR  Consultants:   PCCM   Cardiology  Neurology    Antimicrobials:  Anti-infectives (From admission,  onward)   Start     Dose/Rate Route Frequency Ordered Stop   07/29/20 2000  vancomycin (VANCOREADY) IVPB 500 mg/100 mL  Status:  Discontinued        500 mg 100 mL/hr over 60 Minutes Intravenous Every 48 hours 07/27/20 2055 07/28/20 0937   07/27/20 2200  vancomycin (VANCOCIN) IVPB 1000 mg/200 mL premix        1,000 mg 200 mL/hr over 60 Minutes Intravenous  Once 07/27/20 2055 07/28/20 0041   07/27/20 2200  ceFEPIme (MAXIPIME) 1 g in sodium chloride 0.9 % 100 mL  IVPB  Status:  Discontinued        1 g 200 mL/hr over 30 Minutes Intravenous Every 24 hours 07/27/20 2055 07/28/20 0937   07/27/20 1530  cefTRIAXone (ROCEPHIN) 1 g in sodium chloride 0.9 % 100 mL IVPB        1 g 200 mL/hr over 30 Minutes Intravenous  Once 07/27/20 1529 07/27/20 1618   07/27/20 1530  azithromycin (ZITHROMAX) 500 mg in sodium chloride 0.9 % 250 mL IVPB        500 mg 250 mL/hr over 60 Minutes Intravenous  Once 07/27/20 1529 07/27/20 1731       Objective: Vitals:   08/01/20 0744 08/01/20 0800 08/01/20 0900 08/01/20 1000  BP:  137/82 132/78 122/61  Pulse: (!) 58 (!) 57 61   Resp: 18     Temp: 98.5 F (36.9 C)     TempSrc: Oral     SpO2: 98% 98% 93%   Weight:        Intake/Output Summary (Last 24 hours) at 08/01/2020 1101 Last data filed at 08/01/2020 0517 Gross per 24 hour  Intake 212.2 ml  Output 200 ml  Net 12.2 ml   Filed Weights   07/29/20 0500 07/30/20 0500 08/01/20 0407  Weight: 53.7 kg 51.1 kg 52.7 kg    Examination: General exam: Appears calm and comfortable  Respiratory system: Clear to auscultation. Respiratory effort normal.  On room air Cardiovascular system: S1 & S2 heard, regular rate and rhythm, No pedal edema. Gastrointestinal system: Abdomen is nondistended, soft and nontender. Normal bowel sounds heard. Central nervous system: Alert, Speech clear  Extremities: Symmetric in appearance bilaterally  Skin: No rashes, lesions or ulcers on exposed skin  Psychiatry: Stable  Data Reviewed: I have personally reviewed following labs and imaging studies  CBC: Recent Labs  Lab 07/27/20 1250 07/27/20 1811 07/29/20 0236 07/29/20 1049 07/30/20 0424 07/31/20 0627 08/01/20 0523  WBC 6.1   < > 24.5* 23.5* 18.6* 11.3* 8.9  NEUTROABS 4.8  --   --   --   --   --   --   HGB 10.3*   < > 10.2* 9.4* 9.8* 9.9* 9.1*  HCT 32.8*   < > 33.6* 29.9* 29.5* 30.3* 28.0*  MCV 89.1   < > 92.6 88.7 85.8 86.3 84.8  PLT 221   < > 209 198 190 180 150   < > =  values in this interval not displayed.   Basic Metabolic Panel: Recent Labs  Lab 07/28/20 0653 07/28/20 1044 07/28/20 1517 07/29/20 0236 07/29/20 1351 07/29/20 2215 07/30/20 0846 07/31/20 0627 08/01/20 0523  NA 143  --   --   --  142  --  141 140 139  K 3.7  --   --   --  4.7  --  3.7 3.7 3.8  CL 103  --   --   --  105  --  105 106 104  CO2 20*  --   --   --  18*  --  21* 22 25  GLUCOSE 216*  --   --   --  196*  --  116* 108* 131*  BUN 45*  --   --   --  61*  --  63* 57* 53*  CREATININE 2.04*  --   --   --  2.80*  --  2.31* 1.91* 1.75*  CALCIUM 8.5*  --   --   --  8.5*  --  8.6* 8.6* 8.6*  MG 2.3 2.5* 2.8* 2.8*  --  2.3  --   --   --   PHOS 3.8 4.1 6.0* 7.2*  --  3.9  --   --   --    GFR: Estimated Creatinine Clearance: 19.6 mL/min (A) (by C-G formula based on SCr of 1.75 mg/dL (H)). Liver Function Tests: Recent Labs  Lab 07/25/20 1332 07/27/20 1250 07/28/20 0110 07/30/20 0846 07/31/20 0627  AST 53* 107* 184* 107* 75*  ALT 44 69* 120* 105* 85*  ALKPHOS 126 145* 130* 125 135*  BILITOT 1.1 1.1 1.4* 1.2 0.7  PROT 6.9 6.4* 5.4* 5.5* 5.1*  ALBUMIN 4.0 3.9 3.1* 3.3* 3.0*   Recent Labs  Lab 07/27/20 1250  LIPASE 43   No results for input(s): AMMONIA in the last 168 hours. Coagulation Profile: No results for input(s): INR, PROTIME in the last 168 hours. Cardiac Enzymes: Recent Labs  Lab 07/28/20 1044  CKTOTAL 54   BNP (last 3 results) No results for input(s): PROBNP in the last 8760 hours. HbA1C: No results for input(s): HGBA1C in the last 72 hours. CBG: Recent Labs  Lab 07/31/20 1628 07/31/20 2028 08/01/20 0054 08/01/20 0405 08/01/20 0743  GLUCAP 188* 122* 116* 106* 106*   Lipid Profile: No results for input(s): CHOL, HDL, LDLCALC, TRIG, CHOLHDL, LDLDIRECT in the last 72 hours. Thyroid Function Tests: No results for input(s): TSH, T4TOTAL, FREET4, T3FREE, THYROIDAB in the last 72 hours. Anemia Panel: No results for input(s): VITAMINB12, FOLATE,  FERRITIN, TIBC, IRON, RETICCTPCT in the last 72 hours. Sepsis Labs: Recent Labs  Lab 07/27/20 1250 07/27/20 1548 07/28/20 0110 07/28/20 0511 07/29/20 1049 07/30/20 0424 07/31/20 0627  PROCALCITON  --   --   --   --  4.55 4.82 2.68  LATICACIDVEN 5.9* 2.8* 3.7* 2.6*  --   --   --     Recent Results (from the past 240 hour(s))  SARS Coronavirus 2 by RT PCR (hospital order, performed in [facility] hospital lab) Nasopharyngeal Nasopharyngeal Swab     Status: None   Collection Time: 07/25/20  2:21 PM   Specimen: Nasopharyngeal Swab  Result Value Ref Range Status   SARS Coronavirus 2 NEGATIVE NEGATIVE Final    Comment: (NOTE) SARS-CoV-2 target nucleic acids are NOT DETECTED.  The SARS-CoV-2 RNA is generally detectable in upper and lower respiratory specimens during the acute phase of infection. The lowest concentration of SARS-CoV-2 viral copies this assay can detect is 250 copies / mL. A negative result does not preclude SARS-CoV-2 infection and should not be used as the sole basis for treatment or other patient management decisions.  A negative result may occur with improper specimen collection / handling, submission of specimen other than nasopharyngeal swab, presence of viral mutation(s) within the areas targeted by this assay, and inadequate number of viral copies (<250 copies / mL). A negative result must be combined with clinical observations, patient history, and  epidemiological information.  Fact Sheet for Patients:   StrictlyIdeas.no  Fact Sheet for Healthcare Providers: BankingDealers.co.za  This test is not yet approved or  cleared by the [place] FDA and has been authorized for detection and/or diagnosis of SARS-CoV-2 by FDA under an Emergency Use Authorization (EUA).  This EUA will remain in effect (meaning this test can be used) for the duration of the COVID-19 declaration under Section 564(b)(1) of the Act, 21  U.S.C. section 360bbb-3(b)(1), unless the authorization is terminated or revoked sooner.  Performed at [facility], [address] [place], [place] [address]   Blood culture (routine x 2)     Status: None   Collection Time: 07/27/20 12:45 PM   Specimen: BLOOD RIGHT FOREARM  Result Value Ref Range Status   Specimen Description   Final    BLOOD RIGHT FOREARM Performed at [facility], [address] [place], [place] [address]    Special Requests   Final    BOTTLES DRAWN AEROBIC AND ANAEROBIC Blood Culture adequate volume Performed at [facility], [address] [place], [place] [address]    Culture   Final    NO GROWTH 5 DAYS Performed at [facility], [place] [address] [place], [place] [address]    Report Status 08/01/2020 FINAL  Final  SARS Coronavirus 2 by RT PCR (hospital order, performed in [facility] hospital lab) Nasopharyngeal Nasopharyngeal Swab     Status: None   Collection Time: 07/27/20 12:50 PM   Specimen: Nasopharyngeal Swab  Result Value Ref Range Status   SARS Coronavirus 2 NEGATIVE NEGATIVE Final    Comment: (NOTE) SARS-CoV-2 target nucleic acids are NOT DETECTED.  The SARS-CoV-2 RNA is generally detectable in upper and lower respiratory specimens during the acute phase of infection. The lowest concentration of SARS-CoV-2 viral copies this assay can detect is 250 copies / mL. A negative result does not preclude SARS-CoV-2 infection and should not be used as the sole basis for treatment or other patient management decisions.  A negative result may occur with improper specimen collection / handling, submission of specimen other than nasopharyngeal swab, presence of viral mutation(s) within the areas targeted by this assay, and inadequate number of viral copies (<250 copies / mL). A negative result must be combined with clinical observations, patient history, and epidemiological information.  Fact Sheet for Patients:     StrictlyIdeas.no  Fact Sheet for Healthcare Providers: BankingDealers.co.za  This test is not yet approved or  cleared by the [place] FDA and has been authorized for detection and/or diagnosis of SARS-CoV-2 by FDA under an Emergency Use Authorization (EUA).  This EUA will remain in effect (meaning this test can be used) for the duration of the COVID-19 declaration under Section 564(b)(1) of the Act, 21 U.S.C. section 360bbb-3(b)(1), unless the authorization is terminated or revoked sooner.  Performed at [facility], [address] [place], [place] [address]   Blood culture (routine x 2)     Status: None   Collection Time: 07/27/20  1:00 PM   Specimen: BLOOD RIGHT FOREARM  Result Value Ref Range Status   Specimen Description   Final    BLOOD RIGHT FOREARM Performed at [facility], [address] [place], [place] [address]    Special Requests   Final    BOTTLES DRAWN AEROBIC ONLY Blood Culture results may not be optimal due to an inadequate volume of blood  received in culture bottles Performed at [facility], [address] [place], [place] [address]    Culture   Final    NO GROWTH 5 DAYS Performed at [facility], [place] [address], [place], [place] [address]    Report Status 08/01/2020 FINAL  Final  MRSA PCR Screening     Status: None   Collection Time: 07/27/20  7:53 PM   Specimen: Nasal Mucosa; Nasopharyngeal  Result Value Ref Range Status   MRSA by PCR NEGATIVE NEGATIVE Final    Comment:        The GeneXpert MRSA Assay (FDA approved for NASAL specimens only), is one component of a comprehensive MRSA colonization surveillance program. It is not intended to diagnose MRSA infection nor to guide or monitor treatment for MRSA infections. Performed at [facility], [place] [address], [place], [place] [address]   Urine Culture     Status: None   Collection Time: 07/29/20  11:11 AM   Specimen: Urine, Catheterized  Result Value Ref Range Status   Specimen Description URINE, CATHETERIZED  Final   Special Requests NONE  Final   Culture   Final    NO GROWTH Performed at [facility], [address] [place], [place] [address]    Report Status 07/30/2020 FINAL  Final  Culture, respiratory (non-expectorated)     Status: None (Preliminary result)   Collection Time: 07/29/20 11:38 AM   Specimen: Tracheal Aspirate; Respiratory  Result Value Ref Range Status   Specimen Description TRACHEAL ASPIRATE  Final   Special Requests NONE  Final   Gram Stain   Final    ABUNDANT WBC PRESENT,BOTH PMN AND MONONUCLEAR NO ORGANISMS SEEN Performed at [facility], [address] [place], [place] [address]    Culture FEW GRAM NEGATIVE RODS  Final   Report Status PENDING  Incomplete      Radiology Studies: MR BRAIN WO CONTRAST  Result Date: 07/30/2020 CLINICAL DATA:  Cardiac arrest. History of atrial fibrillation and stroke. EXAM: MRI HEAD WITHOUT CONTRAST TECHNIQUE: Multiplanar, multiecho pulse sequences of the brain and surrounding structures were obtained without intravenous contrast. COMPARISON:  Head CT 07/28/2020 and MRI 02/19/2020 FINDINGS: The study could not be completed due to the patient's condition. Axial and coronal diffusion, axial T2* gradient echo, axial FLAIR, and coronal T2 sequences were obtained and are motion degraded including severe motion on the gradient echo sequence. Brain: There is a small acute cortical and subcortical infarct in the posterior right cerebral hemisphere near the junction of the temporal, parietal, and occipital lobes (posterior MCA or border zone). No gross intracranial hemorrhage, mass/mass effect, or extra-axial fluid collection is identified. There is a chronic lacunar infarct at the lateral aspect of the left thalamus. T2 hyperintensities in the cerebral white matter bilaterally are similar to the prior MRI and are  nonspecific but compatible with mild-to-moderate chronic small vessel ischemic disease. There is mild cerebral atrophy. A chronic lacunar infarct or cyst in the left subinsular region is unchanged. Mild cerebral atrophy is not considered abnormal for age. Vascular: Limited assessment on this motion degraded, incomplete examination. Skull and upper cervical spine: No gross destructive skull lesion. Sinuses/Orbits: No acute findings. Other: None. IMPRESSION: 1. Motion degraded, incomplete examination. 2. Small acute posterior right cerebral infarct. 3. Mild-to-moderate chronic small vessel ischemic disease. Electronically Signed   By: Logan Bores M.D.   On: 07/30/2020 15:16   DG Chest Port 1 View  Result Date:  07/31/2020 CLINICAL DATA:  Respiratory failure. EXAM: PORTABLE CHEST 1 VIEW COMPARISON:  July 29, 2020. FINDINGS: Stable cardiomegaly. No pneumothorax is noted. Endotracheal and nasogastric tubes have been removed. Mild bibasilar subsegmental atelectasis is noted. Small right pleural effusion is noted. Degenerative changes are seen involving both glenohumeral joints. IMPRESSION: Mild bibasilar subsegmental atelectasis. Small right pleural effusion. Endotracheal and nasogastric tubes have been removed. Aortic Atherosclerosis (ICD10-I70.0). Electronically Signed   By: Marijo Conception M.D.   On: 07/31/2020 08:27   ECHOCARDIOGRAM LIMITED  Result Date: 07/31/2020    ECHOCARDIOGRAM LIMITED REPORT   Patient Name:   JATZIRI Kirchner Date of Exam: 07/31/2020 Medical Rec #:  [id number]     Height:       68.0 in Accession #:    [id number]    Weight:       112.7 lb Date of Birth:  03/22/1933     BSA:          1.601 m Patient Age:    78 years      BP:           106/67 mmHg Patient Gender: F             HR:           50 bpm. Exam Location:  Inpatient Procedure: Limited Echo, Cardiac Doppler and Color Doppler Indications:    Mitral valve insufficiency  History:        Patient has prior history of Echocardiogram  examinations, most                 recent 07/28/2020. Risk Factors:Former Smoker. Sepsis.                 Respiratory failure. Acute systolic heart failure.  Sonographer:    Clayton Lefort RDCS (AE) Referring Phys: [id number] [facility]  1. The PMVL is restricted in systole (IIIB pathology). The regurgitation jet is central (A2-P2) and directed centrally/posteriorly. The mitral valve is degenerative. Severe mitral valve regurgitation. No evidence of mitral stenosis.  2. Left ventricular ejection fraction, by estimation, is 35 to 40%. The left ventricle has moderately decreased function. The left ventricle demonstrates global hypokinesis. There is mild concentric left ventricular hypertrophy. There is the interventricular septum is flattened in systole and diastole, consistent with right ventricular pressure and volume overload.  3. Right ventricular systolic function is moderately reduced. The right ventricular size is moderately enlarged. There is moderately elevated pulmonary artery systolic pressure. The estimated right ventricular systolic pressure is 72.0 mmHg.  4. Left atrial size was severely dilated.  5. Right atrial size was severely dilated.  6. Tricuspid valve regurgitation is moderate to severe.  7. The aortic valve is tricuspid. Aortic valve regurgitation is not visualized. Mild aortic valve stenosis.  8. The inferior vena cava is dilated in size with <50% respiratory variability, suggesting right atrial pressure of 15 mmHg. Comparison(s): No significant change from prior study. MR remains severe. Moderate to severe TR. RVSP improved. FINDINGS  Left Ventricle: Left ventricular ejection fraction, by estimation, is 35 to 40%. The left ventricle has moderately decreased function. The left ventricle demonstrates global hypokinesis. The left ventricular internal cavity size was normal in size. There is mild concentric left ventricular hypertrophy. The interventricular septum is flattened in systole  and diastole, consistent with right ventricular pressure and volume overload. Right Ventricle: The right ventricular size is moderately enlarged. No increase in right ventricular wall thickness. Right ventricular systolic function is moderately reduced. There is  moderately elevated pulmonary artery systolic pressure. The tricuspid  regurgitant velocity is 2.79 m/s, and with an assumed right atrial pressure of 15 mmHg, the estimated right ventricular systolic pressure is 29.4 mmHg. Left Atrium: Left atrial size was severely dilated. Right Atrium: Right atrial size was severely dilated. Pericardium: Trivial pericardial effusion is present. Mitral Valve: The PMVL is restricted in systole (IIIB pathology). The regurgitation jet is central (A2-P2) and directed centrally/posteriorly. The mitral valve is degenerative in appearance. There is mild calcification of the anterior and posterior mitral valve leaflet(s). Severe mitral valve regurgitation. No evidence of mitral valve stenosis. Tricuspid Valve: The tricuspid valve is grossly normal. Tricuspid valve regurgitation is moderate to severe. Aortic Valve: The aortic valve is tricuspid. . There is moderate thickening and moderate calcification of the aortic valve. Aortic valve regurgitation is not visualized. Mild aortic stenosis is present. There is moderate thickening of the aortic valve. There is moderate calcification of the aortic valve. Pulmonic Valve: The pulmonic valve was grossly normal. Pulmonic valve regurgitation is trivial. No evidence of pulmonic stenosis. Venous: The inferior vena cava is dilated in size with less than 50% respiratory variability, suggesting right atrial pressure of 15 mmHg. Additional Comments: There is a small pleural effusion in the left lateral region. LEFT VENTRICLE PLAX 2D LVIDd:         4.60 cm LVIDs:         3.10 cm LV PW:         1.10 cm LV IVS:        1.40 cm LVOT diam:     2.00 cm LVOT Area:     3.14 cm  LV Volumes (MOD) LV vol d,  MOD A2C: 62.7 ml LV vol d, MOD A4C: 83.7 ml LV vol s, MOD A2C: 33.8 ml LV vol s, MOD A4C: 45.1 ml LV SV MOD A2C:     28.9 ml LV SV MOD A4C:     83.7 ml LV SV MOD BP:      32.1 ml IVC IVC diam: 2.30 cm LEFT ATRIUM         Index LA diam:    4.00 cm 2.50 cm/m   AORTA Ao Root diam: 3.00 cm MR Peak grad:    88.4 mmHg   TRICUSPID VALVE MR Mean grad:    51.0 mmHg   TR Peak grad:   31.1 mmHg MR Vmax:         470.00 cm/s TR Vmax:        279.00 cm/s MR Vmean:        329.0 cm/s MR PISA:         4.02 cm    SHUNTS MR PISA Eff ROA: 34 mm      Systemic Diam: 2.00 cm MR PISA Radius:  0.80 cm Eleonore Chiquito MD Electronically signed by Eleonore Chiquito MD Signature Date/Time: 07/31/2020/11:26:10 AM    Final       Scheduled Meds:  acetaminophen  650 mg Oral Q4H   amiodarone  200 mg Oral BID   apixaban  2.5 mg Oral BID   atorvastatin  40 mg Oral Daily   Chlorhexidine Gluconate Cloth  6 each Topical Daily   furosemide  40 mg Intravenous Once   [START ON 08/02/2020] furosemide  40 mg Oral Daily   gabapentin  300 mg Oral BID   insulin aspart  0-15 Units Subcutaneous TID WC   mouth rinse  15 mL Mouth Rinse q12n4p   [START ON 08/02/2020] metoprolol succinate  25 mg Oral Daily   potassium chloride  10 mEq Oral Daily   Continuous Infusions:  sodium chloride Stopped (07/27/20 1737)     LOS: 5 days      Time spent: 25 minutes   Dessa Phi, DO [facility] 08/01/2020, 11:01 AM   Available via Epic secure chat 7am-7pm After these hours, please refer to coverage provider listed on [email or web address]

## 2020-08-02 ENCOUNTER — Inpatient Hospital Stay (HOSPITAL_COMMUNITY)
Admission: RE | Admit: 2020-08-02 | Discharge: 2020-08-21 | DRG: 056 | Disposition: A | Discharge status: Home Health | Payer: Medicare HMO | Source: Intra-hospital | Attending: Physical Medicine & Rehabilitation | Admitting: Physical Medicine & Rehabilitation

## 2020-08-02 ENCOUNTER — Inpatient Hospital Stay (HOSPITAL_COMMUNITY): Payer: Medicare HMO | Admitting: Speech Pathology

## 2020-08-02 ENCOUNTER — Inpatient Hospital Stay (HOSPITAL_COMMUNITY): Payer: Medicare HMO

## 2020-08-02 ENCOUNTER — Inpatient Hospital Stay (HOSPITAL_COMMUNITY): Payer: Medicare HMO | Admitting: Occupational Therapy

## 2020-08-02 ENCOUNTER — Other Ambulatory Visit: Payer: Self-pay

## 2020-08-02 ENCOUNTER — Encounter (HOSPITAL_COMMUNITY): Payer: Self-pay | Admitting: Physical Medicine & Rehabilitation

## 2020-08-02 DIAGNOSIS — M79674 Pain in right toe(s): Secondary | ICD-10-CM | POA: Diagnosis not present

## 2020-08-02 DIAGNOSIS — I6330 Cerebral infarction due to thrombosis of unspecified cerebral artery: Secondary | ICD-10-CM

## 2020-08-02 DIAGNOSIS — Z8674 Personal history of sudden cardiac arrest: Secondary | ICD-10-CM | POA: Diagnosis not present

## 2020-08-02 DIAGNOSIS — R269 Unspecified abnormalities of gait and mobility: Secondary | ICD-10-CM | POA: Diagnosis present

## 2020-08-02 DIAGNOSIS — N179 Acute kidney failure, unspecified: Secondary | ICD-10-CM | POA: Diagnosis present

## 2020-08-02 DIAGNOSIS — I214 Non-ST elevation (NSTEMI) myocardial infarction: Secondary | ICD-10-CM | POA: Diagnosis present

## 2020-08-02 DIAGNOSIS — I2510 Atherosclerotic heart disease of native coronary artery without angina pectoris: Secondary | ICD-10-CM | POA: Diagnosis present

## 2020-08-02 DIAGNOSIS — I69319 Unspecified symptoms and signs involving cognitive functions following cerebral infarction: Secondary | ICD-10-CM | POA: Diagnosis not present

## 2020-08-02 DIAGNOSIS — Z7984 Long term (current) use of oral hypoglycemic drugs: Secondary | ICD-10-CM

## 2020-08-02 DIAGNOSIS — I4891 Unspecified atrial fibrillation: Secondary | ICD-10-CM | POA: Diagnosis present

## 2020-08-02 DIAGNOSIS — G8191 Hemiplegia, unspecified affecting right dominant side: Secondary | ICD-10-CM

## 2020-08-02 DIAGNOSIS — E119 Type 2 diabetes mellitus without complications: Secondary | ICD-10-CM

## 2020-08-02 DIAGNOSIS — R7309 Other abnormal glucose: Secondary | ICD-10-CM

## 2020-08-02 DIAGNOSIS — E11649 Type 2 diabetes mellitus with hypoglycemia without coma: Secondary | ICD-10-CM | POA: Diagnosis not present

## 2020-08-02 DIAGNOSIS — I69351 Hemiplegia and hemiparesis following cerebral infarction affecting right dominant side: Secondary | ICD-10-CM | POA: Diagnosis not present

## 2020-08-02 DIAGNOSIS — I110 Hypertensive heart disease with heart failure: Secondary | ICD-10-CM | POA: Diagnosis present

## 2020-08-02 DIAGNOSIS — I639 Cerebral infarction, unspecified: Secondary | ICD-10-CM | POA: Diagnosis present

## 2020-08-02 DIAGNOSIS — E1165 Type 2 diabetes mellitus with hyperglycemia: Secondary | ICD-10-CM | POA: Diagnosis present

## 2020-08-02 DIAGNOSIS — I6522 Occlusion and stenosis of left carotid artery: Secondary | ICD-10-CM | POA: Diagnosis present

## 2020-08-02 DIAGNOSIS — E875 Hyperkalemia: Secondary | ICD-10-CM

## 2020-08-02 DIAGNOSIS — I5023 Acute on chronic systolic (congestive) heart failure: Secondary | ICD-10-CM | POA: Diagnosis present

## 2020-08-02 DIAGNOSIS — Z86718 Personal history of other venous thrombosis and embolism: Secondary | ICD-10-CM | POA: Diagnosis not present

## 2020-08-02 DIAGNOSIS — D649 Anemia, unspecified: Secondary | ICD-10-CM

## 2020-08-02 DIAGNOSIS — I69391 Dysphagia following cerebral infarction: Secondary | ICD-10-CM

## 2020-08-02 DIAGNOSIS — E785 Hyperlipidemia, unspecified: Secondary | ICD-10-CM | POA: Diagnosis present

## 2020-08-02 DIAGNOSIS — I69311 Memory deficit following cerebral infarction: Secondary | ICD-10-CM | POA: Diagnosis not present

## 2020-08-02 DIAGNOSIS — I480 Paroxysmal atrial fibrillation: Secondary | ICD-10-CM

## 2020-08-02 LAB — HEPATIC FUNCTION PANEL
ALT: 59 U/L — ABNORMAL HIGH (ref 0–44)
AST: 41 U/L (ref 15–41)
Albumin: 2.6 g/dL — ABNORMAL LOW (ref 3.5–5.0)
Alkaline Phosphatase: 142 U/L — ABNORMAL HIGH (ref 38–126)
Bilirubin, Direct: 0.2 mg/dL (ref 0.0–0.2)
Indirect Bilirubin: 0.3 mg/dL (ref 0.3–0.9)
Total Bilirubin: 0.5 mg/dL (ref 0.3–1.2)
Total Protein: 4.9 g/dL — ABNORMAL LOW (ref 6.5–8.1)

## 2020-08-02 LAB — BASIC METABOLIC PANEL
Anion gap: 11 (ref 5–15)
BUN: 48 mg/dL — ABNORMAL HIGH (ref 8–23)
CO2: 25 mmol/L (ref 22–32)
Calcium: 8.6 mg/dL — ABNORMAL LOW (ref 8.9–10.3)
Chloride: 106 mmol/L (ref 98–111)
Creatinine, Ser: 1.44 mg/dL — ABNORMAL HIGH (ref 0.44–1.00)
GFR calc Af Amer: 38 mL/min — ABNORMAL LOW (ref >60)
GFR calc non Af Amer: 33 mL/min — ABNORMAL LOW (ref >60)
Glucose, Bld: 117 mg/dL — ABNORMAL HIGH (ref 70–99)
Potassium: 3.9 mmol/L (ref 3.5–5.1)
Sodium: 142 mmol/L (ref 135–145)

## 2020-08-02 LAB — LIPID PANEL
Cholesterol: 114 mg/dL (ref 0–200)
HDL: 41 mg/dL (ref >40)
LDL Cholesterol: 61 mg/dL (ref 0–99)
Total CHOL/HDL Ratio: 2.8 RATIO
Triglycerides: 58 mg/dL (ref <150)
VLDL: 12 mg/dL (ref 0–40)

## 2020-08-02 LAB — GLUCOSE, CAPILLARY
Glucose-Capillary: 84 mg/dL (ref 70–99)
Glucose-Capillary: 118 mg/dL — ABNORMAL HIGH (ref 70–99)
Glucose-Capillary: 110 mg/dL — ABNORMAL HIGH (ref 70–99)
Glucose-Capillary: 189 mg/dL — ABNORMAL HIGH (ref 70–99)

## 2020-08-02 MED ORDER — ATORVASTATIN CALCIUM 40 MG PO TABS
40.0000 mg | ORAL_TABLET | Freq: Every day | ORAL | Status: DC
  Administered 2020-08-03 – 2020-08-21 (×19): 40 mg via ORAL
  Filled 2020-08-02 (×19): qty 1

## 2020-08-02 MED ORDER — AMIODARONE HCL 200 MG PO TABS
200.0000 mg | ORAL_TABLET | Freq: Two times a day (BID) | ORAL | Status: DC
  Administered 2020-08-02 – 2020-08-14 (×24): 200 mg via ORAL
  Filled 2020-08-02 (×24): qty 1

## 2020-08-02 MED ORDER — AMIODARONE HCL 200 MG PO TABS: 200.0000 mg | ORAL_TABLET | Freq: Two times a day (BID) | ORAL | Status: DC

## 2020-08-02 MED ORDER — METOPROLOL SUCCINATE ER 25 MG PO TB24
25.0000 mg | ORAL_TABLET | Freq: Every day | ORAL | 0 refills | Status: DC
Start: 2020-08-02 — End: 2020-08-20

## 2020-08-02 MED ORDER — INSULIN ASPART 100 UNIT/ML ~~LOC~~ SOLN
0.0000 [IU] | Freq: Three times a day (TID) | SUBCUTANEOUS | Status: DC
  Administered 2020-08-03 – 2020-08-04 (×3): 3 [IU] via SUBCUTANEOUS
  Administered 2020-08-04 – 2020-08-05 (×2): 2 [IU] via SUBCUTANEOUS
  Administered 2020-08-06 – 2020-08-11 (×3): 3 [IU] via SUBCUTANEOUS
  Administered 2020-08-17: 2 [IU] via SUBCUTANEOUS

## 2020-08-02 MED ORDER — SORBITOL 70 % SOLN
30.0000 mL | Freq: Every day | Status: DC | PRN
  Administered 2020-08-17: 30 mL via ORAL
  Filled 2020-08-02: qty 30

## 2020-08-02 MED ORDER — DOCUSATE SODIUM 100 MG PO CAPS
100.0000 mg | ORAL_CAPSULE | Freq: Two times a day (BID) | ORAL | Status: DC
  Administered 2020-08-02 – 2020-08-21 (×38): 100 mg via ORAL
  Filled 2020-08-02 (×38): qty 1

## 2020-08-02 MED ORDER — METOPROLOL SUCCINATE ER 25 MG PO TB24
25.0000 mg | ORAL_TABLET | Freq: Every day | ORAL | Status: DC
  Administered 2020-08-03 – 2020-08-21 (×19): 25 mg via ORAL
  Filled 2020-08-02 (×19): qty 1

## 2020-08-02 MED ORDER — FUROSEMIDE 40 MG PO TABS
40.0000 mg | ORAL_TABLET | Freq: Every day | ORAL | Status: DC
  Administered 2020-08-03 – 2020-08-21 (×19): 40 mg via ORAL
  Filled 2020-08-02 (×19): qty 1

## 2020-08-02 MED ORDER — APIXABAN 2.5 MG PO TABS
2.5000 mg | ORAL_TABLET | Freq: Two times a day (BID) | ORAL | Status: DC
  Administered 2020-08-02 – 2020-08-21 (×38): 2.5 mg via ORAL
  Filled 2020-08-02 (×38): qty 1

## 2020-08-02 MED ORDER — ATORVASTATIN CALCIUM 40 MG PO TABS
40.0000 mg | ORAL_TABLET | Freq: Every evening | ORAL | Status: DC
Start: 2020-08-02 — End: 2020-08-20

## 2020-08-02 MED ORDER — POTASSIUM CHLORIDE CRYS ER 10 MEQ PO TBCR
10.0000 meq | EXTENDED_RELEASE_TABLET | Freq: Every day | ORAL | Status: DC
  Administered 2020-08-03 – 2020-08-20 (×18): 10 meq via ORAL
  Filled 2020-08-02 (×18): qty 1

## 2020-08-02 MED ORDER — ACETAMINOPHEN 325 MG PO TABS
650.0000 mg | ORAL_TABLET | Freq: Four times a day (QID) | ORAL | Status: DC | PRN
  Administered 2020-08-03 – 2020-08-21 (×25): 650 mg via ORAL
  Filled 2020-08-02 (×25): qty 2

## 2020-08-02 MED ORDER — GABAPENTIN 600 MG PO TABS
300.0000 mg | ORAL_TABLET | Freq: Two times a day (BID) | ORAL | Status: DC
  Administered 2020-08-02 – 2020-08-03 (×2): 300 mg via ORAL
  Filled 2020-08-02 (×2): qty 1

## 2020-08-02 NOTE — Progress Notes (Signed)
Patient arrived on unit, oriented to unit. Reviewed medications, therapy schedule, rehab routine and plan of care. States an understanding of information reviewed. No complications noted at this time. Patient reports no pain and is [place]

## 2020-08-02 NOTE — Progress Notes (Signed)
Progress Note  Patient Name: Shawna Curry Date of Encounter: 08/02/2020  [facility] Cardiologist: (In [facility]  [facility]  Dr Marijo File) Subjective   Patient is comfortable laying in bed   No CP   " I live at home with my mother"  Inpatient Medications    Scheduled Meds: . acetaminophen  650 mg Oral Q4H  . amiodarone  200 mg Oral BID  . apixaban  2.5 mg Oral BID  . atorvastatin  40 mg Oral Daily  . Chlorhexidine Gluconate Cloth  6 each Topical Daily  . furosemide  40 mg Oral Daily  . gabapentin  300 mg Oral BID  . insulin aspart  0-15 Units Subcutaneous TID WC  . mouth rinse  15 mL Mouth Rinse q12n4p  . metoprolol succinate  25 mg Oral Daily  . potassium chloride  10 mEq Oral Daily   Continuous Infusions: . sodium chloride Stopped (07/27/20 1737)   PRN Meds: sodium chloride, docusate, lip balm   Vital Signs    Vitals:   08/01/20 0900 08/01/20 1000 08/01/20 1630 08/02/20 0002  BP: 132/78 122/61 132/77 124/67  Pulse: 61  84 (!) 58  Resp:   19 15  Temp:   98.2 F (36.8 C) 98.7 F (37.1 C)  TempSrc:   Oral Axillary  SpO2: 93%  92% 98%  Weight:        Intake/Output Summary (Last 24 hours) at 08/02/2020 0447 Last data filed at 08/01/2020 2000 Gross per 24 hour  Intake 550.55 ml  Output 500 ml  Net 50.55 ml   In/Out  266  Last 3 Weights 08/01/2020 07/30/2020 07/29/2020  Weight (lbs) 116 lb 2.9 oz 112 lb 10.5 oz 118 lb 6.2 oz  Weight (kg) 52.7 kg 51.1 kg 53.7 kg      Telemetry    SR with 5 beat NSVT  - Personally Reviewed  ECG    No new - Personally Reviewed  Physical Exam   GEN: No acute distress.   Neck: External JVP is increased   Cardiac: RRR  Gr III/VI systolic murmur at apex   Respiratory: Clear to auscultation bilaterally. GI: Soft, nontender, non-distended  MS: No LE edema; Neuro:  Nonfocal    Labs    High Sensitivity Troponin:   Recent Labs  Lab 07/25/20 1332 07/25/20 1523 07/27/20 1250 07/27/20 1548 07/28/20 0110  TROPONINIHS 29* 26*  26* 21* 61*      Chemistry Recent Labs  Lab 07/28/20 0110 07/28/20 0333 07/30/20 0846 07/31/20 0627 08/01/20 0523  NA 140   < > 141 140 139  K 3.8   < > 3.7 3.7 3.8  CL 104   < > 105 106 104  CO2 19*   < > 21* 22 25  GLUCOSE 230*   < > 116* 108* 131*  BUN 45*   < > 63* 57* 53*  CREATININE 1.89*   < > 2.31* 1.91* 1.75*  CALCIUM 8.2*   < > 8.6* 8.6* 8.6*  PROT 5.4*  --  5.5* 5.1*  --   ALBUMIN 3.1*  --  3.3* 3.0*  --   AST 184*  --  107* 75*  --   ALT 120*  --  105* 85*  --   ALKPHOS 130*  --  125 135*  --   BILITOT 1.4*  --  1.2 0.7  --   GFRNONAA 24*   < > 19* 23* 26*  GFRAA 28*   < > 22* 27* 30*  ANIONGAP  17*   < > 15 12 10    < > = values in this interval not displayed.     Hematology Recent Labs  Lab 07/30/20 0424 07/31/20 0627 08/01/20 0523  WBC 18.6* 11.3* 8.9  RBC 3.44* 3.51* 3.30*  HGB 9.8* 9.9* 9.1*  HCT 29.5* 30.3* 28.0*  MCV 85.8 86.3 84.8  MCH 28.5 28.2 27.6  MCHC 33.2 32.7 32.5  RDW 16.5* 17.2* 16.7*  PLT 190 180 150    BNP Recent Labs  Lab 07/30/20 0424 07/31/20 0627 08/01/20 0523  BNP >4,500.0* 2,982.0* 2,436.0*     DDimer  Recent Labs  Lab 07/27/20 1250  DDIMER 2.57*     Radiology    DG Chest Port 1 View  Result Date: 07/31/2020 CLINICAL DATA:  Respiratory failure. EXAM: PORTABLE CHEST 1 VIEW COMPARISON:  July 29, 2020. FINDINGS: Stable cardiomegaly. No pneumothorax is noted. Endotracheal and nasogastric tubes have been removed. Mild bibasilar subsegmental atelectasis is noted. Small right pleural effusion is noted. Degenerative changes are seen involving both glenohumeral joints. IMPRESSION: Mild bibasilar subsegmental atelectasis. Small right pleural effusion. Endotracheal and nasogastric tubes have been removed. Aortic Atherosclerosis (ICD10-I70.0). Electronically Signed   By: Marijo Conception M.D.   On: 07/31/2020 08:27   VAS US CAROTID  Result Date: 08/01/2020 Carotid Arterial Duplex Study Indications:       CVA. Comparison  Study:  No prior study Performing Technologist: Maudry Mayhew MHA, RDMS, RVT, RDCS  Examination Guidelines: A complete evaluation includes B-mode imaging, spectral Doppler, color Doppler, and power Doppler as needed of all accessible portions of each vessel. Bilateral testing is considered an integral part of a complete examination. Limited examinations for reoccurring indications may be performed as noted.  Right Carotid Findings: +----------+--------+--------+--------+--------------------------+--------+           PSV cm/sEDV cm/sStenosisPlaque Description        Comments +----------+--------+--------+--------+--------------------------+--------+ CCA Prox  49      8                                                  +----------+--------+--------+--------+--------------------------+--------+ CCA Distal41      7               smooth and heterogenous            +----------+--------+--------+--------+--------------------------+--------+ ICA Prox  27      9               smooth and heterogenous            +----------+--------+--------+--------+--------------------------+--------+ ICA Distal68      15                                                 +----------+--------+--------+--------+--------------------------+--------+ ECA       69      6               heterogenous and irregular         +----------+--------+--------+--------+--------------------------+--------+ +----------+--------+-------+----------------+-------------------+           PSV cm/sEDV cmsDescribe        Arm Pressure (mmHG) +----------+--------+-------+----------------+-------------------+ [id number]             Multiphasic, WNL                    +----------+--------+-------+----------------+-------------------+ +---------+--------+--+--------+-+---------+  VertebralPSV cm/s31EDV cm/s7Antegrade +---------+--------+--+--------+-+---------+  Left Carotid Findings:  +----------+-------+-------+--------+---------------------------------+--------+           PSV    EDV    StenosisPlaque Description               Comments           cm/s   cm/s                                                     +----------+-------+-------+--------+---------------------------------+--------+ CCA Prox  68     10                                                       +----------+-------+-------+--------+---------------------------------+--------+ CCA Distal63     13             smooth and heterogenous                   +----------+-------+-------+--------+---------------------------------+--------+ ICA Prox  177    51             heterogenous, irregular and                                               calcific                                  +----------+-------+-------+--------+---------------------------------+--------+ ICA Distal72     22                                                       +----------+-------+-------+--------+---------------------------------+--------+ ECA       67     10             calcific                                  +----------+-------+-------+--------+---------------------------------+--------+ +----------+--------+--------+----------------+-------------------+           PSV cm/sEDV cm/sDescribe        Arm Pressure (mmHG) +----------+--------+--------+----------------+-------------------+ [id number]             Multiphasic, WNL                    +----------+--------+--------+----------------+-------------------+ +---------+--------+--+--------+-+---------+ VertebralPSV cm/s48EDV cm/s7Antegrade +---------+--------+--+--------+-+---------+   Summary: Right Carotid: Velocities in the right ICA are consistent with a 1-39% stenosis. Left Carotid: Velocities in the left ICA are consistent with a 40-59% stenosis. Vertebrals:  Bilateral vertebral arteries demonstrate antegrade flow. Subclavians:  Normal flow hemodynamics were seen in bilateral subclavian              arteries. *See table(s) above for measurements and observations.  Electronically signed by Monica Martinez MD on 08/01/2020 at 3:29:12 PM.    Final    ECHOCARDIOGRAM LIMITED  Result Date:  07/31/2020    ECHOCARDIOGRAM LIMITED REPORT   Patient Name:   ZARIYAH Avina Date of Exam: 07/31/2020 Medical Rec #:  [id number]     Height:       68.0 in Accession #:    [id number]    Weight:       112.7 lb Date of Birth:  [birth year]     BSA:          1.601 m Patient Age:    60 years      BP:           106/67 mmHg Patient Gender: F             HR:           50 bpm. Exam Location:  Inpatient Procedure: Limited Echo, Cardiac Doppler and Color Doppler Indications:    Mitral valve insufficiency  History:        Patient has prior history of Echocardiogram examinations, most                 recent 07/28/2020. Risk Factors:Former Smoker. Sepsis.                 Respiratory failure. Acute systolic heart failure.  Sonographer:    Clayton Lefort RDCS (AE) Referring Phys: [id number] [place]  1. The PMVL is restricted in systole (IIIB pathology). The regurgitation jet is central (A2-P2) and directed centrally/posteriorly. The mitral valve is degenerative. Severe mitral valve regurgitation. No evidence of mitral stenosis.  2. Left ventricular ejection fraction, by estimation, is 35 to 40%. The left ventricle has moderately decreased function. The left ventricle demonstrates global hypokinesis. There is mild concentric left ventricular hypertrophy. There is the interventricular septum is flattened in systole and diastole, consistent with right ventricular pressure and volume overload.  3. Right ventricular systolic function is moderately reduced. The right ventricular size is moderately enlarged. There is moderately elevated pulmonary artery systolic pressure. The estimated right ventricular systolic pressure is 67.1 mmHg.  4. Left atrial size was severely  dilated.  5. Right atrial size was severely dilated.  6. Tricuspid valve regurgitation is moderate to severe.  7. The aortic valve is tricuspid. Aortic valve regurgitation is not visualized. Mild aortic valve stenosis.  8. The inferior vena cava is dilated in size with <50% respiratory variability, suggesting right atrial pressure of 15 mmHg. Comparison(s): No significant change from prior study. MR remains severe. Moderate to severe TR. RVSP improved. FINDINGS  Left Ventricle: Left ventricular ejection fraction, by estimation, is 35 to 40%. The left ventricle has moderately decreased function. The left ventricle demonstrates global hypokinesis. The left ventricular internal cavity size was normal in size. There is mild concentric left ventricular hypertrophy. The interventricular septum is flattened in systole and diastole, consistent with right ventricular pressure and volume overload. Right Ventricle: The right ventricular size is moderately enlarged. No increase in right ventricular wall thickness. Right ventricular systolic function is moderately reduced. There is moderately elevated pulmonary artery systolic pressure. The tricuspid  regurgitant velocity is 2.79 m/s, and with an assumed right atrial pressure of 15 mmHg, the estimated right ventricular systolic pressure is 24.5 mmHg. Left Atrium: Left atrial size was severely dilated. Right Atrium: Right atrial size was severely dilated. Pericardium: Trivial pericardial effusion is present. Mitral Valve: The PMVL is restricted in systole (IIIB pathology). The regurgitation jet is central (A2-P2) and directed centrally/posteriorly. The mitral valve is degenerative in appearance. There is mild calcification of the anterior and posterior mitral valve  leaflet(s). Severe mitral valve regurgitation. No evidence of mitral valve stenosis. Tricuspid Valve: The tricuspid valve is grossly normal. Tricuspid valve regurgitation is moderate to severe. Aortic Valve: The aortic  valve is tricuspid. . There is moderate thickening and moderate calcification of the aortic valve. Aortic valve regurgitation is not visualized. Mild aortic stenosis is present. There is moderate thickening of the aortic valve. There is moderate calcification of the aortic valve. Pulmonic Valve: The pulmonic valve was grossly normal. Pulmonic valve regurgitation is trivial. No evidence of pulmonic stenosis. Venous: The inferior vena cava is dilated in size with less than 50% respiratory variability, suggesting right atrial pressure of 15 mmHg. Additional Comments: There is a small pleural effusion in the left lateral region. LEFT VENTRICLE PLAX 2D LVIDd:         4.60 cm LVIDs:         3.10 cm LV PW:         1.10 cm LV IVS:        1.40 cm LVOT diam:     2.00 cm LVOT Area:     3.14 cm  LV Volumes (MOD) LV vol d, MOD A2C: 62.7 ml LV vol d, MOD A4C: 83.7 ml LV vol s, MOD A2C: 33.8 ml LV vol s, MOD A4C: 45.1 ml LV SV MOD A2C:     28.9 ml LV SV MOD A4C:     83.7 ml LV SV MOD BP:      32.1 ml IVC IVC diam: 2.30 cm LEFT ATRIUM         Index LA diam:    4.00 cm 2.50 cm/m   AORTA Ao Root diam: 3.00 cm MR Peak grad:    88.4 mmHg   TRICUSPID VALVE MR Mean grad:    51.0 mmHg   TR Peak grad:   31.1 mmHg MR Vmax:         470.00 cm/s TR Vmax:        279.00 cm/s MR Vmean:        329.0 cm/s MR PISA:         4.02 cm    SHUNTS MR PISA Eff ROA: 34 mm      Systemic Diam: 2.00 cm MR PISA Radius:  0.80 cm Eleonore Chiquito MD Electronically signed by Eleonore Chiquito MD Signature Date/Time: 07/31/2020/11:26:10 AM    Final     Cardiac Studies   1. The PMVL is restricted in systole (IIIB pathology). The regurgitation jet is central (A2-P2) and directed centrally/posteriorly. The mitral valve is degenerative. Severe mitral valve regurgitation. No evidence of mitral stenosis. 2. Left ventricular ejection fraction, by estimation, is 35 to 40%. The left ventricle has moderately decreased function. The left ventricle demonstrates global  hypokinesis. There is mild concentric left ventricular hypertrophy. There is the interventricular septum is flattened in systole and diastole, consistent with right ventricular pressure and volume overload. 3. Right ventricular systolic function is moderately reduced. The right ventricular size is moderately enlarged. There is moderately elevated pulmonary artery systolic pressure. The estimated right ventricular systolic pressure is 46.5 mmHg. 4. Left atrial size was severely dilated. 5. Right atrial size was severely dilated. 6. Tricuspid valve regurgitation is moderate to severe. 7. The aortic valve is tricuspid. Aortic valve regurgitation is not visualized. Mild aortic valve stenosis. 8. The inferior vena cava is dilated in size with <50% respiratory variability, suggesting right atrial pressure of 15 mmHg. Comparison(s): No significant change from prior study. MR remains severe. Moderate to severe TR. RVSP  improved.  Patient Profile     86 y.o. female with hx of CAD, CHF with PEA    Assessment & Plan    1   Acute on chronic systolic CHF LVEF 35 to 09%    2  Mitral regurgitation Repeat echo shows  MR is indeed  severe  Pt is not a candidate for any aggressive intervention given age/medical issues   Watch I/O   Low Na  Keep in SR    3  Hx of Atrial fibrillation  Pt had it persistently.  No she remains in SR on amiodarone     I would continue as she will probably do better in SR given MR / CHF / Age  Currently on 200 bid   I will pull back on toprol to 25 as HR has been 10s / 60s    Will need outpt f/u   4  CAD   No symptoms of angina   I would stop ASA since on Eliquis   5  Renal   Cr 1.44  Getting close to d/c   Will follow up with [facility] cardiology in [place] For questions or updates, please contact [place] Please consult [email or web address] for contact info under        Signed, Dorris Carnes, MD  08/02/2020, 4:47 AM

## 2020-08-02 NOTE — Discharge Summary (Addendum)
Physician Discharge Summary  Shawna Curry YOV:[id number] DOB: 06/21/1933 DOA: 07/27/2020  PCP: Iva Lento, PA-C  Admit date: 07/27/2020 Discharge date: 08/02/2020  Disposition:  CIR  Recommendations for Outpatient Follow-up:  Follow up with PCP in 1 week Follow up with Cardiology Dr. Marijo File  Follow up with Neurology Buford Dresser, NP Repeat TSH in 4-6 weeks   Discharge Condition: Stable CODE STATUS: Full  Diet recommendation:  Diet Orders (From admission, onward)     Start     Ordered   07/30/20 1017  DIET DYS 3 Room service appropriate? Yes; Fluid consistency: Thin  Diet effective now       Question Answer Comment  Room service appropriate? Yes   Fluid consistency: Thin      07/30/20 1016           Brief/Interim Summary: Shawna Curry is an 86 year old woman with history of CAD, history of cardiac stent, A Fib, previous left PCA stroke in March 2021, HLD, HTN, PAD who presented to the ED with shortness of breath. She suffered PEA arrest in the ED and was intubated, admitted to ICU on 8/21. Arrest without known etiology but possibly arrhythmogenic cause. Cardiology consulted. Patient transferred to progressive unit and TRH service on 8/25.  Due to ataxia, she underwent MRI brain which revealed acute right posterior cerebral stroke.  Neurology was consulted.  Discharge Diagnoses:  Principal Problem:   Respiratory failure ([place]) Active Problems:   AKI (acute kidney injury) ([place])   Acute systolic heart failure (HCC)   Debility   Atrial fibrillation (HCC)   Dyslipidemia   Controlled type 2 diabetes mellitus with hyperglycemia (HCC)   Acute on chronic systolic (congestive) heart failure (HCC)   Cerebral thrombosis with cerebral infarction   S/p PEA cardiac arrest secondary to acute hypoxic respiratory failure, possibly due to decompensated heart failure leading to cardiac arrest. -Stable and off vent support    Acute hypoxemic respiratory failure -Resolved, on  room air now    Acute right posterior cerebral stroke -Hx of left PCA stroke in March 2021 at [place] brain: Small acute posterior right cerebral infarct. Motion degraded incomplete exam.  -Plavix and eliquis PTA. Currently on Eliquis -Neurology following -Lipitor   Paroxysmal A Fib -Eliquis -Amiodarone, toprol  -Cardiology following    Acute on chronic systolic CHF  -BNP > 7412, improving now  -Echo EF 35-40%, global hypokinesis of LV, severe MR  -Lasix -Toprol  -Cardiology following    Acute metabolic encephalopathy  -S/p arrest -Seems to be back to her baseline mentation currently    AKI -Renal US: Increased echogenicity of renal parenchyma is noted bilaterally suggesting medical renal disease -Improving, Cr peaked at 2.8 and trended down to 1.44, trend BMP    DM -Novolog SSI    Transaminitis -Likely secondary to cardiac arrest. Improving LFTs    Leukocytosis  -Blood cultures negative -Urine culture negative -Respiratory culture negative  -Resolved -Sepsis ruled out    Elevated TSH -Need repeat outpatient follow up once above acute medical issues resolved      Discharge Instructions  Discharge Instructions     No wound care   Complete by: As directed       Allergies as of 08/02/2020   No Known Allergies      Medication List     STOP taking these medications    clopidogrel 75 MG tablet Commonly known as: PLAVIX   diltiazem 120 MG 24 hr capsule Commonly known as: CARDIZEM CD  ezetimibe 10 MG tablet Commonly known as: ZETIA       TAKE these medications    acetaminophen 650 MG CR tablet Commonly known as: TYLENOL Take 1,300 mg by mouth daily as needed for pain.   alendronate 35 MG tablet Commonly known as: FOSAMAX Take 35 mg by mouth every Monday.   amiodarone 200 MG tablet Commonly known as: PACERONE Take 1 tablet (200 mg total) by mouth 2 (two) times daily.   atorvastatin 40 MG tablet Commonly known as:  LIPITOR Take 1 tablet (40 mg total) by mouth every evening. What changed:  medication strength how much to take   Eliquis 2.5 MG Tabs tablet Generic drug: apixaban Take 2.5 mg by mouth 2 (two) times daily.   folic acid 1 MG tablet Commonly known as: FOLVITE Take 1 mg by mouth daily.   furosemide 20 MG tablet Commonly known as: LASIX Take 40 mg by mouth daily.   gabapentin 100 MG capsule Commonly known as: NEURONTIN Take 200 mg by mouth 3 (three) times daily.   l-methylfolate-B6-B12 3-35-2 MG Tabs tablet Commonly known as: METANX Take 1 tablet by mouth 2 (two) times daily.   latanoprost 0.005 % ophthalmic solution Commonly known as: XALATAN Place 1 drop into both eyes at bedtime.   metFORMIN 500 MG 24 hr tablet Commonly known as: GLUCOPHAGE-XR Take 500 mg by mouth 2 (two) times daily with a meal.   metoprolol succinate 25 MG 24 hr tablet Commonly known as: TOPROL-XL Take 1 tablet (25 mg total) by mouth daily. What changed:  medication strength how much to take        Follow-up Information     Iva Lento, PA-C Follow up.   Specialty: Internal Medicine Contact information: [place] [address] [phone]         Denton Brick., MD Follow up.   Specialty: Cardiology Contact information: [address] [place] [address] [place], NP Follow up.   Specialty: Neurology Contact information: [address] [place] [address] [phone]                No Known Allergies  Consultations: PCCM  Cardiology Neurology    Procedures/Studies: DG Chest 2 View  Result Date: 07/27/2020 CLINICAL DATA:  Shortness of breath for several days. EXAM: CHEST - 2 VIEW COMPARISON:  CT chest and PA and lateral chest 07/25/2020. FINDINGS: Very small bilateral pleural effusions are again seen. There is cardiomegaly. Aortic atherosclerosis. Lungs are emphysematous but  clear. No acute bony abnormality. Remote left sixth rib fracture noted. IMPRESSION: No change in small bilateral pleural effusions and mild basilar atelectasis. Cardiomegaly without edema. Aortic Atherosclerosis (ICD10-I70.0) and Emphysema (ICD10-J43.9). Electronically Signed   By: Inge Rise M.D.   On: 07/27/2020 12:45   DG Chest 2 View  Result Date: 07/25/2020 CLINICAL DATA:  Shortness of breath.  Right chest pain. EXAM: CHEST - 2 VIEW COMPARISON:  07/10/2020 FINDINGS: Enlarged cardiac silhouette. Aortic atherosclerotic calcification. Pulmonary venous hypertension without frank edema. Small effusions layering dependently, larger than were seen 2 weeks ago. Ordinary chronic degenerative changes affect the shoulders. IMPRESSION: Enlarged cardiac silhouette. Bilateral pleural effusions in the dependent pleural space, right larger than left, and slightly larger than 2 weeks ago. Electronically Signed   By: Nelson Chimes M.D.   On: 07/25/2020 12:29   CT HEAD WO CONTRAST  Result Date: 07/28/2020 CLINICAL DATA:  Encephalopathy  EXAM: CT HEAD WITHOUT CONTRAST TECHNIQUE: Contiguous axial images were obtained from the base of the skull through the vertex without intravenous contrast. COMPARISON:  02/18/2020 FINDINGS: Brain: There is no mass, hemorrhage or extra-axial collection. The size and configuration of the ventricles and extra-axial CSF spaces are normal. There is hypoattenuation of the white matter, most commonly indicating chronic small vessel disease. Vascular: No abnormal hyperdensity of the major intracranial arteries or dural venous sinuses. No intracranial atherosclerosis. Skull: The visualized skull base, calvarium and extracranial soft tissues are normal. Sinuses/Orbits: No fluid levels or advanced mucosal thickening of the visualized paranasal sinuses. No mastoid or middle ear effusion. The orbits are normal. IMPRESSION: Chronic small vessel disease without acute intracranial abnormality.  Electronically Signed   By: Ulyses Jarred M.D.   On: 07/28/2020 02:39   CT Chest Wo Contrast  Result Date: 07/25/2020 CLINICAL DATA:  Shortness of breath and weakness. EXAM: CT CHEST WITHOUT CONTRAST TECHNIQUE: Multidetector CT imaging of the chest was performed following the standard protocol without IV contrast. COMPARISON:  Apr 22, 2020 FINDINGS: Cardiovascular: There is marked severity calcification of the aortic arch. There is mild cardiomegaly. No pericardial effusion. Marked severity coronary artery calcification is noted Mediastinum/Nodes: No enlarged mediastinal or axillary lymph nodes. Thyroid gland, trachea, and esophagus demonstrate no significant findings. Lungs/Pleura: There is mild biapical scarring and/or atelectasis. Mild posterior right basilar atelectasis and/or infiltrate is seen. There is a small right pleural effusion. No pneumothorax is identified. Upper Abdomen: No acute abnormality. Musculoskeletal: Chronic second, third, fourth, fifth, 6 and seventh anterior right rib fractures are seen additional chronic anterior third, fourth and fifth left rib fractures are noted. Multilevel degenerative changes seen throughout the thoracic spine. IMPRESSION: 1. Mild posterior right basilar atelectasis and/or infiltrate. 2. Small right pleural effusion. 3. Marked severity coronary artery calcification. 4. Chronic bilateral rib fractures. 5. Aortic atherosclerosis. Aortic Atherosclerosis (ICD10-I70.0). Electronically Signed   By: Virgina Norfolk M.D.   On: 07/25/2020 16:10   MR BRAIN WO CONTRAST  Result Date: 07/30/2020 CLINICAL DATA:  Cardiac arrest. History of atrial fibrillation and stroke. EXAM: MRI HEAD WITHOUT CONTRAST TECHNIQUE: Multiplanar, multiecho pulse sequences of the brain and surrounding structures were obtained without intravenous contrast. COMPARISON:  Head CT 07/28/2020 and MRI 02/19/2020 FINDINGS: The study could not be completed due to the patient's condition. Axial and  coronal diffusion, axial T2* gradient echo, axial FLAIR, and coronal T2 sequences were obtained and are motion degraded including severe motion on the gradient echo sequence. Brain: There is a small acute cortical and subcortical infarct in the posterior right cerebral hemisphere near the junction of the temporal, parietal, and occipital lobes (posterior MCA or border zone). No gross intracranial hemorrhage, mass/mass effect, or extra-axial fluid collection is identified. There is a chronic lacunar infarct at the lateral aspect of the left thalamus. T2 hyperintensities in the cerebral white matter bilaterally are similar to the prior MRI and are nonspecific but compatible with mild-to-moderate chronic small vessel ischemic disease. There is mild cerebral atrophy. A chronic lacunar infarct or cyst in the left subinsular region is unchanged. Mild cerebral atrophy is not considered abnormal for age. Vascular: Limited assessment on this motion degraded, incomplete examination. Skull and upper cervical spine: No gross destructive skull lesion. Sinuses/Orbits: No acute findings. Other: None. IMPRESSION: 1. Motion degraded, incomplete examination. 2. Small acute posterior right cerebral infarct. 3. Mild-to-moderate chronic small vessel ischemic disease. Electronically Signed   By: Logan Bores M.D.   On: 07/30/2020 15:16   US  RENAL  Result Date: 07/29/2020 CLINICAL DATA:  Acute kidney injury. EXAM: RENAL / URINARY TRACT ULTRASOUND COMPLETE COMPARISON:  February 17, 2020. FINDINGS: Right Kidney: Renal measurements: 7.9 x 4.8 x 4.4 cm = volume: 87 mL. Increased echogenicity of renal parenchyma is noted. 1.1 cm simple cyst is noted in lower pole. No mass or hydronephrosis visualized. Left Kidney: Renal measurements: 9.3 x 4.4 x 4.2 cm = volume: 88 mL. Increased echogenicity of renal parenchyma is noted. No mass or hydronephrosis visualized. Bladder: Decompressed secondary to Foley catheter. Other: Minimal ascites is noted in  the right upper quadrant. IMPRESSION: Increased echogenicity of renal parenchyma is noted bilaterally suggesting medical renal disease. Mild right renal atrophy is noted. No hydronephrosis or renal obstruction is noted. Electronically Signed   By: Marijo Conception M.D.   On: 07/29/2020 12:29   DG Chest Port 1 View  Result Date: 07/31/2020 CLINICAL DATA:  Respiratory failure. EXAM: PORTABLE CHEST 1 VIEW COMPARISON:  July 29, 2020. FINDINGS: Stable cardiomegaly. No pneumothorax is noted. Endotracheal and nasogastric tubes have been removed. Mild bibasilar subsegmental atelectasis is noted. Small right pleural effusion is noted. Degenerative changes are seen involving both glenohumeral joints. IMPRESSION: Mild bibasilar subsegmental atelectasis. Small right pleural effusion. Endotracheal and nasogastric tubes have been removed. Aortic Atherosclerosis (ICD10-I70.0). Electronically Signed   By: Marijo Conception M.D.   On: 07/31/2020 08:27   DG Chest Port 1 View  Result Date: 07/29/2020 CLINICAL DATA:  Hypoxia EXAM: PORTABLE CHEST 1 VIEW COMPARISON:  July 27, 2020 FINDINGS: Endotracheal tube tip is 3.3 cm above the carina. Nasogastric tube tip and side port are below the diaphragm. No pneumothorax. There is airspace consolidation in the left lower lobe with minimal left pleural effusion. There is mild atelectatic change in the left upper lobe. Lungs otherwise are clear. There is cardiomegaly with pulmonary vascularity normal. No adenopathy. There is aortic atherosclerosis. Bones are osteoporotic with arthropathy in each shoulder. IMPRESSION: Tube positions as described without pneumothorax. Airspace opacity left lower lung region, likely representing pneumonia or aspiration. Minimal left pleural effusion. Atelectatic change left upper lobe. A small amount of pneumonia in the left upper lobe is possible. Stable cardiomegaly. Aortic Atherosclerosis (ICD10-I70.0). Electronically Signed   By: Lowella Grip III  M.D.   On: 07/29/2020 11:07   DG CHEST PORT 1 VIEW  Result Date: 07/27/2020 CLINICAL DATA:  Intubated, history of cardiac event EXAM: PORTABLE CHEST 1 VIEW COMPARISON:  07/27/2020 at 6:09 p.m. FINDINGS: Supine frontal view of the chest was obtained at 9:23 p.m. Endotracheal tube overlies tracheal air column tip at level of thoracic inlet. Enteric catheter passes below diaphragm tip excluded by collimation. Cardiac silhouette is enlarged. There are developing bibasilar veiling opacities, right greater than left. Stable central vascular congestion. No pneumothorax. IMPRESSION: 1. Support devices as above. 2. Bibasilar lung consolidation and effusions, right greater than left. This has progressed since prior study. 3. Stable central vascular congestion. Electronically Signed   By: Randa Ngo M.D.   On: 07/27/2020 21:32   DG Chest Portable 1 View  Result Date: 07/27/2020 CLINICAL DATA:  Intubated after cardiac event, unresponsive EXAM: PORTABLE CHEST 1 VIEW COMPARISON:  07/27/2020 at 12:20 p.m. FINDINGS: Single frontal view of the chest demonstrates endotracheal tube overlying tracheal air column tip just below thoracic inlet. Enteric catheter passes below diaphragm tip excluded by collimation. External defibrillator pads overlie the cardiac silhouette, which is enlarged. Trace left pleural effusion is unchanged. There is central vascular congestion. No pneumothorax. No acute  bony abnormalities. IMPRESSION: 1. Support devices as above. 2. Central vascular congestion, with stable trace left pleural effusion. Electronically Signed   By: Randa Ngo M.D.   On: 07/27/2020 18:37   DG Foot Complete Right  Result Date: 07/27/2020 CLINICAL DATA:  Plantar ulceration, right foot pain, decreased pulses EXAM: RIGHT FOOT COMPLETE - 3+ VIEW COMPARISON:  02/26/2020 FINDINGS: Frontal, oblique, and lateral views of the right foot are obtained. Bones remain osteopenic. Prior resection or resorption of the fifth middle  phalanx unchanged. There are no acute or destructive bony lesions. Specifically, no radiographic evidence of osteomyelitis. Prominent calcaneal spurs are stable. Diffuse vascular calcifications are noted. IMPRESSION: 1. No acute or destructive bony lesion. Electronically Signed   By: Randa Ngo M.D.   On: 07/27/2020 15:13   EEG adult  Result Date: 07/28/2020 Greta Doom, MD     07/28/2020 12:52 PM History: 86 year old female status post cardiac arrest and now intubated. Sedation: None Technique: This is a 21 channel routine scalp EEG performed at the bedside with bipolar and monopolar montages arranged in accordance to the international 10/20 system of electrode placement. One channel was dedicated to EKG recording. Background: The background consists predominantly of generalized irregular delta and theta range activities.  There is a posterior dominant rhythm which attenuates with eye opening but is poorly sustained, with a frequency of 9 Hz.  No epileptiform discharges or seizures were seen on the study. Photic stimulation: Physiologic driving is not performed EEG Abnormalities: 1) generalized irregular slow activity Clinical Interpretation: This  EEG is consistent with a nonspecific generalized cerebral dysfunction (encephalopathy).  There was no seizure or seizure predisposition recorded on this study. Please note that lack of epileptiform activity on EEG does not preclude the possibility of epilepsy. Roland Rack, MD [facility] [phone] If 7pm- 7am, please page neurology on call as listed in [place].   ECHOCARDIOGRAM COMPLETE  Result Date: 07/28/2020    ECHOCARDIOGRAM REPORT   Patient Name:   TORRIN Malson Date of Exam: 07/28/2020 Medical Rec #:  [id number]     Height:       68.0 in Accession #:    [id number]    Weight:       114.6 lb Date of Birth:  12/01/1933     BSA:          1.613 m Patient Age:    9 years      BP:           133/117 mmHg Patient Gender: F              HR:           106 bpm. Exam Location:  Inpatient Procedure: 2D Echo, Cardiac Doppler and Color Doppler Indications:    Cardiac arrest  History:        Patient has no prior history of Echocardiogram examinations.                 CAD, Arrythmias:Atrial Fibrillation; Risk Factors:Hypertension                 and Dyslipidemia. Sepsis. Respiratory failure. HFpEF. PVD.  Sonographer:    Clayton Lefort RDCS (AE) Referring Phys: [id number] [place]  1. LVEF ~ 40% with diffuse hypokinesis, moderate RV systolic dysfunction. Severe mitral regurgitation, RVSP 56 mmHg.  2. Left ventricular ejection fraction, by estimation, is 40 to 45%. The left ventricle has mildly decreased function. The left ventricle demonstrates global hypokinesis. There is moderate concentric left  ventricular hypertrophy. Left ventricular diastolic function could not be evaluated.  3. Right ventricular systolic function is moderately reduced. The right ventricular size is moderately enlarged. There is moderately elevated pulmonary artery systolic pressure. The estimated right ventricular systolic pressure is 81.1 mmHg.  4. Left atrial size was severely dilated.  5. Right atrial size was moderately dilated.  6. Large left pleural effusion with lung atelectasis.  7. The mitral valve is normal in structure. Severe mitral valve regurgitation. No evidence of mitral stenosis.  8. Tricuspid valve regurgitation is moderate.  9. The aortic valve is normal in structure. Aortic valve regurgitation is trivial. Mild to moderate aortic valve sclerosis/calcification is present, without any evidence of aortic stenosis. 10. The inferior vena cava is normal in size with greater than 50% respiratory variability, suggesting right atrial pressure of 3 mmHg. FINDINGS  Left Ventricle: Left ventricular ejection fraction, by estimation, is 40 to 45%. The left ventricle has mildly decreased function. The left ventricle demonstrates global hypokinesis. The left  ventricular internal cavity size was normal in size. There is  moderate concentric left ventricular hypertrophy. Left ventricular diastolic function could not be evaluated due to atrial fibrillation. Left ventricular diastolic function could not be evaluated. Right Ventricle: The right ventricular size is moderately enlarged. No increase in right ventricular wall thickness. Right ventricular systolic function is moderately reduced. There is moderately elevated pulmonary artery systolic pressure. The tricuspid  regurgitant velocity is 3.20 m/s, and with an assumed right atrial pressure of 15 mmHg, the estimated right ventricular systolic pressure is 91.4 mmHg. Left Atrium: Left atrial size was severely dilated. Right Atrium: Right atrial size was moderately dilated. Pericardium: Trivial pericardial effusion is present. Mitral Valve: The mitral valve is normal in structure. Normal mobility of the mitral valve leaflets. Severe mitral valve regurgitation, with posteriorly-directed jet. No evidence of mitral valve stenosis. Tricuspid Valve: The tricuspid valve is normal in structure. Tricuspid valve regurgitation is moderate . No evidence of tricuspid stenosis. Aortic Valve: The aortic valve is normal in structure. Aortic valve regurgitation is trivial. Mild to moderate aortic valve sclerosis/calcification is present, without any evidence of aortic stenosis. Aortic valve mean gradient measures 3.0 mmHg. Aortic valve peak gradient measures 6.0 mmHg. Aortic valve area, by VTI measures 1.36 cm. Pulmonic Valve: The pulmonic valve was normal in structure. Pulmonic valve regurgitation is not visualized. No evidence of pulmonic stenosis. Aorta: The aortic root is normal in size and structure. Venous: The inferior vena cava is normal in size with greater than 50% respiratory variability, suggesting right atrial pressure of 3 mmHg. IAS/Shunts: No atrial level shunt detected by color flow Doppler. Additional Comments: There is a  large pleural effusion in the left lateral region.  LEFT VENTRICLE PLAX 2D LVIDd:         4.40 cm LVIDs:         3.40 cm LV PW:         1.20 cm LV IVS:        1.20 cm LVOT diam:     2.00 cm LV SV:         21 LV SV Index:   13 LVOT Area:     3.14 cm  LV Volumes (MOD) LV vol d, MOD A2C: 78.0 ml LV vol d, MOD A4C: 75.1 ml LV vol s, MOD A2C: 49.1 ml LV vol s, MOD A4C: 45.5 ml LV SV MOD A2C:     28.9 ml LV SV MOD A4C:     75.1 ml LV  SV MOD BP:      29.7 ml RIGHT VENTRICLE          IVC RV Basal diam:  3.70 cm  IVC diam: 2.20 cm RV Mid diam:    2.70 cm TAPSE (M-mode): 1.3 cm LEFT ATRIUM              Index       RIGHT ATRIUM           Index LA diam:        4.50 cm  2.79 cm/m  RA Area:     24.70 cm LA Vol (A2C):   79.6 ml  49.34 ml/m RA Volume:   80.50 ml  49.90 ml/m LA Vol (A4C):   112.0 ml 69.42 ml/m LA Biplane Vol: 103.0 ml 63.85 ml/m  AORTIC VALVE AV Area (Vmax):    1.39 cm AV Area (Vmean):   1.33 cm AV Area (VTI):     1.36 cm AV Vmax:           122.40 cm/s AV Vmean:          82.200 cm/s AV VTI:            0.153 m AV Peak Grad:      6.0 mmHg AV Mean Grad:      3.0 mmHg LVOT Vmax:         54.32 cm/s LVOT Vmean:        34.720 cm/s LVOT VTI:          0.066 m LVOT/AV VTI ratio: 0.43  AORTA Ao Root diam: 2.90 cm Ao Asc diam:  3.20 cm MR Peak grad:    119.2 mmHg  TRICUSPID VALVE MR Mean grad:    81.0 mmHg   TR Peak grad:   41.0 mmHg MR Vmax:         546.00 cm/s TR Vmax:        320.00 cm/s MR Vmean:        428.0 cm/s MR PISA:         3.08 cm    SHUNTS MR PISA Eff ROA: 22 mm      Systemic VTI:  0.07 m MR PISA Radius:  0.70 cm     Systemic Diam: 2.00 cm Ena Dawley MD Electronically signed by Ena Dawley MD Signature Date/Time: 07/28/2020/11:14:35 AM    Final    VAS US CAROTID  Result Date: 08/01/2020 Carotid Arterial Duplex Study Indications:       CVA. Comparison Study:  No prior study Performing Technologist: Maudry Mayhew MHA, RDMS, RVT, RDCS  Examination Guidelines: A complete evaluation includes  B-mode imaging, spectral Doppler, color Doppler, and power Doppler as needed of all accessible portions of each vessel. Bilateral testing is considered an integral part of a complete examination. Limited examinations for reoccurring indications may be performed as noted.  Right Carotid Findings: +----------+--------+--------+--------+--------------------------+--------+           PSV cm/sEDV cm/sStenosisPlaque Description        Comments +----------+--------+--------+--------+--------------------------+--------+ CCA Prox  49      8                                                  +----------+--------+--------+--------+--------------------------+--------+ CCA Distal41      7               smooth and  heterogenous            +----------+--------+--------+--------+--------------------------+--------+ ICA Prox  27      9               smooth and heterogenous            +----------+--------+--------+--------+--------------------------+--------+ ICA Distal68      15                                                 +----------+--------+--------+--------+--------------------------+--------+ ECA       69      6               heterogenous and irregular         +----------+--------+--------+--------+--------------------------+--------+ +----------+--------+-------+----------------+-------------------+           PSV cm/sEDV cmsDescribe        Arm Pressure (mmHG) +----------+--------+-------+----------------+-------------------+ [id number]             Multiphasic, WNL                    +----------+--------+-------+----------------+-------------------+ +---------+--------+--+--------+-+---------+ VertebralPSV cm/s31EDV cm/s7Antegrade +---------+--------+--+--------+-+---------+  Left Carotid Findings: +----------+-------+-------+--------+---------------------------------+--------+           PSV    EDV    StenosisPlaque Description               Comments            cm/s   cm/s                                                     +----------+-------+-------+--------+---------------------------------+--------+ CCA Prox  68     10                                                       +----------+-------+-------+--------+---------------------------------+--------+ CCA Distal63     13             smooth and heterogenous                   +----------+-------+-------+--------+---------------------------------+--------+ ICA Prox  177    51             heterogenous, irregular and                                               calcific                                  +----------+-------+-------+--------+---------------------------------+--------+ ICA Distal72     22                                                       +----------+-------+-------+--------+---------------------------------+--------+ ECA  67     10             calcific                                  +----------+-------+-------+--------+---------------------------------+--------+ +----------+--------+--------+----------------+-------------------+           PSV cm/sEDV cm/sDescribe        Arm Pressure (mmHG) +----------+--------+--------+----------------+-------------------+ [id number]             Multiphasic, WNL                    +----------+--------+--------+----------------+-------------------+ +---------+--------+--+--------+-+---------+ VertebralPSV cm/s48EDV cm/s7Antegrade +---------+--------+--+--------+-+---------+   Summary: Right Carotid: Velocities in the right ICA are consistent with a 1-39% stenosis. Left Carotid: Velocities in the left ICA are consistent with a 40-59% stenosis. Vertebrals:  Bilateral vertebral arteries demonstrate antegrade flow. Subclavians: Normal flow hemodynamics were seen in bilateral subclavian              arteries. *See table(s) above for measurements and observations.  Electronically signed by Monica Martinez MD on 08/01/2020 at 3:29:12 PM.    Final    ECHOCARDIOGRAM LIMITED  Result Date: 07/31/2020    ECHOCARDIOGRAM LIMITED REPORT   Patient Name:   Shawna Curry Date of Exam: 07/31/2020 Medical Rec #:  [id number]     Height:       68.0 in Accession #:    [id number]    Weight:       112.7 lb Date of Birth:  1933/02/14     BSA:          1.601 m Patient Age:    87 years      BP:           106/67 mmHg Patient Gender: F             HR:           50 bpm. Exam Location:  Inpatient Procedure: Limited Echo, Cardiac Doppler and Color Doppler Indications:    Mitral valve insufficiency  History:        Patient has prior history of Echocardiogram examinations, most                 recent 07/28/2020. Risk Factors:Former Smoker. Sepsis.                 Respiratory failure. Acute systolic heart failure.  Sonographer:    Clayton Lefort RDCS (AE) Referring Phys: [id number] [place]  1. The PMVL is restricted in systole (IIIB pathology). The regurgitation jet is central (A2-P2) and directed centrally/posteriorly. The mitral valve is degenerative. Severe mitral valve regurgitation. No evidence of mitral stenosis.  2. Left ventricular ejection fraction, by estimation, is 35 to 40%. The left ventricle has moderately decreased function. The left ventricle demonstrates global hypokinesis. There is mild concentric left ventricular hypertrophy. There is the interventricular septum is flattened in systole and diastole, consistent with right ventricular pressure and volume overload.  3. Right ventricular systolic function is moderately reduced. The right ventricular size is moderately enlarged. There is moderately elevated pulmonary artery systolic pressure. The estimated right ventricular systolic pressure is 73.5 mmHg.  4. Left atrial size was severely dilated.  5. Right atrial size was severely dilated.  6. Tricuspid valve regurgitation is moderate to severe.  7. The aortic valve is tricuspid. Aortic valve regurgitation is not  visualized. Mild aortic valve stenosis.  8. The inferior vena  cava is dilated in size with <50% respiratory variability, suggesting right atrial pressure of 15 mmHg. Comparison(s): No significant change from prior study. MR remains severe. Moderate to severe TR. RVSP improved. FINDINGS  Left Ventricle: Left ventricular ejection fraction, by estimation, is 35 to 40%. The left ventricle has moderately decreased function. The left ventricle demonstrates global hypokinesis. The left ventricular internal cavity size was normal in size. There is mild concentric left ventricular hypertrophy. The interventricular septum is flattened in systole and diastole, consistent with right ventricular pressure and volume overload. Right Ventricle: The right ventricular size is moderately enlarged. No increase in right ventricular wall thickness. Right ventricular systolic function is moderately reduced. There is moderately elevated pulmonary artery systolic pressure. The tricuspid  regurgitant velocity is 2.79 m/s, and with an assumed right atrial pressure of 15 mmHg, the estimated right ventricular systolic pressure is 92.1 mmHg. Left Atrium: Left atrial size was severely dilated. Right Atrium: Right atrial size was severely dilated. Pericardium: Trivial pericardial effusion is present. Mitral Valve: The PMVL is restricted in systole (IIIB pathology). The regurgitation jet is central (A2-P2) and directed centrally/posteriorly. The mitral valve is degenerative in appearance. There is mild calcification of the anterior and posterior mitral valve leaflet(s). Severe mitral valve regurgitation. No evidence of mitral valve stenosis. Tricuspid Valve: The tricuspid valve is grossly normal. Tricuspid valve regurgitation is moderate to severe. Aortic Valve: The aortic valve is tricuspid. . There is moderate thickening and moderate calcification of the aortic valve. Aortic valve regurgitation is not visualized. Mild aortic stenosis is present.  There is moderate thickening of the aortic valve. There is moderate calcification of the aortic valve. Pulmonic Valve: The pulmonic valve was grossly normal. Pulmonic valve regurgitation is trivial. No evidence of pulmonic stenosis. Venous: The inferior vena cava is dilated in size with less than 50% respiratory variability, suggesting right atrial pressure of 15 mmHg. Additional Comments: There is a small pleural effusion in the left lateral region. LEFT VENTRICLE PLAX 2D LVIDd:         4.60 cm LVIDs:         3.10 cm LV PW:         1.10 cm LV IVS:        1.40 cm LVOT diam:     2.00 cm LVOT Area:     3.14 cm  LV Volumes (MOD) LV vol d, MOD A2C: 62.7 ml LV vol d, MOD A4C: 83.7 ml LV vol s, MOD A2C: 33.8 ml LV vol s, MOD A4C: 45.1 ml LV SV MOD A2C:     28.9 ml LV SV MOD A4C:     83.7 ml LV SV MOD BP:      32.1 ml IVC IVC diam: 2.30 cm LEFT ATRIUM         Index LA diam:    4.00 cm 2.50 cm/m   AORTA Ao Root diam: 3.00 cm MR Peak grad:    88.4 mmHg   TRICUSPID VALVE MR Mean grad:    51.0 mmHg   TR Peak grad:   31.1 mmHg MR Vmax:         470.00 cm/s TR Vmax:        279.00 cm/s MR Vmean:        329.0 cm/s MR PISA:         4.02 cm    SHUNTS MR PISA Eff ROA: 34 mm      Systemic Diam: 2.00 cm MR PISA Radius:  0.80 cm Eleonore Chiquito MD  Electronically signed by Eleonore Chiquito MD Signature Date/Time: 07/31/2020/11:26:10 AM    Final        Discharge Exam: Vitals:   08/02/20 0828 08/02/20 1153  BP: (!) 146/69 117/61  Pulse: 60 60  Resp: 16 16  Temp: 98.1 F (36.7 C) 97.9 F (36.6 C)  SpO2: 93% 97%    General: Pt is alert, awake, not in acute distress Cardiovascular: RRR, S1/S2 +, no edema Respiratory: CTA bilaterally, no wheezing, no rhonchi, no respiratory distress, no conversational dyspnea  Abdominal: Soft, NT, ND, bowel sounds + Extremities: no edema, no cyanosis Psych: Stable   The results of significant diagnostics from this hospitalization (including imaging, microbiology, ancillary and laboratory)  are listed below for reference.     Microbiology: Recent Results (from the past 240 hour(s))  SARS Coronavirus 2 by RT PCR (hospital order, performed in [facility] hospital lab) Nasopharyngeal Nasopharyngeal Swab     Status: None   Collection Time: 07/25/20  2:21 PM   Specimen: Nasopharyngeal Swab  Result Value Ref Range Status   SARS Coronavirus 2 NEGATIVE NEGATIVE Final    Comment: (NOTE) SARS-CoV-2 target nucleic acids are NOT DETECTED.  The SARS-CoV-2 RNA is generally detectable in upper and lower respiratory specimens during the acute phase of infection. The lowest concentration of SARS-CoV-2 viral copies this assay can detect is 250 copies / mL. A negative result does not preclude SARS-CoV-2 infection and should not be used as the sole basis for treatment or other patient management decisions.  A negative result may occur with improper specimen collection / handling, submission of specimen other than nasopharyngeal swab, presence of viral mutation(s) within the areas targeted by this assay, and inadequate number of viral copies (<250 copies / mL). A negative result must be combined with clinical observations, patient history, and epidemiological information.  Fact Sheet for Patients:   StrictlyIdeas.no  Fact Sheet for Healthcare Providers: BankingDealers.co.za  This test is not yet approved or  cleared by the [place] FDA and has been authorized for detection and/or diagnosis of SARS-CoV-2 by FDA under an Emergency Use Authorization (EUA).  This EUA will remain in effect (meaning this test can be used) for the duration of the COVID-19 declaration under Section 564(b)(1) of the Act, 21 U.S.C. section 360bbb-3(b)(1), unless the authorization is terminated or revoked sooner.  Performed at [facility], [address] [place], [place] [address]   Blood culture (routine x 2)     Status: None   Collection Time:  07/27/20 12:45 PM   Specimen: BLOOD RIGHT FOREARM  Result Value Ref Range Status   Specimen Description   Final    BLOOD RIGHT FOREARM Performed at [facility], [address] [place], [place] [address]    Special Requests   Final    BOTTLES DRAWN AEROBIC AND ANAEROBIC Blood Culture adequate volume Performed at [facility], [address] [place], [place] [address]    Culture   Final    NO GROWTH 5 DAYS Performed at [facility], [place] [address] [place], [place] [address]    Report Status 08/01/2020 FINAL  Final  SARS Coronavirus 2 by RT PCR (hospital order, performed in [facility] hospital lab) Nasopharyngeal Nasopharyngeal Swab     Status: None   Collection Time: 07/27/20 12:50 PM   Specimen: Nasopharyngeal Swab  Result Value Ref Range Status   SARS Coronavirus 2 NEGATIVE NEGATIVE Final    Comment: (NOTE) SARS-CoV-2  target nucleic acids are NOT DETECTED.  The SARS-CoV-2 RNA is generally detectable in upper and lower respiratory specimens during the acute phase of infection. The lowest concentration of SARS-CoV-2 viral copies this assay can detect is 250 copies / mL. A negative result does not preclude SARS-CoV-2 infection and should not be used as the sole basis for treatment or other patient management decisions.  A negative result may occur with improper specimen collection / handling, submission of specimen other than nasopharyngeal swab, presence of viral mutation(s) within the areas targeted by this assay, and inadequate number of viral copies (<250 copies / mL). A negative result must be combined with clinical observations, patient history, and epidemiological information.  Fact Sheet for Patients:   StrictlyIdeas.no  Fact Sheet for Healthcare Providers: BankingDealers.co.za  This test is not yet approved or  cleared by the [place] FDA and has been authorized for detection and/or  diagnosis of SARS-CoV-2 by FDA under an Emergency Use Authorization (EUA).  This EUA will remain in effect (meaning this test can be used) for the duration of the COVID-19 declaration under Section 564(b)(1) of the Act, 21 U.S.C. section 360bbb-3(b)(1), unless the authorization is terminated or revoked sooner.  Performed at [facility], [address] [place], [place] [address]   Blood culture (routine x 2)     Status: None   Collection Time: 07/27/20  1:00 PM   Specimen: BLOOD RIGHT FOREARM  Result Value Ref Range Status   Specimen Description   Final    BLOOD RIGHT FOREARM Performed at [facility], [address] [place], [place] [address]    Special Requests   Final    BOTTLES DRAWN AEROBIC ONLY Blood Culture results may not be optimal due to an inadequate volume of blood received in culture bottles Performed at [facility], [address] [place], [place] [address]    Culture   Final    NO GROWTH 5 DAYS Performed at [facility], [place] [address] [place], [place] [address]    Report Status 08/01/2020 FINAL  Final  MRSA PCR Screening     Status: None   Collection Time: 07/27/20  7:53 PM   Specimen: Nasal Mucosa; Nasopharyngeal  Result Value Ref Range Status   MRSA by PCR NEGATIVE NEGATIVE Final    Comment:        The GeneXpert MRSA Assay (FDA approved for NASAL specimens only), is one component of a comprehensive MRSA colonization surveillance program. It is not intended to diagnose MRSA infection nor to guide or monitor treatment for MRSA infections. Performed at [facility], [place] [address], [place], [place] [address]   Urine Culture     Status: None   Collection Time: 07/29/20 11:11 AM   Specimen: Urine, Catheterized  Result Value Ref Range Status   Specimen Description URINE, CATHETERIZED  Final   Special Requests NONE  Final   Culture   Final    NO GROWTH Performed at [facility], [address], [place], [place] [address]    Report Status 07/30/2020 FINAL  Final  Culture, respiratory (non-expectorated)     Status: None (Preliminary result)   Collection Time: 07/29/20 11:38 AM   Specimen: Tracheal Aspirate; Respiratory  Result Value Ref Range Status   Specimen Description TRACHEAL ASPIRATE  Final   Special Requests NONE  Final   Gram Stain   Final    ABUNDANT WBC PRESENT,BOTH PMN  AND MONONUCLEAR NO ORGANISMS SEEN    Culture   Final    FEW STENOTROPHOMONAS MALTOPHILIA CULTURE REINCUBATED FOR BETTER GROWTH Performed at [facility], [place] [address], [place], [place] [address]    Report Status PENDING  Incomplete     Labs: BNP (last 3 results) Recent Labs    07/30/20 0424 07/31/20 0627 08/01/20 0523  BNP >4,500.0* 2,982.0* 0,240.9*   Basic Metabolic Panel: Recent Labs  Lab 07/28/20 7353 07/28/20 0653 07/28/20 1044 07/28/20 1517 07/29/20 0236 07/29/20 1351 07/29/20 2215 07/30/20 0846 07/31/20 0627 08/01/20 0523 08/02/20 0410  NA 143   < >  --   --   --  142  --  141 140 139 142  K 3.7   < >  --   --   --  4.7  --  3.7 3.7 3.8 3.9  CL 103   < >  --   --   --  105  --  105 106 104 106  CO2 20*   < >  --   --   --  18*  --  21* 22 25 25   GLUCOSE 216*   < >  --   --   --  196*  --  116* 108* 131* 117*  BUN 45*   < >  --   --   --  61*  --  63* 57* 53* 48*  CREATININE 2.04*   < >  --   --   --  2.80*  --  2.31* 1.91* 1.75* 1.44*  CALCIUM 8.5*   < >  --   --   --  8.5*  --  8.6* 8.6* 8.6* 8.6*  MG 2.3  --  2.5* 2.8* 2.8*  --  2.3  --   --   --   --   PHOS 3.8  --  4.1 6.0* 7.2*  --  3.9  --   --   --   --    < > = values in this interval not displayed.   Liver Function Tests: Recent Labs  Lab 07/27/20 1250 07/28/20 0110 07/30/20 0846 07/31/20 0627 08/02/20 0410  AST 107* 184* 107* 75* 41  ALT 69* 120* 105* 85* 59*  ALKPHOS 145* 130* 125 135* 142*  BILITOT 1.1 1.4* 1.2 0.7 0.5  PROT 6.4* 5.4* 5.5* 5.1* 4.9*  ALBUMIN 3.9 3.1* 3.3* 3.0* 2.6*    Recent Labs  Lab 07/27/20 1250  LIPASE 43   No results for input(s): AMMONIA in the last 168 hours. CBC: Recent Labs  Lab 07/27/20 1250 07/27/20 1811 07/29/20 0236 07/29/20 1049 07/30/20 0424 07/31/20 0627 08/01/20 0523  WBC 6.1   < > 24.5* 23.5* 18.6* 11.3* 8.9  NEUTROABS 4.8  --   --   --   --   --   --   HGB 10.3*   < > 10.2* 9.4* 9.8* 9.9* 9.1*  HCT 32.8*   < > 33.6* 29.9* 29.5* 30.3* 28.0*  MCV 89.1   < > 92.6 88.7 85.8 86.3 84.8  PLT 221   < > 209 198 190 180 150   < > = values in this interval not displayed.   Cardiac Enzymes: Recent Labs  Lab 07/28/20 1044  CKTOTAL 54   BNP: Invalid input(s): POCBNP CBG: Recent Labs  Lab 08/01/20 0405 08/01/20 0743 08/01/20 1129 08/01/20 1627 08/02/20 0803  GLUCAP 106* 106* 164* 152* 84   D-Dimer No results for input(s): DDIMER in the  last 72 hours. Hgb A1c No results for input(s): HGBA1C in the last 72 hours. Lipid Profile Recent Labs    08/02/20 0410  CHOL 114  HDL 41  LDLCALC 61  TRIG 58  CHOLHDL 2.8   Thyroid function studies No results for input(s): TSH, T4TOTAL, T3FREE, THYROIDAB in the last 72 hours.  Invalid input(s): FREET3 Anemia work up No results for input(s): VITAMINB12, FOLATE, FERRITIN, TIBC, IRON, RETICCTPCT in the last 72 hours. Urinalysis    Component Value Date/Time   COLORURINE YELLOW 07/27/2020 2318   APPEARANCEUR HAZY (A) 07/27/2020 2318   LABSPEC 1.011 07/27/2020 2318   PHURINE 6.0 07/27/2020 2318   GLUCOSEU 50 (A) 07/27/2020 2318   HGBUR LARGE (A) 07/27/2020 2318   [place] NEGATIVE 07/27/2020 2318   Ona 07/27/2020 2318   PROTEINUR 100 (A) 07/27/2020 2318   NITRITE NEGATIVE 07/27/2020 2318   LEUKOCYTESUR NEGATIVE 07/27/2020 2318   Sepsis Labs Invalid input(s): PROCALCITONIN,  WBC,  LACTICIDVEN Microbiology Recent Results (from the past 240 hour(s))  SARS Coronavirus 2 by RT PCR (hospital order, performed in [place] hospital lab) Nasopharyngeal  Nasopharyngeal Swab     Status: None   Collection Time: 07/25/20  2:21 PM   Specimen: Nasopharyngeal Swab  Result Value Ref Range Status   SARS Coronavirus 2 NEGATIVE NEGATIVE Final    Comment: (NOTE) SARS-CoV-2 target nucleic acids are NOT DETECTED.  The SARS-CoV-2 RNA is generally detectable in upper and lower respiratory specimens during the acute phase of infection. The lowest concentration of SARS-CoV-2 viral copies this assay can detect is 250 copies / mL. A negative result does not preclude SARS-CoV-2 infection and should not be used as the sole basis for treatment or other patient management decisions.  A negative result may occur with improper specimen collection / handling, submission of specimen other than nasopharyngeal swab, presence of viral mutation(s) within the areas targeted by this assay, and inadequate number of viral copies (<250 copies / mL). A negative result must be combined with clinical observations, patient history, and epidemiological information.  Fact Sheet for Patients:   StrictlyIdeas.no  Fact Sheet for Healthcare Providers: BankingDealers.co.za  This test is not yet approved or  cleared by the [place] FDA and has been authorized for detection and/or diagnosis of SARS-CoV-2 by FDA under an Emergency Use Authorization (EUA).  This EUA will remain in effect (meaning this test can be used) for the duration of the COVID-19 declaration under Section 564(b)(1) of the Act, 21 U.S.C. section 360bbb-3(b)(1), unless the authorization is terminated or revoked sooner.  Performed at [facility], [address] [place], [place] [address]   Blood culture (routine x 2)     Status: None   Collection Time: 07/27/20 12:45 PM   Specimen: BLOOD RIGHT FOREARM  Result Value Ref Range Status   Specimen Description   Final    BLOOD RIGHT FOREARM Performed at [facility], [address] [place], [place] [address]    Special Requests   Final    BOTTLES DRAWN AEROBIC AND ANAEROBIC Blood Culture adequate volume Performed at [facility], [address] [place], [place] [address]    Culture   Final    NO GROWTH 5 DAYS Performed at [facility], [place] [address] [place], [place] [address]    Report Status 08/01/2020 FINAL  Final  SARS Coronavirus 2 by RT PCR (hospital order, performed in [facility] hospital lab) Nasopharyngeal Nasopharyngeal  Swab     Status: None   Collection Time: 07/27/20 12:50 PM   Specimen: Nasopharyngeal Swab  Result Value Ref Range Status   SARS Coronavirus 2 NEGATIVE NEGATIVE Final    Comment: (NOTE) SARS-CoV-2 target nucleic acids are NOT DETECTED.  The SARS-CoV-2 RNA is generally detectable in upper and lower respiratory specimens during the acute phase of infection. The lowest concentration of SARS-CoV-2 viral copies this assay can detect is 250 copies / mL. A negative result does not preclude SARS-CoV-2 infection and should not be used as the sole basis for treatment or other patient management decisions.  A negative result may occur with improper specimen collection / handling, submission of specimen other than nasopharyngeal swab, presence of viral mutation(s) within the areas targeted by this assay, and inadequate number of viral copies (<250 copies / mL). A negative result must be combined with clinical observations, patient history, and epidemiological information.  Fact Sheet for Patients:   StrictlyIdeas.no  Fact Sheet for Healthcare Providers: BankingDealers.co.za  This test is not yet approved or  cleared by the [place] FDA and has been authorized for detection and/or diagnosis of SARS-CoV-2 by FDA under an Emergency Use Authorization (EUA).  This EUA will remain in effect (meaning this test can be used) for the duration of the COVID-19 declaration under  Section 564(b)(1) of the Act, 21 U.S.C. section 360bbb-3(b)(1), unless the authorization is terminated or revoked sooner.  Performed at [facility], [address] [place], [place] [address]   Blood culture (routine x 2)     Status: None   Collection Time: 07/27/20  1:00 PM   Specimen: BLOOD RIGHT FOREARM  Result Value Ref Range Status   Specimen Description   Final    BLOOD RIGHT FOREARM Performed at [facility], [address] [place], [place] [address]    Special Requests   Final    BOTTLES DRAWN AEROBIC ONLY Blood Culture results may not be optimal due to an inadequate volume of blood received in culture bottles Performed at [facility], [address] [place], [place] [address]    Culture   Final    NO GROWTH 5 DAYS Performed at [facility], [place] [address] [place], [place] [address]    Report Status 08/01/2020 FINAL  Final  MRSA PCR Screening     Status: None   Collection Time: 07/27/20  7:53 PM   Specimen: Nasal Mucosa; Nasopharyngeal  Result Value Ref Range Status   MRSA by PCR NEGATIVE NEGATIVE Final    Comment:        The GeneXpert MRSA Assay (FDA approved for NASAL specimens only), is one component of a comprehensive MRSA colonization surveillance program. It is not intended to diagnose MRSA infection nor to guide or monitor treatment for MRSA infections. Performed at [facility], [place] [address], [place], [place] [address]   Urine Culture     Status: None   Collection Time: 07/29/20 11:11 AM   Specimen: Urine, Catheterized  Result Value Ref Range Status   Specimen Description URINE, CATHETERIZED  Final   Special Requests NONE  Final   Culture   Final    NO GROWTH Performed at [facility], [address] [place], [place] [address]    Report Status 07/30/2020 FINAL  Final  Culture, respiratory (non-expectorated)     Status: None (Preliminary result)   Collection Time: 07/29/20 11:38 AM  Specimen: Tracheal Aspirate; Respiratory  Result Value Ref Range Status   Specimen Description TRACHEAL ASPIRATE  Final   Special Requests NONE  Final   Gram Stain   Final    ABUNDANT WBC PRESENT,BOTH PMN AND MONONUCLEAR NO ORGANISMS SEEN    Culture   Final    FEW STENOTROPHOMONAS MALTOPHILIA CULTURE REINCUBATED FOR BETTER GROWTH Performed at [facility], [place] [address] [place], [place] [address]    Report Status PENDING  Incomplete     Patient was seen and examined on the day of discharge and was found to be in stable condition. Time coordinating discharge: 35 minutes including assessment and coordination of care, as well as examination of the patient.   SIGNED:  Dessa Phi, DO [facility] 08/02/2020, 12:02 PM

## 2020-08-02 NOTE — Progress Notes (Signed)
PMR Admission Coordinator Pre-Admission Assessment   Patient: Shawna Curry is an 86 y.o., female MRN: [id number] DOB: 02/28/1933 Height:   Weight: 52.7 kg                                                                                                                                                  Insurance Information HMO: yes    PPO:      PCP:      IPA:      80/20:      OTHER:  PRIMARY: [employer]      Policy#: [id number]      Subscriber: pt CM Name: Lattie Haw      Phone#: [phone]     Fax#: [phone] Pre-Cert#: [id number] [facility] for CIR provided by Lattie Haw with [employer] with updates due to fax listed above on 9/1.       Employer:  Benefits:  Phone #: [phone]     Name: n/a Eff. Date: 12/08/19     Deduct: $0      Out of Pocket Max: $3900 (met $3900)      Life Max: n/a  CIR: $295/day for days 1-6      SNF: 20 full days Outpatient:      Co-Pay: $10-$40/visit Home Health: 100%      Co-Pay:  DME: 80%     Co-Pay: 20% Providers:  SECONDARY:       Policy#:       Phone#:    Development worker, community:       Phone#:    The Therapist, art Information Summary" for patients in Inpatient Rehabilitation Facilities with attached "Privacy Act Garber Records" was provided and verbally reviewed with: Patient and Family   Emergency Contact Information Contact Information       Name Relation Home Work Mobile    [place] Son [phone]   [phone]    Hosman,Regina Daughter [phone]        Ratliff,Lakisha Granddaughter     [phone]         Current Medical History  Patient Admitting Diagnosis: R cerebral CVA and PEA arrest   History of Present Illness: Shawna Curry is a 86 y.o. right-handed female with history of atrial fibrillation, maintained on Eliquis and Plavix, hyperlipidemia, DVT of tibial vein, type 2 diabetes mellitus, left-sided PCA infarction, CAD/NSTEMI 02/2020, followed by Dr.Rohrbeck with [facility], history of right CEA left 80%,  followed by vascular surgery, acute on chronic systolic congestive heart failure. Pt has had multiple hospitalizations this year followed by [facility] this spring, and short term SNF for rehab 2x this summer.  She presented on 07/25/2020 with increasing DOE. Patient noted to be hypotensive and became unresponsive in the ED requiring intubation.  PEA arrest with 5 minutes of CPR and given epinephrine and bicarb also with lactic acidosis.  EKG  showed interval T waves in V5 V6 with 1 mm of elevation in V4 and placed on intravenous amiodarone and on IV heparin.  Admission chemistries potassium 5.2, BUN 51 creatinine 1.90, troponin 26-29, lactic acid 2.8, BNP 2010.  Initial echocardiogram with EF of 40-45%.  The left ventricle demonstrating global hypokinesis and plan to repeat echocardiogram 07/31/2020.  She was extubated 07/29/2020 and aspirin initiated 07/30/2020.  Close monitoring of renal function with creatinine elevated 2.31.  MRI of the brain completed 07/30/2020 after extubation and showed small acute posterior right cerebral infarction, advised to continue low-dose aspirin.  Patient is on a mechanical soft diet.  Therapy evaluations completed with recommendations of physical medicine rehab consult.   Past Medical History      Past Medical History:  Diagnosis Date  . Atrial fibrillation ([place])    . Coronary artery disease    . Hypertension        Family History  family history includes Diabetes in her mother.   Prior Rehab/Hospitalizations:  Has the patient had prior rehab or hospitalizations prior to admission? Yes   Has the patient had major surgery during 100 days prior to admission? No   Current Medications    Current Facility-Administered Medications:  .  0.9 %  sodium chloride infusion, , Intravenous, PRN, Erick Colace, NP, Paused at 07/27/20 1737 .  acetaminophen (TYLENOL) tablet 650 mg, 650 mg, Oral, Q4H, Erick Colace, NP, 650 mg at 08/02/20 1015 .  amiodarone (PACERONE)  tablet 200 mg, 200 mg, Oral, BID, Fay Records, MD, 200 mg at 08/02/20 1016 .  apixaban (ELIQUIS) tablet 2.5 mg, 2.5 mg, Oral, BID, Einar Grad, RPH, 2.5 mg at 08/02/20 1016 .  atorvastatin (LIPITOR) tablet 40 mg, 40 mg, Oral, Daily, Noemi Chapel P, DO, 40 mg at 08/02/20 1015 .  Chlorhexidine Gluconate Cloth 2 % PADS 6 each, 6 each, Topical, Daily, Erick Colace, NP, 6 each at 08/01/20 1033 .  docusate (COLACE) 50 MG/5ML liquid 100 mg, 100 mg, Oral, BID PRN, Erick Colace, NP .  furosemide (LASIX) tablet 40 mg, 40 mg, Oral, Daily, Fay Records, MD, 40 mg at 08/02/20 1015 .  gabapentin (NEURONTIN) tablet 300 mg, 300 mg, Oral, BID, Erick Colace, NP, 300 mg at 08/02/20 1015 .  insulin aspart (novoLOG) injection 0-15 Units, 0-15 Units, Subcutaneous, TID WC, Dessa Phi, DO, 3 Units at 08/01/20 1651 .  lip balm (CARMEX) ointment, , Topical, PRN, Erick Colace, NP, 1 application at 57/32/20 1802 .  MEDLINE mouth rinse, 15 mL, Mouth Rinse, q12n4p, Salvadore Dom E, NP, 15 mL at 08/01/20 1652 .  metoprolol succinate (TOPROL-XL) 24 hr tablet 25 mg, 25 mg, Oral, Daily, Fay Records, MD, 25 mg at 08/02/20 1015 .  potassium chloride (KLOR-CON) CR tablet 10 mEq, 10 mEq, Oral, Daily, Fay Records, MD, 10 mEq at 08/02/20 1016   Patients Current Diet:  Diet Order                  DIET DYS 3 Room service appropriate? Yes; Fluid consistency: Thin  Diet effective now                         Precautions / Restrictions Precautions Precautions: Fall Precaution Comments: truncal and bil UE ataxia Restrictions Weight Bearing Restrictions: No    Has the patient had 2 or more falls or a fall with injury in the past year?No   Prior  Activity Level Limited Community (1-2x/wk): modified independent with RW prior to admission, was cooking, cleaning, and completing her own ADLs with distant supervision (per daughter); pt does not drive.    Prior Functional Level Prior  Function Level of Independence: Needs assistance Gait / Transfers Assistance Needed: walking with RW with supervision ADL's / Homemaking Assistance Needed: family assists with housework, able to complete BADLs with supervision Comments: son and daughter alternate staying with pt   Self Care: Did the patient need help bathing, dressing, using the toilet or eating?  Independent   Indoor Mobility: Did the patient need assistance with walking from room to room (with or without device)? Independent   Stairs: Did the patient need assistance with internal or external stairs (with or without device)? Needed some help   Functional Cognition: Did the patient need help planning regular tasks such as shopping or remembering to take medications? Needed some help   Home Assistive Devices / [place] Devices/Equipment: Gilford Rile (specify type) Home Equipment: Toilet riser, Walker - 2 wheels, Cane - single point   Prior Device Use: Indicate devices/aids used by the patient prior to current illness, exacerbation or injury? Walker   Current Functional Level Cognition   Overall Cognitive Status: Impaired/Different from baseline Orientation Level: Oriented to person, Oriented to place, Disoriented to time, Disoriented to situation Following Commands: Follows one step commands inconsistently General Comments: pt with difficulty sequencing, follows simple commands     Extremity Assessment (includes Sensation/Coordination)   Upper Extremity Assessment: RUE deficits/detail, LUE deficits/detail RUE Deficits / Details: pt with noted weakness with automatic assist of LUE to move RUE. Noted ataxia with decreased spatial perception and frequent dropping of R UE during use RUE Coordination: decreased fine motor, decreased gross motor LUE Deficits / Details: better than R UE but difficulty with coordination and proprioception as well LUE Coordination: decreased fine motor, decreased gross motor   Lower Extremity Assessment: Defer to PT evaluation     ADLs   Overall ADL's : Needs assistance/impaired Eating/Feeding: Bed level, Moderate assistance Grooming: Minimal assistance, Sitting Grooming Details (indicate cue type and reason): Able to wash face using L hand with supervision, but will require increased assistance for fine motor tasks during grooming Upper Body Bathing: Moderate assistance, Sitting Lower Body Bathing: Maximal assistance, Sit to/from stand Upper Body Dressing : Maximal assistance, Sitting Lower Body Dressing: Total assistance, Bed level Toileting- Clothing Manipulation and Hygiene: Total assistance, Bed level General ADL Comments: Pt with deficits in cognition, coordination, proprioception, strength, sitting/standing balance impacting ability to complete ADLs/transfers without extensive assist     Mobility   Overal bed mobility: Needs Assistance Bed Mobility: Supine to Sit, Sit to Supine Supine to sit: HOB elevated, Mod assist Sit to supine: Min assist General bed mobility comments: mod A for pt to come to EoB mainly for achieving flexion at the hips, pt with use of bedrails,      Transfers   Overall transfer level: Needs assistance Equipment used: Ambulation equipment used Transfer via Lift Equipment: Stedy Transfers: Sit to/from Stand Sit to Stand: Max assist, From elevated surface General transfer comment: did not attempt due to safety      Ambulation / Gait / Stairs / Emergency planning/management officer   Ambulation/Gait General Gait Details: unable     Posture / Balance Dynamic Sitting Balance Sitting balance - Comments: pt sat EoB for 8-10 minutes and was intermittently min guard to modA with no seeming progression Balance Overall balance assessment: Needs assistance Sitting-balance support: Feet supported,  No upper extremity supported Sitting balance-Leahy Scale: Poor Sitting balance - Comments: pt sat EoB for 8-10 minutes and was intermittently min guard to  modA with no seeming progression Postural control: Right lateral lean, Posterior lean Standing balance support: Bilateral upper extremity supported, During functional activity Standing balance-Leahy Scale: Poor Standing balance comment: Mod A to Max A to maintain standing balance in [place] with 1 person assist     Special needs/care consideration Diabetic management yes and Designated visitor daughter, son, and daughter in law        Previous Home Environment (from acute therapy documentation) Living Arrangements: Alone Available Help at Discharge: Family, Available 24 hours/day Type of Home: House Home Layout: One level Home Access: Ramped entrance Bathroom Shower/Tub: Multimedia programmer: [place]: Yes Type of Person: [place] (if known): [facility] Additional Comments: PLOF and setup obtain from PT info (conversation with son) and pt confirmed home setup, but conflicing PLOF information as pt confused   Discharge Living Setting Plans for Discharge Living Setting: Patient's home Type of Home at Discharge: House Discharge Home Layout: One level Discharge Home Access: Ramped entrance (through the back) Discharge Bathroom Shower/Tub: Walk-in shower Discharge Bathroom Toilet: Standard Discharge Bathroom Accessibility: Yes How Accessible: Accessible via walker Does the patient have any problems obtaining your medications?: No   Social/Family/Support Systems Anticipated Caregiver: daughter Rollene Fare) and son Brita Romp) Anticipated Caregiver's Contact Information: Rollene Fare [phone], Brita Romp [phone] Ability/Limitations of Caregiver: min assist Caregiver Availability: 24/7 Discharge Plan Discussed with Primary Caregiver: Yes Is Caregiver In Agreement with Plan?: Yes Does Caregiver/Family have Issues with Lodging/Transportation while Pt is in Rehab?: No     Goals Patient/Family Goal for Rehab: PT/OT/SLP supervision  to min assist Expected length of stay: 12-16 days. Pt/Family Agrees to Admission and willing to participate: Yes Program Orientation Provided & Reviewed with Pt/Caregiver Including Roles  & Responsibilities: Yes  Barriers to Discharge: Insurance for SNF coverage     Decrease burden of Care through IP rehab admission: n/a   Possible need for SNF placement upon discharge: Not anticipated.    Patient Condition: I have reviewed medical records from [facility], spoken with CM, and patient and daughter. I met with patient at the bedside for inpatient rehabilitation assessment.  Patient will benefit from ongoing PT, OT and SLP, can actively participate in 3 hours of therapy a day 5 days of the week, and can make measurable gains during the admission.  Patient will also benefit from the coordinated team approach during an Inpatient Acute Rehabilitation admission.  The patient will receive intensive therapy as well as Rehabilitation physician, nursing, social worker, and care management interventions.  Due to bladder management, bowel management, safety, disease management, medication administration and patient education the patient requires 24 hour a day rehabilitation nursing.  The patient is currently max +2 with mobility and basic ADLs.  Discharge setting and therapy post discharge at home with home health is anticipated.  Patient has agreed to participate in the Acute Inpatient Rehabilitation Program and will admit today.   Preadmission Screen Completed By:  Michel Santee, PT, DPT 08/02/2020 11:57 AM ______________________________________________________________________   Discussed status with Dr. Posey Pronto on 08/02/20 at 11:57 AM and received approval for admission today.   Admission Coordinator:  Michel Santee, PT, DPT time 11:57 AM Sudie Grumbling 08/02/20

## 2020-08-02 NOTE — Progress Notes (Signed)
Inpatient Rehabilitation Medication Review by a Pharmacist  A complete drug regimen review was completed for this patient to identify any potential clinically significant medication issues.  Clinically significant medication issues were identified:  yes   Type of Medication Issue Identified Description of Issue Urgent (address now) Non-Urgent (address on AM team rounds) Plan Plan Accepted by Provider? (Yes / No / Pending AM Rounds)  Drug Interaction(s) (clinically significant)       Duplicate Therapy       Allergy       No Medication Administration End Date       Incorrect Dose  Gabapentin 200mg  TID PTA vs Gabapentin 300mg  BID while inpatient and now in rehab Non-urgent    Additional Drug Therapy Needed  Xalatan eye drops PTA not resumed.  Metformin XR 500mg  BID PTA not resumed - on moderate SSI Non-urgent Restart Xalatan   Other  PTA Folic Acid, Metanx are vitamin supplements that could be restarted in rehab if desired.  Ordered KCl 69mEq daily.  Was not on K supplement PTA despite being on Lasix.  Continue to monitor need for KCl with weekly BMET.  Amiodarone 200mg  BID started per cards.  Dose is typically tapered after a week or so. Non-urgent Weekly BMET to monitor K         Follow-up plans to taper Amio dose     Name of provider notified for urgent issues identified: n/a  Provider Method of Notification: n/a   For non-urgent medication issues to be resolved on team rounds tomorrow morning a [facility] Secure Chat Handoff was sent to: Shauna Hugh and Benetta Spar, pharmacists   Pharmacist comments: Follow-up plans to taper Amio with cardiology.  Restart Xalatan.  Follow-up K weekly.  Time spent performing this drug regimen review (minutes):  20 minutes   [employer], Pharm.D., BCPS Clinical Pharmacist  **Pharmacist phone directory can be found on [email or web address] listed under [place].  08/02/2020 9:14 PM

## 2020-08-02 NOTE — Progress Notes (Signed)
Inpatient Rehab Admissions Coordinator:   I have a bed available for Ms. Carre today and acute medical team in agreement for her to admit to CIR.  Will let pt/family and TOC team know.   Shann Medal, PT, DPT Admissions Coordinator [phone] 08/02/20  11:56 AM

## 2020-08-02 NOTE — Plan of Care (Signed)
  Problem: Clinical Measurements: Goal: Respiratory complications will improve Outcome: Progressing Goal: Cardiovascular complication will be avoided Outcome: Progressing   

## 2020-08-02 NOTE — Progress Notes (Signed)
STROKE TEAM PROGRESS NOTE   INTERVAL HISTORY No acute event overnight. Pt is being discharged to CIR. Her CUS showed left ICA 40-59% stenosis. Will need to continue follow up with Dr. Radene Knee vascular surgery as outpt.   Vitals:   08/02/20 0002 08/02/20 0422 08/02/20 0828 08/02/20 1153  BP: 124/67 135/67 (!) 146/69 117/61  Pulse: (!) 58 (!) 57 60 60  Resp: 15 15 16 16   Temp: 98.7 F (37.1 C) 98.2 F (36.8 C) 98.1 F (36.7 C) 97.9 F (36.6 C)  TempSrc: Axillary Axillary Oral Oral  SpO2: 98% 98% 93% 97%  Weight:  52.7 kg     CBC:  Recent Labs  Lab 07/27/20 1250 07/27/20 1811 07/31/20 0627 08/01/20 0523  WBC 6.1   < > 11.3* 8.9  NEUTROABS 4.8  --   --   --   HGB 10.3*   < > 9.9* 9.1*  HCT 32.8*   < > 30.3* 28.0*  MCV 89.1   < > 86.3 84.8  PLT 221   < > 180 150   < > = values in this interval not displayed.   Basic Metabolic Panel:  Recent Labs  Lab 07/29/20 0236 07/29/20 1351 07/29/20 2215 07/30/20 0846 08/01/20 0523 08/02/20 0410  NA  --    < >  --    < > 139 142  K  --    < >  --    < > 3.8 3.9  CL  --    < >  --    < > 104 106  CO2  --    < >  --    < > 25 25  GLUCOSE  --    < >  --    < > 131* 117*  BUN  --    < >  --    < > 53* 48*  CREATININE  --    < >  --    < > 1.75* 1.44*  CALCIUM  --    < >  --    < > 8.6* 8.6*  MG 2.8*  --  2.3  --   --   --   PHOS 7.2*  --  3.9  --   --   --    < > = values in this interval not displayed.   Lipid Panel:  Recent Labs  Lab 08/02/20 0410  CHOL 114  TRIG 58  HDL 41  CHOLHDL 2.8  VLDL 12  LDLCALC 61   HgbA1c:  Recent Labs  Lab 07/28/20 0111  HGBA1C 6.2*   Urine Drug Screen: No results for input(s): LABOPIA, COCAINSCRNUR, LABBENZ, AMPHETMU, THCU, LABBARB in the last 168 hours.  Alcohol Level No results for input(s): ETH in the last 168 hours.  IMAGING past 24 hours VAS US CAROTID  Result Date: 08/01/2020 Carotid Arterial Duplex Study Indications:       CVA. Comparison Study:  No prior study Performing  Technologist: Maudry Mayhew MHA, RDMS, RVT, RDCS  Examination Guidelines: A complete evaluation includes B-mode imaging, spectral Doppler, color Doppler, and power Doppler as needed of all accessible portions of each vessel. Bilateral testing is considered an integral part of a complete examination. Limited examinations for reoccurring indications may be performed as noted.  Right Carotid Findings: +----------+--------+--------+--------+--------------------------+--------+           PSV cm/sEDV cm/sStenosisPlaque Description        Comments +----------+--------+--------+--------+--------------------------+--------+ CCA Prox  49      8                                                  +----------+--------+--------+--------+--------------------------+--------+  CCA Distal41      7               smooth and heterogenous            +----------+--------+--------+--------+--------------------------+--------+ ICA Prox  27      9               smooth and heterogenous            +----------+--------+--------+--------+--------------------------+--------+ ICA Distal68      15                                                 +----------+--------+--------+--------+--------------------------+--------+ ECA       69      6               heterogenous and irregular         +----------+--------+--------+--------+--------------------------+--------+ +----------+--------+-------+----------------+-------------------+           PSV cm/sEDV cmsDescribe        Arm Pressure (mmHG) +----------+--------+-------+----------------+-------------------+ [id number]             Multiphasic, WNL                    +----------+--------+-------+----------------+-------------------+ +---------+--------+--+--------+-+---------+ VertebralPSV cm/s31EDV cm/s7Antegrade +---------+--------+--+--------+-+---------+  Left Carotid Findings:  +----------+-------+-------+--------+---------------------------------+--------+           PSV    EDV    StenosisPlaque Description               Comments           cm/s   cm/s                                                     +----------+-------+-------+--------+---------------------------------+--------+ CCA Prox  68     10                                                       +----------+-------+-------+--------+---------------------------------+--------+ CCA Distal63     13             smooth and heterogenous                   +----------+-------+-------+--------+---------------------------------+--------+ ICA Prox  177    51             heterogenous, irregular and                                               calcific                                  +----------+-------+-------+--------+---------------------------------+--------+ ICA Distal72     22                                                       +----------+-------+-------+--------+---------------------------------+--------+  ECA       67     10             calcific                                  +----------+-------+-------+--------+---------------------------------+--------+ +----------+--------+--------+----------------+-------------------+           PSV cm/sEDV cm/sDescribe        Arm Pressure (mmHG) +----------+--------+--------+----------------+-------------------+ [id number]             Multiphasic, WNL                    +----------+--------+--------+----------------+-------------------+ +---------+--------+--+--------+-+---------+ VertebralPSV cm/s48EDV cm/s7Antegrade +---------+--------+--+--------+-+---------+   Summary: Right Carotid: Velocities in the right ICA are consistent with a 1-39% stenosis. Left Carotid: Velocities in the left ICA are consistent with a 40-59% stenosis. Vertebrals:  Bilateral vertebral arteries demonstrate antegrade flow. Subclavians:  Normal flow hemodynamics were seen in bilateral subclavian              arteries. *See table(s) above for measurements and observations.  Electronically signed by Monica Martinez MD on 08/01/2020 at 3:29:12 PM.    Final     PHYSICAL EXAM    Temp:  [97.9 F (36.6 C)-98.7 F (37.1 C)] 97.9 F (36.6 C) (08/27 1153) Pulse Rate:  [57-84] 60 (08/27 1153) Resp:  [15-19] 16 (08/27 1153) BP: (117-146)/(61-77) 117/61 (08/27 1153) SpO2:  [92 %-98 %] 97 % (08/27 1153) Weight:  [52.7 kg] 52.7 kg (08/27 0422)  General - Well nourished, well developed, in no apparent distress.  Ophthalmologic - fundi not visualized due to noncooperation.  Cardiovascular - irregularly irregular heart rate and rhythm.  Neuro - awake alert pleasant orientated to people and place, not to time. Fluent speech, no aphasia, still mild paucity of speech, mild dysarthria. Able to name and repeat. Visual field full, counting fingers bilaterally. No gaze preference, mild right nasolabial fold flattening. Moving BLEs and LUE normally, however, right UE proximal 3-/5 with drift to bed within 5 sec. Bicep and tricep and finger grip 4/5. LUE FTN intact grossly. Sensation subjectively symmetrical. Gait not tested.   ASSESSMENT/PLAN Ms. Shawna Curry is a 86 y.o. female with history of CAD, atrial fibrillation (on Eliquis), left PCA CVA (02/2020), HLD, HTN and PAD who presented initially on 8/21 for SOB. Had witnessed PEA arrest. Resuscitated. Physical movement limited. MRI was then obtained which found acute small posterior right cerebral hemisphere ischemic infarction near the junction of the temporal, parietal, and occipital lobes (posterior MCA or border zone).   Neurology consulted 8/25 for MRI finding.  Stroke:   Small R posterior MCA infarct embolic likely secondary to PEA arrest   CT head 8/22 No acute abnormality. Small vessel disease.   MRI 8/24  Small posterior R cerebral infarct. Mild to moderate small vessel disease.    Carotid Doppler  L ICA 40-59%  2D Echo EF 35-40%. No source of embolus. Severe MR. LA severely dilated.   LDL 61  HgbA1c 6.2  VTE prophylaxis - Eliquis  clopidogrel 75 mg daily and Eliquis (apixaban) daily prior to admission, now on Eliquis (apixaban) daily. Continue at d/c.   Therapy recommendations:  CIR  Disposition:  CIR   Carotid Stenosis   Hx of right CEA  Recent CTA neck in 02/2020 at [facility] severe left ICA stenosis at least 80% and right ICA widely patent  CUS L ICA 40-59%  Left ICA stenosis  seems to be asymptomatic this time  On eliquis  Follow up with Dr. Radene Knee vascular surgery on 09/23/20  Hx stroke/TIA  02/2020 admitted to [facility] for SOB, LE edema, right-sided weakness.  Found to have L PCA territory infarcts due to left P2 occlusion.  CTA head and neck severe left ICA stenosis at least 80%, right ACA patent.  Also found to have AF in RVR, and right LE DVT. Placed on Eliquis.  Discharged with Eliquis 2.5 twice daily, Plavix 75, Lipitor 20 and Zetia.  On follow-up with neurology at [facility], found patient not on Eliquis since 04/2020, resumed Eliquis 2.5 twice daily.  Baseline walk at home with walker  Coronary artery disease  MI x2 in 02/24/2020 and 04/25/2020   Patient cardiologist within a week for the system recommend Eliquis 2.5+ either aspirin 81 or Plavix 75.  On Eliquis 2.5 and Plavix 75 PTA  s/p PEA arrest this admission s/p resuscitation including intubation    Cardiology on board, heparin IV switch to Eliquis.  Aspirin discontinued.  Atrial Fibrillation  AF with RVR found in 02/2020 at [facility]  Home anticoagulation:  Eliquis (apixaban) daily  . Now on eliquis, Continue at discharge . On amiodarone and metoprolol   Hypertension  Stable . Long-term BP goal normotensive  Hyperlipidemia  Home meds:  lipitor 20 + zetia 10  Now on lipitior 40   LDL 61, goal < 70  transaminitis likely d/t cardiac arrest, LFTs improving  184/120->107/105->75/85-> 41/59  Continue statin at discharge  Other Stroke Risk Factors  Advanced age  Former Cigarette smoker  Acute on chronic Congestive heart failure, EF 35-40%  PAD  Other Active Problems  Acute hypioxemic respiraotyr failure, resolved on RA  AKI  Leukocytosis, WBC 24.5->18.6->11.3->8.9, resolved  Elevated TSH 5.642, for OP follow up  Hospital day # 6  Neurology will sign off. Please call with questions. Pt will follow up with [facility] neurology as scheduled on 12/16/20. Thanks for the consult.   Rosalin Hawking, MD PhD Stroke Neurology 08/02/2020 12:38 PM    To contact Stroke Continuity provider, please refer to [email or web address] After hours, contact General Neurology

## 2020-08-02 NOTE — H&P (Addendum)
Physical Medicine and Rehabilitation Admission H&P    CC: Weakness : HPI: Shawna Curry is an 86 year old right-handed female with history of atrial fibrillation maintained on Eliquis as well as Plavix, hyperlipidemia, DVT of tibial vein, type 2 diabetes mellitus, PCA infarction with residual right-sided weakness, CAD/non-STEMI 02/2020 followed by Dr.Rohrbeck with [facility], history of right CEA, left carotid stenosis  followed by vascular surgery, acute on chronic systolic congestive heart failure.  History taken from chart review due to cognition.  Patient lives alone 1 level home with ramped entrance.  Ambulates with a rolling walker.  She has good family support with family alternating schedules to provide assistance.  Earlier this year patient went to the hospital on multiple occasions short stay in skilled nursing facility was receiving first outpatient therapy and then home health therapy.  She presented on 07/25/2020 with SOB.  Noted to be hypotensive became unresponsive in the ED requiring intubation/PEA arrest 5 minutes of CPR given epinephrine, bicarb also with given.  EKG showed interval T waves in V5 V6 with 1 mm of elevation in V4 and placed on intravenous amiodarone as well as IV heparin.  EEG negative for seizures, nonspecific generalized cerebral dysfunction encephalopathy no seizure activity.  Carotid Dopplers with 40 to 59% left ICA stenosis.  Admission chemistries potassium 5.2, BUN 51 creatinine 1.90 troponin 26-29, lactic acid 2.8, BNP [id number].  Initial echocardiogram with ejection fraction of 40-45%.  The left ventricle demonstrating global hypokinesis and echocardiogram was repeated 07/31/2020 showing ejection fraction 35 to 40% mild concentric left ventricular hypertrophy moderately elevated pulmonary artery systolic pressure.Marland Kitchen  She was extubated 07/29/2020 aspirin initiated 07/30/2020.  Close monitor renal function elevated 2.31 and improved with gentle hydration  latest creatinine 1.75.  MRI of the brain completed 07/30/2020 after extubation showed a small acute posterior right cerebral infarction advised to  Resume home Eliquis per Neurology and initial aspirin was then discontinued.  Patient is on mechanical soft diet.  Therapy evaluations completed and patient was admitted for a comprehensive rehab program.  Please see preadmission assessment from earlier today as well.  Review of Systems  Unable to perform ROS: Mental acuity   Past Medical History:  Diagnosis Date  . Atrial fibrillation ([place])   . Coronary artery disease   . Hypertension    Past Surgical History:  Procedure Laterality Date  . ABDOMINAL HYSTERECTOMY    . HAND TENDON SURGERY    . JOINT REPLACEMENT     R hip x 2  . PERCUTANEOUS PLACEMENT INTRAVASCULAR STENT CERVICAL CAROTID ARTERY    . TONSILLECTOMY     Family History  Problem Relation Age of Onset  . Diabetes Mother    Social History:  reports that she has quit smoking. She has never used smokeless tobacco. She reports that she does not drink alcohol and does not use drugs. Allergies: No Known Allergies Medications Prior to Admission  Medication Sig Dispense Refill  . acetaminophen (TYLENOL) 650 MG CR tablet Take 1,300 mg by mouth daily as needed for pain.    Marland Kitchen alendronate (FOSAMAX) 35 MG tablet Take 35 mg by mouth every Monday.    Marland Kitchen amiodarone (PACERONE) 200 MG tablet Take 1 tablet (200 mg total) by mouth 2 (two) times daily.    Marland Kitchen apixaban (ELIQUIS) 2.5 MG TABS tablet Take 2.5 mg by mouth 2 (two) times daily.    Marland Kitchen atorvastatin (LIPITOR) 40 MG tablet Take 1 tablet (40 mg total) by mouth every evening.    Marland Kitchen  folic acid (FOLVITE) 1 MG tablet Take 1 mg by mouth daily.    . furosemide (LASIX) 20 MG tablet Take 40 mg by mouth daily.    Marland Kitchen gabapentin (NEURONTIN) 100 MG capsule Take 200 mg by mouth 3 (three) times daily.    Marland Kitchen l-methylfolate-B6-B12 (METANX) 3-35-2 MG TABS tablet Take 1 tablet by mouth 2 (two) times daily.    Marland Kitchen  latanoprost (XALATAN) 0.005 % ophthalmic solution Place 1 drop into both eyes at bedtime.    . metFORMIN (GLUCOPHAGE-XR) 500 MG 24 hr tablet Take 500 mg by mouth 2 (two) times daily with a meal.    . metoprolol succinate (TOPROL-XL) 25 MG 24 hr tablet Take 1 tablet (25 mg total) by mouth daily. 30 tablet 0    Drug Regimen Review Drug regimen was reviewed and remains appropriate with no significant issues identified  Home: Home Living Family/patient expects to be discharged to:: Private residence Living Arrangements: Alone   Functional History:    Functional Status:  Mobility:          ADL:    Cognition: Cognition Orientation Level: Oriented to person    Physical Exam: Blood pressure 131/69, pulse 62, temperature 98.6 F (37 C), temperature source Oral, resp. rate 17, height 5\' 8"  (1.727 m), weight 54.7 kg, SpO2 98 %. Physical Exam Vitals reviewed.  Constitutional:      Comments: Frail  HENT:     Head: Normocephalic and atraumatic.     Right Ear: External ear normal.     Left Ear: External ear normal.     Nose: Nose normal.  Eyes:     General:        Right eye: No discharge.        Left eye: No discharge.     Extraocular Movements: Extraocular movements intact.  Cardiovascular:     Rate and Rhythm: Normal rate and regular rhythm.  Pulmonary:     Effort: Pulmonary effort is normal. No respiratory distress.     Breath sounds: No stridor.  Abdominal:     General: Abdomen is flat. Bowel sounds are normal. There is no distension.  Musculoskeletal:     Cervical back: Normal range of motion and neck supple.     Comments: No edema or tenderness in extremities  Skin:    General: Skin is warm and dry.  Neurological:     Mental Status: She is alert.     Comments: Alert Oriented to name but not year.   She does have some difficulty naming objects with decrease in recall.   Follows simple commands. Motor: RUE: 3/5 proximal distally LUE: 4+/5 proximal  distal Bilateral lower extremities: 4+/5 distal  Psychiatric:        Speech: Speech is tangential.        Cognition and Memory: Cognition is impaired. Memory is impaired.     Results for orders placed or performed during the hospital encounter of 08/02/20 (from the past 48 hour(s))  Glucose, capillary     Status: Abnormal   Collection Time: 08/02/20  8:23 PM  Result Value Ref Range   Glucose-Capillary 189 (H) 70 - 99 mg/dL    Comment: Glucose reference range applies only to samples taken after fasting for at least 8 hours.   VAS US CAROTID  Result Date: 08/01/2020 Carotid Arterial Duplex Study Indications:       CVA. Comparison Study:  No prior study Performing Technologist: Maudry Mayhew MHA, RDMS, RVT, RDCS  Examination Guidelines: A complete evaluation  includes B-mode imaging, spectral Doppler, color Doppler, and power Doppler as needed of all accessible portions of each vessel. Bilateral testing is considered an integral part of a complete examination. Limited examinations for reoccurring indications may be performed as noted.  Right Carotid Findings: +----------+--------+--------+--------+--------------------------+--------+           PSV cm/sEDV cm/sStenosisPlaque Description        Comments +----------+--------+--------+--------+--------------------------+--------+ CCA Prox  49      8                                                  +----------+--------+--------+--------+--------------------------+--------+ CCA Distal41      7               smooth and heterogenous            +----------+--------+--------+--------+--------------------------+--------+ ICA Prox  27      9               smooth and heterogenous            +----------+--------+--------+--------+--------------------------+--------+ ICA Distal68      15                                                 +----------+--------+--------+--------+--------------------------+--------+ ECA       69       6               heterogenous and irregular         +----------+--------+--------+--------+--------------------------+--------+ +----------+--------+-------+----------------+-------------------+           PSV cm/sEDV cmsDescribe        Arm Pressure (mmHG) +----------+--------+-------+----------------+-------------------+ [id number]             Multiphasic, WNL                    +----------+--------+-------+----------------+-------------------+ +---------+--------+--+--------+-+---------+ VertebralPSV cm/s31EDV cm/s7Antegrade +---------+--------+--+--------+-+---------+  Left Carotid Findings: +----------+-------+-------+--------+---------------------------------+--------+           PSV    EDV    StenosisPlaque Description               Comments           cm/s   cm/s                                                     +----------+-------+-------+--------+---------------------------------+--------+ CCA Prox  68     10                                                       +----------+-------+-------+--------+---------------------------------+--------+ CCA Distal63     13             smooth and heterogenous                   +----------+-------+-------+--------+---------------------------------+--------+ ICA Prox  177    51             heterogenous,  irregular and                                               calcific                                  +----------+-------+-------+--------+---------------------------------+--------+ ICA Distal72     22                                                       +----------+-------+-------+--------+---------------------------------+--------+ ECA       67     10             calcific                                  +----------+-------+-------+--------+---------------------------------+--------+ +----------+--------+--------+----------------+-------------------+           PSV cm/sEDV cm/sDescribe         Arm Pressure (mmHG) +----------+--------+--------+----------------+-------------------+ [id number]             Multiphasic, WNL                    +----------+--------+--------+----------------+-------------------+ +---------+--------+--+--------+-+---------+ VertebralPSV cm/s48EDV cm/s7Antegrade +---------+--------+--+--------+-+---------+   Summary: Right Carotid: Velocities in the right ICA are consistent with a 1-39% stenosis. Left Carotid: Velocities in the left ICA are consistent with a 40-59% stenosis. Vertebrals:  Bilateral vertebral arteries demonstrate antegrade flow. Subclavians: Normal flow hemodynamics were seen in bilateral subclavian              arteries. *See table(s) above for measurements and observations.  Electronically signed by Monica Martinez MD on 08/01/2020 at 3:29:12 PM.    Final        Medical Problem List and Plan: 1.  Decreased functional mobility  secondary to right cerebral infarction near the junction of the temporal parietal and occipital lobes after PEA arrest as well as history of PCA infarction March 2021 with residual right hemiparesis  -patient may shower  -ELOS/Goals: 12-16 days/supervision/min a  Admit to CIR 2.  Antithrombotics: -DVT/anticoagulation: Intravenous heparin transitioned back to Eliquis  -antiplatelet therapy: N/A 3. Pain Management: Neurontin 300 mg twice daily 4. Mood: Provide emotional support  -antipsychotic agents: N/A 5. Neuropsych: This patient is not capable of making decisions on her own behalf. 6. Skin/Wound Care: Routine skin checks 7. Fluids/Electrolytes/Nutrition: Routine in and outs.  CMP ordered. 8.  Atrial fibrillation.  Amiodarone 200 mg twice daily, Toprol-XL 25 mg daily  Monitor with increased exertion. 9.  CAD/non-STEMI.  Follow-up cardiology services 10.  Type 2 diabetes mellitus.  Hemoglobin A1c 6.2.  Patient on Glucophage 500 mg twice daily prior to admission.  Resume as needed  Monitor with  increased mobility. 11.  Acute on chronic systolic congestive heart failure.  Lasix 40 mg daily.  Monitor for any signs of fluid overload  Daily weights. 12.  History of CEA as well as left carotid stenosis.  Follow-up vascular surgery 13.  Hyperlipidemia: Lipitor 14.  AKI.    CMP ordered.  Lavon Paganini Angiulli, PA-C 08/02/2020  I have personally performed a face to face  diagnostic evaluation, including, but not limited to relevant history and physical exam findings, of this patient and developed relevant assessment and plan.  Additionally, I have reviewed and concur with the physician assistant's documentation above.  Delice Lesch, MD, ABPMR  The patient's status has not changed. The original post admission physician evaluation remains appropriate, and any changes from the pre-admission screening or documentation from the acute chart are noted above.   Delice Lesch, MD, ABPMR

## 2020-08-03 ENCOUNTER — Inpatient Hospital Stay (HOSPITAL_COMMUNITY): Payer: Medicare HMO

## 2020-08-03 ENCOUNTER — Inpatient Hospital Stay (HOSPITAL_COMMUNITY): Payer: Medicare HMO | Admitting: Physical Therapy

## 2020-08-03 ENCOUNTER — Inpatient Hospital Stay (HOSPITAL_COMMUNITY): Payer: Medicare HMO | Admitting: Occupational Therapy

## 2020-08-03 DIAGNOSIS — I639 Cerebral infarction, unspecified: Secondary | ICD-10-CM

## 2020-08-03 LAB — CULTURE, RESPIRATORY W GRAM STAIN

## 2020-08-03 LAB — GLUCOSE, CAPILLARY
Glucose-Capillary: 156 mg/dL — ABNORMAL HIGH (ref 70–99)
Glucose-Capillary: 112 mg/dL — ABNORMAL HIGH (ref 70–99)
Glucose-Capillary: 216 mg/dL — ABNORMAL HIGH (ref 70–99)

## 2020-08-03 MED ORDER — GABAPENTIN 600 MG PO TABS
300.0000 mg | ORAL_TABLET | Freq: Every day | ORAL | Status: DC
  Administered 2020-08-04 – 2020-08-05 (×2): 300 mg via ORAL
  Filled 2020-08-03 (×2): qty 1

## 2020-08-03 MED ORDER — METFORMIN HCL 500 MG PO TABS
500.0000 mg | ORAL_TABLET | Freq: Every day | ORAL | Status: DC
  Administered 2020-08-04 – 2020-08-21 (×18): 500 mg via ORAL
  Filled 2020-08-03 (×18): qty 1

## 2020-08-03 NOTE — Evaluation (Signed)
Occupational Therapy Assessment and Plan  Patient Details  Name: Shawna Curry MRN: [id number] Date of Birth: [birth year]  OT Diagnosis: abnormal posture, apraxia, ataxia, cognitive deficits and hemiplegia affecting dominant side Rehab Potential: Rehab Potential (ACUTE ONLY): Fair ELOS: 21-23 days   Today's Date: 08/03/2020 OT Individual Time: 1300-1415 OT Individual Time Calculation (min): 75 min     Hospital Problem: Principal Problem:   Right sided cerebral infarction [facility])   Past Medical History:  Past Medical History:  Diagnosis Date  . Atrial fibrillation ([place])   . Coronary artery disease   . Hypertension    Past Surgical History:  Past Surgical History:  Procedure Laterality Date  . ABDOMINAL HYSTERECTOMY    . HAND TENDON SURGERY    . JOINT REPLACEMENT     R hip x 2  . PERCUTANEOUS PLACEMENT INTRAVASCULAR STENT CERVICAL CAROTID ARTERY    . TONSILLECTOMY      Assessment & Plan Clinical Impression: Shawna Curry is an 86 year old right-handed female with history of atrial fibrillation maintained on Eliquis as well as Plavix, hyperlipidemia, DVT of tibial vein, type 2 diabetes mellitus, PCA infarction with residual right-sided weakness, CAD/non-STEMI 02/2020 followed by Dr.Rohrbeck with [facility], history of right CEA, left carotid stenosis  followed by vascular surgery, acute on chronic systolic congestive heart failure.  History taken from chart review due to cognition.  Patient lives alone 1 level home with ramped entrance.  Ambulates with a rolling walker.  She has good family support with family alternating schedules to provide assistance.  Earlier this year patient went to the hospital on multiple occasions short stay in skilled nursing facility was receiving first outpatient therapy and then home health therapy.  She presented on 07/25/2020 with SOB.  Noted to be hypotensive became unresponsive in the ED requiring intubation/PEA arrest 5 minutes of CPR  given epinephrine, bicarb also with given.  EKG showed interval T waves in V5 V6 with 1 mm of elevation in V4 and placed on intravenous amiodarone as well as IV heparin.  EEG negative for seizures, nonspecific generalized cerebral dysfunction encephalopathy no seizure activity.  Carotid Dopplers with 40 to 59% left ICA stenosis.  Admission chemistries potassium 5.2, BUN 51 creatinine 1.90 troponin 26-29, lactic acid 2.8, BNP [id number].  Initial echocardiogram with ejection fraction of 40-45%.  The left ventricle demonstrating global hypokinesis and echocardiogram was repeated 07/31/2020 showing ejection fraction 35 to 40% mild concentric left ventricular hypertrophy moderately elevated pulmonary artery systolic pressure.Marland Kitchen  She was extubated 07/29/2020 aspirin initiated 07/30/2020.  Close monitor renal function elevated 2.31 and improved with gentle hydration latest creatinine 1.75.  MRI of the brain completed 07/30/2020 after extubation showed a small acute posterior right cerebral infarction advised to  Resume home Eliquis per Neurology and initial aspirin was then discontinued.  Patient is on mechanical soft diet.  Therapy evaluations completed and patient was admitted for a comprehensive rehab program.  Please see preadmission assessment from earlier today as well.     Patient transferred to CIR on 08/02/2020 .    Patient currently requires total with basic self-care skills secondary to muscle weakness and muscle joint tightness, decreased cardiorespiratoy endurance, abnormal tone, unbalanced muscle activation, motor apraxia, ataxia, decreased coordination and decreased motor planning, decreased attention to right, decreased attention, decreased awareness, decreased problem solving, decreased safety awareness, decreased memory and delayed processing and decreased sitting balance, decreased postural control and decreased balance strategies.  Prior to hospitalization, patient could complete basic ADLs and cooking  with modified  independent .  Patient will benefit from skilled intervention to increase independence with basic self-care skills prior to discharge home with care partner.  Anticipate patient will require 24 hour supervision andmoderate physical assistance and follow up home health.    OT - End of Session Activity Tolerance: Tolerates 10 - 20 min activity with multiple rests Endurance Deficit: Yes OT Assessment Rehab Potential (ACUTE ONLY): Fair OT Barriers to Discharge: Other (comments) OT Barriers to Discharge Comments: multiple deficits with cognition, sensation, praxis that may inhibit physical progression OT Patient demonstrates impairments in the following area(s): Balance;Cognition;Endurance;Motor;Perception;Safety;Sensory OT Basic ADL's Functional Problem(s): Eating;Grooming;Bathing;Dressing;Toileting OT Transfers Functional Problem(s): Toilet OT Additional Impairment(s): Fuctional Use of Upper Extremity OT Plan OT Intensity: Minimum of 1-2 x/day, 45 to 90 minutes OT Frequency: 5 out of 7 days OT Duration/Estimated Length of Stay: 21-23 days OT Treatment/Interventions: Balance/vestibular training;Cognitive remediation/compensation;Discharge planning;Functional mobility training;DME/adaptive equipment instruction;Disease mangement/prevention;Neuromuscular re-education;Patient/family education;Psychosocial support;Therapeutic Activities;Self Care/advanced ADL retraining;Therapeutic Exercise;UE/LE Strength taining/ROM;UE/LE Coordination activities;Visual/perceptual remediation/compensation OT Self Feeding Anticipated Outcome(s): supervision OT Basic Self-Care Anticipated Outcome(s): mod A OT Toileting Anticipated Outcome(s): mod A OT Bathroom Transfers Anticipated Outcome(s): mod A to toilet OT Recommendation Patient destination: Home Follow Up Recommendations: Skilled nursing facility (SNF if pt is not able to progress past a total A of 2 level with her mobility) Equipment  Recommended: To be determined   OT Evaluation Precautions/Restrictions  Precautions Precautions: Fall Precaution Comments: truncal and bil UE ataxia, severely impaired motor planning  Pain Pain Assessment Pain Score: 0-No pain Home Living/Prior Functioning Home Living Family/patient expects to be discharged to:: Private residence Living Arrangements: Alone Available Help at Discharge: Family, Available 24 hours/day Type of Home: House Home Access: Ramped entrance Home Layout: One level Bathroom Shower/Tub: Multimedia programmer: Standard Additional Comments: son clarified that pt lives alone and the kids rotate coming to stay with her  Lives With: Alone IADL History Education: high school and beauty school Prior Function Level of Independence: Requires assistive device for independence, Independent with homemaking with ambulation, Independent with basic ADLs, Independent with gait  Able to Take Stairs?: Yes Driving: Yes Vocation: Retired Comments: son and daughter alternate staying with pt; pt loves to cook and bake Vision Baseline Vision/History: Wears glasses Wears Glasses: At all times Patient Visual Report: No change from baseline Additional Comments: to be assessed further, no significant deficits noted on eval Perception  Perception: Impaired Inattention/Neglect: Does not attend to right visual field;Does not attend to right side of body Spatial Orientation: midline orientation severely impaired Praxis Praxis: Impaired Praxis Impairment Details: Motor planning;Ideomotor;Ideation Praxis-Other Comments: severely impaired Cognition Overall Cognitive Status: Impaired/Different from baseline Arousal/Alertness: Awake/alert Orientation Level: Person;Place ("hospital"  but thought she was at [facility]) Year: Other (Comment) (1996) Month: May Day of Week: Incorrect Memory: Impaired Memory Impairment: Storage deficit;Decreased recall of new  information;Retrieval deficit;Decreased short term memory;Decreased long term memory Decreased Long Term Memory: Verbal basic;Functional basic Decreased Short Term Memory: Verbal basic;Functional basic Immediate Memory Recall: Sock;Blue;Bed Memory Recall Sock: Not able to recall Memory Recall Blue: Not able to recall Memory Recall Bed: Not able to recall Attention: Sustained Sustained Attention: Impaired Sustained Attention Impairment: Functional basic;Verbal complex Awareness: Impaired Awareness Impairment: Intellectual impairment;Emergent impairment (safety awareness) Problem Solving: Impaired Problem Solving Impairment: Verbal basic;Functional basic Safety/Judgment: Impaired Comments: poor awareness to situation and deficits Sensation Sensation Light Touch: Impaired Detail Peripheral sensation comments: base line neuropathy in BLE Hot/Cold: Appears Intact Proprioception: Impaired Detail Proprioception Impaired Details: Impaired LUE;Impaired RUE;Impaired RLE;Impaired LLE Stereognosis: Impaired by  gross assessment Additional Comments: Poor proprioception and kinesthesia in all 4 extremities with increased deficits on the R. Coordination Gross Motor Movements are Fluid and Coordinated: No Fine Motor Movements are Fluid and Coordinated: No Coordination and Movement Description: ataxia R>L in UE/LE Finger Nose Finger Test: unable to follow directions for 2 step test, pt kept trying to reach her left hand up and then grab R hand and reach overhead Heel Shin Test: severe ataxia in the RLE, moderate on the LLE Motor  Motor Motor: Hemiplegia;Ataxia;Motor apraxia;Abnormal postural alignment and control Motor - Skilled Clinical Observations: ataxia in all 4 extremities R>L and mild hemiplegia RUE>RLE  Trunk/Postural Assessment  Postural Control Postural Control: Deficits on evaluation Righting Reactions: limited Protective Responses: delayed. Postural Limitations: posterior Bias and R  lateral lean  Balance Static Sitting Balance Static Sitting - Level of Assistance: 3: Mod assist Dynamic Sitting Balance Dynamic Sitting - Level of Assistance: 1: +1 Total assist Static Standing Balance Static Standing - Level of Assistance: Not tested (comment) (unable to safely come to stand in stedy lift) Extremity/Trunk Assessment RUE Assessment Active Range of Motion (AROM) Comments: sh flexion to 45 degrees, but pt will use L hand to get R arm "started" and then she can lift arm up General Strength Comments: grossly 4/5 with poor coordination LUE Assessment General Strength Comments: grossly 4+/5  Care Tool Care Tool Self Care Eating   Eating Assist Level: Maximal Assistance - Patient 25 - 49%    Oral Care    Oral Care Assist Level: Maximal assistance - Patient 25 - 49%    Bathing   Body parts bathed by patient: Abdomen;Chest;Face Body parts bathed by helper: Right arm;Left arm;Front perineal area;Buttocks;Right upper leg;Left upper leg;Right lower leg;Left lower leg   Assist Level: Maximal Assistance - Patient 24 - 49%    Upper Body Dressing(including orthotics)   What is the patient wearing?: Hospital gown only   Assist Level: Total Assistance - Patient < 25%    Lower Body Dressing (excluding footwear)   What is the patient wearing?: Incontinence brief Assist for lower body dressing: Total Assistance - Patient < 25%    Putting on/Taking off footwear   What is the patient wearing?: Non-skid slipper socks Assist for footwear: Total Assistance - Patient < 25%       Care Tool Toileting Toileting activity   Assist for toileting: 2 Helpers     Care Tool Bed Mobility Roll left and right activity   Roll left and right assist level: Maximal Assistance - Patient 25 - 49%    Sit to lying activity   Sit to lying assist level: Maximal Assistance - Patient 25 - 49%    Lying to sitting edge of bed activity   Lying to sitting edge of bed assist level: Maximal  Assistance - Patient 25 - 49%     Care Tool Transfers Sit to stand transfer Sit to stand activity did not occur: Safety/medical concerns (attempted in stedy, not safe at this time)      Chair/bed transfer   Chair/bed transfer assist level: 2 Helpers (total A of 2)     Toilet transfer Toilet transfer activity did not occur: Safety/medical concerns (unsafe transfer due to severe apraxia)       Care Tool Cognition Expression of Ideas and Wants Expression of Ideas and Wants: Some difficulty - exhibits some difficulty with expressing needs and ideas (e.g, some words or finishing thoughts) or speech is not clear   Understanding Verbal and  Non-Verbal Content Understanding Verbal and Non-Verbal Content: Usually understands - understands most conversations, but misses some part/intent of message. Requires cues at times to understand   Memory/Recall Ability *first 3 days only Memory/Recall Ability *first 3 days only: That he or she is in a hospital/hospital unit    Refer to Care Plan for [place] 1 OT Short Term Goal 1 (Week 1): Pt will demonstrate improve R hand coordination to be able to bring a spoon to her mouth with 50% accuracy. OT Short Term Goal 2 (Week 1): Pt will demonstrate improved coordination and praxis to wash UB with min A. OT Short Term Goal 3 (Week 1): Pt will demonstrate improved coordination and praxis to don a shirt with max A. OT Short Term Goal 4 (Week 1): pt will be able to sit at EOB with close S for 5 minutes to prepare for a transfer. OT Short Term Goal 5 (Week 1): Pt will be able to rise to stand in a stedy lift with max A of 1 to demonstrate improved attention.  Recommendations for other services: None    Skilled Therapeutic Intervention ADL ADL Eating: Moderate assistance Grooming: Moderate assistance Upper Body Bathing: Moderate assistance Lower Body Bathing: Dependent Upper Body Dressing: Maximal assistance Lower Body  Dressing: Dependent Toileting: Not assessed Toilet Transfer: Not assessed Mobility  Bed Mobility Rolling Right: Maximal Assistance - Patient 25-49% Rolling Left: Maximal Assistance - Patient 25-49% Supine to Sit: Maximal Assistance - Patient - Patient 25-49%  Pt seen for initial evaluation with her son present.  Her son was able to clarify her PLOF and how she had returned to a mod I level after getting home from SNF and she had slower processing but her cognition was not severely impaired.  Explained role of OT to pt, pt very eager and energetic and trying a great deal.  Pt received in bed and with A from Nurse tech attempted a squat pivot transfer bed to wc.  It took numerous tries and pt is very apraxic and constantly pushing her feet out, her upper back posteriorly.  Finally did transfer her to wc , but it was NOT safe as it required total A lift from therapist and nurse tech.  Once in w/c, pt able to hold her balance fairly well and engaged in UB b/d at sink. She is quite distractable and due to ataxia and apraxia needs significant self care help.  Attempted sit to stands in a stedy with son helping as the +2.  Due to the w/c front wheels, unable to get leg pad of stedy directly adjacent to her shins.  Used pillows for support.  Pt placed B hands on bar and with max A of 2 pulled up half way but resisted coming further.  Could not put stedy pads down, attempted 3rd time and able to get them down. Pt finally got settled in stedy and placed her in front of sink mirror for visual feedback. Pt leaning to the R but self corrected with mod cues and min A.  Pt feeling very tired at this point and transferred to the bed.  +2 total A to rise to stand to move pads to allow pt to sit down.  At EOB,pt able to sit with min -mod A.  Moved into supine and assisted pt getting settled into the bed. At this point, it is NOT safe to try the stedy.  Pt could benefit from standing frame.   Son  was able to see the  challenges his mom is having and discussed current plan of care with goals of progressing pt's level of independence.  Pt resting in bed with alarm on.     Discharge Criteria: Patient will be discharged from OT if patient refuses treatment 3 consecutive times without medical reason, if treatment goals not met, if there is a change in medical status, if patient makes no progress towards goals or if patient is discharged from hospital.  The above assessment, treatment plan, treatment alternatives and goals were discussed and mutually agreed upon: by patient  [facility] 08/03/2020, 4:47 PM

## 2020-08-03 NOTE — Progress Notes (Signed)
[facility] PHYSICAL MEDICINE & REHABILITATION PROGRESS NOTE   Subjective/Complaints: No complaints this morning When asked, she says she has some chest soreness Denies constipation Does not need IV  ROS: + pain, denies constipation, insomnia   Objective:   VAS US CAROTID  Result Date: 08/01/2020 Carotid Arterial Duplex Study Indications:       CVA. Comparison Study:  No prior study Performing Technologist: Maudry Mayhew MHA, RDMS, RVT, RDCS  Examination Guidelines: A complete evaluation includes B-mode imaging, spectral Doppler, color Doppler, and power Doppler as needed of all accessible portions of each vessel. Bilateral testing is considered an integral part of a complete examination. Limited examinations for reoccurring indications may be performed as noted.  Right Carotid Findings: +----------+--------+--------+--------+--------------------------+--------+           PSV cm/sEDV cm/sStenosisPlaque Description        Comments +----------+--------+--------+--------+--------------------------+--------+ CCA Prox  49      8                                                  +----------+--------+--------+--------+--------------------------+--------+ CCA Distal41      7               smooth and heterogenous            +----------+--------+--------+--------+--------------------------+--------+ ICA Prox  27      9               smooth and heterogenous            +----------+--------+--------+--------+--------------------------+--------+ ICA Distal68      15                                                 +----------+--------+--------+--------+--------------------------+--------+ ECA       69      6               heterogenous and irregular         +----------+--------+--------+--------+--------------------------+--------+ +----------+--------+-------+----------------+-------------------+           PSV cm/sEDV cmsDescribe        Arm Pressure (mmHG)  +----------+--------+-------+----------------+-------------------+ [id number]             Multiphasic, WNL                    +----------+--------+-------+----------------+-------------------+ +---------+--------+--+--------+-+---------+ VertebralPSV cm/s31EDV cm/s7Antegrade +---------+--------+--+--------+-+---------+  Left Carotid Findings: +----------+-------+-------+--------+---------------------------------+--------+           PSV    EDV    StenosisPlaque Description               Comments           cm/s   cm/s                                                     +----------+-------+-------+--------+---------------------------------+--------+ CCA Prox  68     10                                                       +----------+-------+-------+--------+---------------------------------+--------+  CCA Distal63     13             smooth and heterogenous                   +----------+-------+-------+--------+---------------------------------+--------+ ICA Prox  177    51             heterogenous, irregular and                                               calcific                                  +----------+-------+-------+--------+---------------------------------+--------+ ICA Distal72     22                                                       +----------+-------+-------+--------+---------------------------------+--------+ ECA       67     10             calcific                                  +----------+-------+-------+--------+---------------------------------+--------+ +----------+--------+--------+----------------+-------------------+           PSV cm/sEDV cm/sDescribe        Arm Pressure (mmHG) +----------+--------+--------+----------------+-------------------+ [id number]             Multiphasic, WNL                    +----------+--------+--------+----------------+-------------------+  +---------+--------+--+--------+-+---------+ VertebralPSV cm/s48EDV cm/s7Antegrade +---------+--------+--+--------+-+---------+   Summary: Right Carotid: Velocities in the right ICA are consistent with a 1-39% stenosis. Left Carotid: Velocities in the left ICA are consistent with a 40-59% stenosis. Vertebrals:  Bilateral vertebral arteries demonstrate antegrade flow. Subclavians: Normal flow hemodynamics were seen in bilateral subclavian              arteries. *See table(s) above for measurements and observations.  Electronically signed by Monica Martinez MD on 08/01/2020 at 3:29:12 PM.    Final    Recent Labs    08/01/20 0523  WBC 8.9  HGB 9.1*  HCT 28.0*  PLT 150   Recent Labs    08/01/20 0523 08/02/20 0410  NA 139 142  K 3.8 3.9  CL 104 106  CO2 25 25  GLUCOSE 131* 117*  BUN 53* 48*  CREATININE 1.75* 1.44*  CALCIUM 8.6* 8.6*    Intake/Output Summary (Last 24 hours) at 08/03/2020 1157 Last data filed at 08/02/2020 1831 Gross per 24 hour  Intake 240 ml  Output --  Net 240 ml     Physical Exam: Vital Signs Blood pressure (!) 153/78, pulse 62, temperature 98.4 F (36.9 C), temperature source Oral, resp. rate 17, height 5\' 8"  (1.727 m), weight 54.7 kg, SpO2 97 %.  General: Alert and oriented x 3, No apparent distress HEENT: Head is normocephalic, atraumatic, PERRLA, EOMI, sclera anicteric, oral mucosa pink and moist, dentition intact, ext ear canals clear,  Neck: Supple without JVD or lymphadenopathy Heart: Reg rate and rhythm. No murmurs rubs or gallops Chest: CTA bilaterally without  wheezes, rales, or rhonchi; no distress Abdomen: Soft, non-tender, non-distended, bowel sounds positive. Extremities: No clubbing, cyanosis, or edema. Pulses are 2+ Skin: Clean and intact without signs of breakdown Musculoskeletal:     Cervical back: Normal range of motion and neck supple.     Comments: No edema or tenderness in extremities  Skin:    General: Skin is warm and dry.   Neurological:     Mental Status: She is alert.     Comments: Alert Oriented to name but not year.   She does have some difficulty naming objects with decrease in recall.   Follows simple commands. Motor: RUE: 3/5 proximal distally LUE: 4+/5 proximal distal Bilateral lower extremities: 4+/5 distal  Psychiatric:        Speech: Speech is tangential.        Cognition and Memory: Cognition is impaired. Memory is impaired.      Assessment/Plan: 1. Functional deficits secondary to right sided cerebral infarction which require 3+ hours per day of interdisciplinary therapy in a comprehensive inpatient rehab setting.  Physiatrist is providing close team supervision and 24 hour management of active medical problems listed below.  Physiatrist and rehab team continue to assess barriers to discharge/monitor patient progress toward functional and medical goals  Care Tool:  Bathing              Bathing assist       Upper Body Dressing/Undressing Upper body dressing        Upper body assist      Lower Body Dressing/Undressing Lower body dressing            Lower body assist       Toileting Toileting    Toileting assist       Transfers Chair/bed transfer  Transfers assist           Locomotion Ambulation   Ambulation assist              Walk 10 feet activity   Assist           Walk 50 feet activity   Assist           Walk 150 feet activity   Assist           Walk 10 feet on uneven surface  activity   Assist           Wheelchair     Assist               Wheelchair 50 feet with 2 turns activity    Assist            Wheelchair 150 feet activity     Assist          Blood pressure (!) 153/78, pulse 62, temperature 98.4 F (36.9 C), temperature source Oral, resp. rate 17, height 5\' 8"  (1.727 m), weight 54.7 kg, SpO2 97 %.  Medical Problem List and Plan: 1.  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  Antithrombotics: -DVT/anticoagulation: Intravenous heparin transitioned back to Eliquis             -  antiplatelet therapy: N/A 3. Pain Management: Neurontin 300 mg twice daily  8/28: Denies pain. Decrease Gabapentin to just HS dose.  4. Mood: Provide emotional support             -antipsychotic agents: N/A 5. Neuropsych: This patient is not capable of making decisions on her own behalf. 6. Skin/Wound Care: Routine skin checks 7. Fluids/Electrolytes/Nutrition: Routine in and outs.  CMP ordered. 8.  Atrial fibrillation.  Amiodarone 200 mg twice daily, Toprol-XL 25 mg daily             Monitor with increased exertion. 9.  CAD/non-STEMI.  Follow-up cardiology services 10.  Type 2 diabetes mellitus.  Hemoglobin A1c 6.2.  Patient on Glucophage 500 mg twice daily prior to admission.  Resume as needed  8/28: will not restart metformin yet given AKI             Monitor with increased mobility. 11.  Acute on chronic systolic congestive heart failure.  Lasix 40 mg daily.  Monitor for any signs of fluid overload             Daily weights. 12.  History of CEA as well as left carotid stenosis.  Follow-up vascular surgery 13.  Hyperlipidemia: Lipitor 14.  AKI.              Creatinine improving.    LOS: 1 days A FACE TO FACE EVALUATION WAS PERFORMED  Clide Deutscher Elaine Middleton 08/03/2020, 11:57 AM

## 2020-08-03 NOTE — Plan of Care (Signed)
  Problem: RH Balance Goal: LTG Patient will maintain dynamic sitting balance (PT) Description: LTG:  Patient will maintain dynamic sitting balance with assistance during mobility activities (PT) Flowsheets (Taken 08/03/2020 1830) LTG: Pt will maintain dynamic sitting balance during mobility activities with:: Minimal Assistance - Patient > 75% Goal: LTG Patient will maintain dynamic standing balance (PT) Description: LTG:  Patient will maintain dynamic standing balance with assistance during mobility activities (PT) Flowsheets (Taken 08/03/2020 1830) LTG: Pt will maintain dynamic standing balance during mobility activities with:: Moderate Assistance - Patient 50 - 74%   Problem: RH Bed Mobility Goal: LTG Patient will perform bed mobility with assist (PT) Description: LTG: Patient will perform bed mobility with assistance, with/without cues (PT). Flowsheets (Taken 08/03/2020 1830) LTG: Pt will perform bed mobility with assistance level of: Moderate Assistance - Patient 50 - 74%   Problem: RH Bed to Chair Transfers Goal: LTG Patient will perform bed/chair transfers w/assist (PT) Description: LTG: Patient will perform bed to chair transfers with assistance (PT). Flowsheets (Taken 08/03/2020 1830) LTG: Pt will perform Bed to Chair Transfers with assistance level: Moderate Assistance - Patient 50 - 74%   Problem: RH Car Transfers Goal: LTG Patient will perform car transfers with assist (PT) Description: LTG: Patient will perform car transfers with assistance (PT). Flowsheets (Taken 08/03/2020 1830) LTG: Pt will perform car transfers with assist:: Moderate Assistance - Patient 50 - 74%   Problem: RH Ambulation Goal: LTG Patient will ambulate in controlled environment (PT) Description: LTG: Patient will ambulate in a controlled environment, # of feet with assistance (PT). Flowsheets (Taken 08/03/2020 1830) LTG: Pt will ambulate in controlled environ  assist needed:: Moderate Assistance - Patient  50 - 74% LTG: Ambulation distance in controlled environment: 61ft with PT and LRAD for force use of BLE.   Problem: RH Wheelchair Mobility Goal: LTG Patient will propel w/c in controlled environment (PT) Description: LTG: Patient will propel wheelchair in controlled environment, # of feet with assist (PT) Flowsheets (Taken 08/03/2020 1830) LTG: Pt will propel w/c in controlled environ  assist needed:: Minimal Assistance - Patient > 75% LTG: Propel w/c distance in controlled environment: 175ft Goal: LTG Patient will propel w/c in home environment (PT) Description: LTG: Patient will propel wheelchair in home environment, # of feet with assistance (PT). Flowsheets (Taken 08/03/2020 1830) LTG: Pt will propel w/c in home environ  assist needed:: Minimal Assistance - Patient > 75% Distance: wheelchair distance in controlled environment: 25

## 2020-08-03 NOTE — Evaluation (Signed)
Speech Language Pathology Assessment and Plan  Patient Details  Name: Shawna Curry MRN: [id number] Date of Birth: July 15, 1933  SLP Diagnosis: Cognitive Impairments  Rehab Potential: Good ELOS: 12-16 days    Today's Date: 08/03/2020 SLP Individual Time: 1010-1110 SLP Individual Time Calculation (min): 60 min   Hospital Problem: Principal Problem:   Right sided cerebral infarction [facility])  Past Medical History:  Past Medical History:  Diagnosis Date  . Atrial fibrillation ([place])   . Coronary artery disease   . Hypertension    Past Surgical History:  Past Surgical History:  Procedure Laterality Date  . ABDOMINAL HYSTERECTOMY    . HAND TENDON SURGERY    . JOINT REPLACEMENT     R hip x 2  . PERCUTANEOUS PLACEMENT INTRAVASCULAR STENT CERVICAL CAROTID ARTERY    . TONSILLECTOMY      Assessment / Plan / Recommendation Clinical Impression   Shawna Curry is an 86 year old right-handed female with history of atrial fibrillation maintained on Eliquis as well as Plavix, hyperlipidemia, DVT of tibial vein, type 2 diabetes mellitus, PCA infarction with residual right-sided weakness, CAD/non-STEMI 02/2020 followed by Dr.Rohrbeck with [facility], history of right CEA, left carotid stenosis  followed by vascular surgery, acute on chronic systolic congestive heart failure.  History taken from chart review due to cognition.  Patient lives alone 1 level home with ramped entrance.  Ambulates with a rolling walker.  She has good family support with family alternating schedules to provide assistance.  Earlier this year patient went to the hospital on multiple occasions short stay in skilled nursing facility was receiving first outpatient therapy and then home health therapy.  She presented on 07/25/2020 with SOB.  Noted to be hypotensive became unresponsive in the ED requiring intubation/PEA arrest 5 minutes of CPR given epinephrine, bicarb also with given.  EKG showed interval T waves in V5  V6 with 1 mm of elevation in V4 and placed on intravenous amiodarone as well as IV heparin.  EEG negative for seizures, nonspecific generalized cerebral dysfunction encephalopathy no seizure activity.  Carotid Dopplers with 40 to 59% left ICA stenosis.  Admission chemistries potassium 5.2, BUN 51 creatinine 1.90 troponin 26-29, lactic acid 2.8, BNP [phone].  Initial echocardiogram with ejection fraction of 40-45%.  The left ventricle demonstrating global hypokinesis and echocardiogram was repeated 07/31/2020 showing ejection fraction 35 to 40% mild concentric left ventricular hypertrophy moderately elevated pulmonary artery systolic pressure.Marland Kitchen  She was extubated 07/29/2020 aspirin initiated 07/30/2020.  Close monitor renal function elevated 2.31 and improved with gentle hydration latest creatinine 1.75.  MRI of the brain completed 07/30/2020 after extubation showed a small acute posterior right cerebral infarction advised to  Resume home Eliquis per Neurology and initial aspirin was then discontinued.  Patient is on mechanical soft diet.  Therapy evaluations completed and patient was admitted for a comprehensive rehab program.  Please see preadmission assessment from earlier today as well.  Pt presents with moderate-severe cognitive impairments, deficits include immediate/short term recall, orientation to time/situation, basic problem solving, sustained attention, safety awareness and intermittent intellectual awareness. Formal cognitive linguistic assessment SLUMS indicated severe impairment with a score of 5 out 30 (n=> 27).  Pt demonstrated reduced awareness of deficits and impact of deficits on daily function and personal safety. Increase awareness of cognitive deficits were noted upon reflection of performance. Pt demonstrated ability to use and locate the call bell when prompted, but later in the session when requesting to use the bathroom was unable to locate/recall call bell  protocol. Pt expressed  intermittent unsafe actions when given functional scenarios pertaining to ambulating within room. Pt demonstrated ability to read at phrase level and read calendar with large print, even without glasses (not present in room.) SLP noted occasional language of confusion or possible word finding difficulties in conversation which were self-corrected by patient and likely impacted by cognitive deficits in attention and memory. No reports of swallowing issues and speech appeared [facility]. Pt would benefit from skilled ST services in order to maximize functional independence and reduce burden of care, likely requiring supervision at discharge with continued skilled ST services.   Skilled Therapeutic Interventions          Skilled ST services focused on cognitive skills. SLP facilitated administration of cognitive linguistic formal assessment SLUMS and provided education of results. Pt demonstrated intermittent safety awareness and basic problem solving skills to utilize call bell. SLP provided instruction and pt returned demonstration and recall of protocol with max A verbal cues. SLP provided a calendar posted in room to aid in orientation, pt required mod A verbal cues to locate items on calendar when held up close.  SLP and pt collaborated to set goals for cognitive linguistic needs during length of stay. All questions answered to satisfaction.  Pt was left in room with call bell within reach and bed alarm set. SLP recommends to continue skilled services.  SLP Assessment  Patient will need skilled Speech Lanaguage Pathology Services during CIR admission    Recommendations  Medication Administration: Whole meds with liquid Compensations: Slow rate;Small sips/bites Postural Changes and/or Swallow Maneuvers: Seated upright 90 degrees Oral Care Recommendations: Oral care BID Patient destination: Home Follow up Recommendations: 24 hour supervision/assistance;Home Health SLP Equipment Recommended: None recommended by  SLP    SLP Frequency 3 to 5 out of 7 days   SLP Duration  SLP Intensity  SLP Treatment/Interventions 12-16 days  Minumum of 1-2 x/day, 30 to 90 minutes  Cognitive remediation/compensation;Cueing hierarchy;Functional tasks;Patient/family education;Internal/external aids    Pain Pain Assessment Pain Score: 0-No pain  Prior Functioning Cognitive/Linguistic Baseline: Information not available Type of Home: House  Lives With: Alone Available Help at Discharge: Family;Available 24 hours/day Education: high school and beauty school  SLP Evaluation Cognition Overall Cognitive Status: Impaired/Different from baseline Orientation Level: Oriented to person;Oriented to place;Disoriented to time;Disoriented to situation Attention: Sustained Sustained Attention: Impaired Sustained Attention Impairment: Functional basic;Verbal complex Memory: Impaired Memory Impairment: Storage deficit;Decreased recall of new information Awareness: Impaired Awareness Impairment: Intellectual impairment;Emergent impairment (safety awareness) Problem Solving: Impaired Problem Solving Impairment: Verbal basic;Functional basic Safety/Judgment: Impaired Comments: poor awareness to situation and deficits  Comprehension Auditory Comprehension Overall Auditory Comprehension: Appears within functional limits for tasks assessed Yes/No Questions: Within Functional Limits Conversation: Simple Visual Recognition/Discrimination Discrimination: Within Function Limits Reading Comprehension Reading Status: Within funtional limits (phrase level llarge print, reading glasses not present) Expression Expression Primary Mode of Expression: Verbal Verbal Expression Overall Verbal Expression: Appears within functional limits for tasks assessed Written Expression Dominant Hand: Right Written Expression: Not tested Oral Motor Oral Motor/Sensory Function Overall Oral Motor/Sensory Function: Within functional  limits Motor Speech Overall Motor Speech: Appears within functional limits for tasks assessed Respiration: Within functional limits Phonation: Normal Resonance: Within functional limits Articulation: Within functional limitis Intelligibility: Intelligible Motor Planning: Witnin functional limits Motor Speech Errors: Not applicable  Care Tool Care Tool Cognition Expression of Ideas and Wants Expression of Ideas and Wants: Some difficulty - exhibits some difficulty with expressing needs and ideas (e.g, some words or finishing thoughts)  or speech is not clear   Understanding Verbal and Non-Verbal Content Understanding Verbal and Non-Verbal Content: Usually understands - understands most conversations, but misses some part/intent of message. Requires cues at times to understand   Memory/Recall Ability *first 3 days only       Short Term Goals: Week 1: SLP Short Term Goal 1 (Week 1): Pt will demonstrate orientation to place, time and situtation with mod A verbal cues. SLP Short Term Goal 2 (Week 1): Pt will demonstrate daily and novel recall with external aids with mod A verbal cues. SLP Short Term Goal 3 (Week 1): Pt will demonstrate functional and basic problem solving with mod A verbal cues. SLP Short Term Goal 4 (Week 1): Pt will demonstrate 2 safety precautions due to acute CVA deficits with mod A verbal cues. SLP Short Term Goal 5 (Week 1): Pt will demonstrate sustained attention in 15 minute interval with min A verbal cues for redirection to functional task.  Refer to Care Plan for Long Term Goals  Recommendations for other services: None   Discharge Criteria: Patient will be discharged from SLP if patient refuses treatment 3 consecutive times without medical reason, if treatment goals not met, if there is a change in medical status, if patient makes no progress towards goals or if patient is discharged from hospital.  The above assessment, treatment plan, treatment alternatives  and goals were discussed and mutually agreed upon: by patient  Kairen Hallinan  [facility] 08/03/2020, 1:41 PM

## 2020-08-03 NOTE — Evaluation (Signed)
Physical Therapy Assessment and Plan  Patient Details  Name: Shawna Curry MRN: [id number] Date of Birth: [birth year]  PT Diagnosis: Abnormal posture, Abnormality of gait, Ataxia, Ataxic gait, Coordination disorder, Hemiplegia dominant, Impaired cognition, Impaired sensation and Muscle weakness Rehab Potential: Fair ELOS: 3-4 weeks   Today's Date: 08/03/2020 PT Individual Time: 0800-0900 PT Individual Time Calculation (min): 60 min    Hospital Problem: Principal Problem:   Right sided cerebral infarction [facility])   Past Medical History:  Past Medical History:  Diagnosis Date  . Atrial fibrillation ([place])   . Coronary artery disease   . Hypertension    Past Surgical History:  Past Surgical History:  Procedure Laterality Date  . ABDOMINAL HYSTERECTOMY    . HAND TENDON SURGERY    . JOINT REPLACEMENT     R hip x 2  . PERCUTANEOUS PLACEMENT INTRAVASCULAR STENT CERVICAL CAROTID ARTERY    . TONSILLECTOMY      Assessment & Plan Clinical Impression: Patient is a 85 year old right-handed female with history of atrial fibrillation maintained on Eliquis as well as Plavix, hyperlipidemia, DVT of tibial vein, type 2 diabetes mellitus, PCA infarction with residual right-sided weakness, CAD/non-STEMI 02/2020 followed by Dr.Rohrbeck with [facility], history of right CEA, left carotid stenosis  followed by vascular surgery, acute on chronic systolic congestive heart failure.  History taken from chart review due to cognition.  Patient lives alone 1 level home with ramped entrance.  Ambulates with a rolling walker.  She has good family support with family alternating schedules to provide assistance.  Earlier this year patient went to the hospital on multiple occasions short stay in skilled nursing facility was receiving first outpatient therapy and then home health therapy.  She presented on 07/25/2020 with SOB.  Noted to be hypotensive became unresponsive in the ED requiring intubation/PEA  arrest 5 minutes of CPR given epinephrine, bicarb also with given.  EKG showed interval T waves in V5 V6 with 1 mm of elevation in V4 and placed on intravenous amiodarone as well as IV heparin.  EEG negative for seizures, nonspecific generalized cerebral dysfunction encephalopathy no seizure activity.  Carotid Dopplers with 40 to 59% left ICA stenosis.  Admission chemistries potassium 5.2, BUN 51 creatinine 1.90 troponin 26-29, lactic acid 2.8, BNP [id number].  Initial echocardiogram with ejection fraction of 40-45%.  The left ventricle demonstrating global hypokinesis and echocardiogram was repeated 07/31/2020 showing ejection fraction 35 to 40% mild concentric left ventricular hypertrophy moderately elevated pulmonary artery systolic pressure.Marland Kitchen  She was extubated 07/29/2020 aspirin initiated 07/30/2020.  Close monitor renal function elevated 2.31 and improved with gentle hydration latest creatinine 1.75.  MRI of the brain completed 07/30/2020 after extubation showed a small acute posterior right cerebral infarction advised to  Resume home Eliquis per Neurology and initial aspirin was then discontinued.  Patient is on mechanical soft diet.   Patient transferred to CIR on 08/02/2020 .   Patient currently requires total with mobility secondary to muscle weakness and muscle joint tightness, decreased cardiorespiratoy endurance, unbalanced muscle activation, motor apraxia, ataxia, decreased coordination and decreased motor planning, decreased visual acuity, decreased visual perceptual skills and decreased visual motor skills, decreased attention to right and ideational apraxia, decreased initiation, decreased attention, decreased awareness, decreased problem solving, decreased safety awareness, decreased memory and delayed processing and decreased sitting balance, decreased standing balance, decreased postural control, hemiplegia and decreased balance strategies.  Prior to hospitalization, patient was modified independent   with mobility and lived with Alone in a House home.  Home access is  Ramped entrance.  Patient will benefit from skilled PT intervention to maximize safe functional mobility, minimize fall risk and decrease caregiver burden for planned discharge home with 24 hour assist.  Anticipate patient will benefit from SNF follow up  at discharge.  PT - End of Session Activity Tolerance: Tolerates < 10 min activity, no significant change in vital signs Endurance Deficit: Yes PT Assessment Rehab Potential (ACUTE/IP ONLY): Fair PT Barriers to Discharge: [place] home environment;Decreased caregiver support;Home environment access/layout;Lack of/limited family support;Insurance for SNF coverage;Behavior PT Patient demonstrates impairments in the following area(s): Balance;Behavior;Endurance;Motor;Pain;Nutrition;Perception;Safety;Sensory;Skin Integrity PT Transfers Functional Problem(s): Bed Mobility;Bed to Chair;Car;Furniture;Floor PT Locomotion Functional Problem(s): Ambulation;Wheelchair Mobility;Stairs PT Plan PT Intensity: Minimum of 1-2 x/day ,45 to 90 minutes PT Frequency: 5 out of 7 days PT Duration Estimated Length of Stay: 3-4 weeks PT Treatment/Interventions: Ambulation/gait training;DME/adaptive equipment instruction;Neuromuscular re-education;Community reintegration;Psychosocial support;Wheelchair propulsion/positioning;UE/LE Strength taining/ROM;Stair training;Balance/vestibular training;Discharge planning;Functional electrical stimulation;Pain management;Skin care/wound management;Therapeutic Activities;UE/LE Coordination activities;Cognitive remediation/compensation;Disease management/prevention;Functional mobility training;Patient/family education;Splinting/orthotics;Therapeutic Exercise;Visual/perceptual remediation/compensation PT Transfers Anticipated Outcome(s): Mod Assist with LRAD to and from [facility] PT Locomotion Anticipated Outcome(s): Min assist WC mobility for short distances. PT  Recommendation Follow Up Recommendations: Skilled nursing facility Patient destination: Home Equipment Recommended: To be determined;Wheelchair cushion (measurements);Wheelchair (measurements);Sliding board   PT Evaluation Precautions/Restrictions Precautions Precautions: Fall Precaution Comments: truncal and bil UE ataxia, severely impaired motor planning General   Vital Signs Pain Pain Assessment Pain Score: 0-No pain Home Living/Prior Functioning Home Living Available Help at Discharge: Family;Available 24 hours/day Type of Home: House Home Access: Ramped entrance Home Layout: One level Bathroom Shower/Tub: Multimedia programmer: Standard Additional Comments: son clarified that pt lives alone and the kids rotate coming to stay with her  Lives With: Alone Prior Function Level of Independence: Requires assistive device for independence;Independent with homemaking with ambulation;Independent with basic ADLs;Independent with gait  Able to Take Stairs?: Yes Driving: Yes Vocation: Retired Comments: son and daughter alternate staying with pt; pt loves to cook and bake Vision/Perception  Vision - Assessment Additional Comments: to be assessed further, no significant deficits noted on eval Perception Perception: Impaired Inattention/Neglect: Does not attend to right visual field;Does not attend to right side of body Spatial Orientation: midline orientation severely impaired Praxis Praxis: Impaired Praxis Impairment Details: Motor planning;Ideomotor;Ideation Praxis-Other Comments: severely impaired  Cognition Arousal/Alertness: Awake/alert Memory: Impaired Memory Impairment: Storage deficit;Decreased recall of new information;Retrieval deficit;Decreased short term memory;Decreased long term memory Decreased Long Term Memory: Verbal basic;Functional basic Decreased Short Term Memory: Verbal basic;Functional basic Immediate Memory Recall: Sock;Blue;Bed Memory Recall  Sock: Not able to recall Memory Recall Blue: Not able to recall Memory Recall Bed: Not able to recall Sensation Sensation Light Touch: Impaired Detail Peripheral sensation comments: base line neuropathy in BLE Hot/Cold: Appears Intact Proprioception: Impaired Detail Proprioception Impaired Details: Impaired LUE;Impaired RUE;Impaired RLE;Impaired LLE Stereognosis: Impaired by gross assessment Additional Comments: Poor proprioception and kinesthesia in all 4 extremities with increased deficits on the R. Coordination Gross Motor Movements are Fluid and Coordinated: No Fine Motor Movements are Fluid and Coordinated: No Coordination and Movement Description: ataxia R>L in UE/LE Finger Nose Finger Test: unable to follow directions for 2 step test, pt kept trying to reach her left hand up and then grab R hand and reach overhead Heel Shin Test: severe ataxia in the RLE, moderate on the LLE Motor  Motor Motor: Hemiplegia;Ataxia;Motor apraxia;Abnormal postural alignment and control Motor - Skilled Clinical Observations: ataxia in all 4 extremities R>L and mild hemiplegia RUE>RLE   Trunk/Postural  Assessment  Postural Control Postural Control: Deficits on evaluation Righting Reactions: limited Protective Responses: delayed. Postural Limitations: posterior Bias and R lateral lean  Balance Static Sitting Balance Static Sitting - Level of Assistance: 3: Mod assist Dynamic Sitting Balance Dynamic Sitting - Level of Assistance: 1: +1 Total assist Static Standing Balance Static Standing - Level of Assistance: Not tested (comment) (unable to safely come to stand in stedy lift) Extremity Assessment  RUE Assessment Active Range of Motion (AROM) Comments: sh flexion to 45 degrees, but pt will use L hand to get R arm "started" and then she can lift arm up General Strength Comments: grossly 4/5 with poor coordination LUE Assessment General Strength Comments: grossly 4+/5 RLE Assessment RLE  Assessment: Exceptions to [facility] General Strength Comments: grossly 4/5 with poor proprioception LLE Assessment LLE Assessment: Exceptions to [facility] General Strength Comments: grossly 4/5 with poor proprioception  Care Tool Care Tool Bed Mobility Roll left and right activity   Roll left and right assist level: Total Assistance - Patient < 25%    Sit to lying activity   Sit to lying assist level: Total Assistance - Patient < 25%    Lying to sitting edge of bed activity   Lying to sitting edge of bed assist level: Dependent - Patient 0%     Care Tool Transfers Sit to stand transfer Sit to stand activity did not occur: Safety/medical concerns (attempted in stedy, not safe at this time)      Chair/bed transfer   Chair/bed transfer assist level: Dependent - Patient 0%     Toilet transfer Toilet transfer activity did not occur: Safety/medical concerns      Scientist, product/process development transfer activity did not occur: Safety/medical concerns        Care Tool Locomotion Ambulation Ambulation activity did not occur: Safety/medical concerns        Walk 10 feet activity Walk 10 feet activity did not occur: Safety/medical concerns       Walk 50 feet with 2 turns activity Walk 50 feet with 2 turns activity did not occur: Safety/medical concerns      Walk 150 feet activity Walk 150 feet activity did not occur: Safety/medical concerns      Walk 10 feet on uneven surfaces activity Walk 10 feet on uneven surfaces activity did not occur: Safety/medical concerns      Stairs Stair activity did not occur: Safety/medical concerns        Walk up/down 1 step activity Walk up/down 1 step or curb (drop down) activity did not occur: Safety/medical concerns     Walk up/down 4 steps activity did not occuR: Safety/medical concerns  Walk up/down 4 steps activity      Walk up/down 12 steps activity Walk up/down 12 steps activity did not occur: Safety/medical concerns      Pick up small objects from  floor Pick up small object from the floor (from standing position) activity did not occur: Safety/medical concerns      Wheelchair       Wheelchair assist level: Dependent - Patient 0% Max wheelchair distance: 150  Wheel 50 feet with 2 turns activity   Assist Level: Dependent - Patient 0%  Wheel 150 feet activity   Assist Level: Dependent - Patient 0%    Refer to Care Plan for Long Term Goals  SHORT TERM GOAL WEEK 1 PT Short Term Goal 1 (Week 1): pt will sit EOB with min assist up to 5 minutes PT Short Term Goal 2 (Week  1): Pt will trasnfer to and from [facility] with max assist of 1 PT Short Term Goal 3 (Week 1): Pt will perform bed mobility with max assist PT Short Term Goal 4 (Week 1): Pt will initated self propelled WC mobility  Recommendations for other services: None   Skilled Therapeutic Intervention  Pt received supine in bed and agreeable to PT. Supine>sit transfer with max-total A. Sitting balance EOB with mod-max assist to prevent posterior LOB x 10 minutes. PT instructed patient in PT Evaluation and initiated treatment intervention; see below for results. PT educated patient in [place], rehab potential, rehab goals, and discharge recommendations. Sit<>stand with total-dependent level assist from PT due to severe motor planning and proprioceptive deficits. Sit<>stand in stedy with total A+2 for safety but unable to generate adequate hip extension due to posterior pushing tendencies. Gait training at // bars with max +2 assist with WC follow, As noted below.  Pt returned to room and performed squat pivot transfer to bed with total A +2. Sit>supine completed with total A and left supine in bed with call bell in reach and all needs met.       Mobility Bed Mobility Rolling Right: Maximal Assistance - Patient 25-49% Rolling Left: Maximal Assistance - Patient 25-49% Supine to Sit: Maximal Assistance - Patient - Patient 25-49% Locomotion  Gait Ambulation: Yes Gait Assistance: 2  Helpers Assistive device: Parallel bars Gait Assistance Details: Verbal cues for gait pattern;Verbal cues for technique;Verbal cues for precautions/safety;Manual facilitation for placement;Manual facilitation for weight shifting Gait Gait: Yes Gait Pattern: Impaired Gait Pattern: Ataxic;Narrow base of support;Scissoring Gait velocity: posterior bias, with increasing wind-swept position Stairs / Additional Locomotion Stairs: No Wheelchair Mobility Wheelchair Mobility: Yes Wheelchair Assistance: Dependent - Patient 0% Wheelchair Parts Management: Needs assistance Distance: 150   Discharge Criteria: Patient will be discharged from PT if patient refuses treatment 3 consecutive times without medical reason, if treatment goals not met, if there is a change in medical status, if patient makes no progress towards goals or if patient is discharged from hospital.  The above assessment, treatment plan, treatment alternatives and goals were discussed and mutually agreed upon: by patient  Lorie Phenix 08/03/2020, 6:29 PM

## 2020-08-04 LAB — GLUCOSE, CAPILLARY
Glucose-Capillary: 124 mg/dL — ABNORMAL HIGH (ref 70–99)
Glucose-Capillary: 163 mg/dL — ABNORMAL HIGH (ref 70–99)
Glucose-Capillary: 66 mg/dL — ABNORMAL LOW (ref 70–99)
Glucose-Capillary: 57 mg/dL — ABNORMAL LOW (ref 70–99)
Glucose-Capillary: 73 mg/dL (ref 70–99)

## 2020-08-04 MED ORDER — GLUCOSE 40 % PO GEL
ORAL | Status: AC
  Filled 2020-08-04: qty 1

## 2020-08-04 MED ORDER — LATANOPROST 0.005 % OP SOLN
1.0000 [drp] | Freq: Every day | OPHTHALMIC | Status: DC
  Administered 2020-08-04 – 2020-08-20 (×17): 1 [drp] via OPHTHALMIC
  Filled 2020-08-04: qty 2.5

## 2020-08-04 NOTE — Significant Event (Signed)
Hypoglycemic Event  CBG: 57  Treatment: 1 tube glucose gel  Symptoms: None  Follow-up CBG: Time 1700 CBG Result: 73  Possible Reasons for Event: Unknown   Shawna Curry

## 2020-08-04 NOTE — Significant Event (Signed)
Hypoglycemic Event  CBG: 66  Treatment: 8 oz juice/soda  Symptoms: None  Follow-up CBG: Time: 1645 CBG Result: 57  Possible Reasons for Event: Unknown   Ate 100% of lunch, OJ given first time, glucose given second time, will post separate note with follow up [place]

## 2020-08-04 NOTE — Progress Notes (Signed)
[facility] PHYSICAL MEDICINE & REHABILITATION PROGRESS NOTE   Subjective/Complaints: No complaints. Ate most of lunch.  Denies pain.  IV out  ROS: denies constipation, insomnia, pain   Objective:   No results found. No results for input(s): WBC, HGB, HCT, PLT in the last 72 hours. Recent Labs    08/02/20 0410  NA 142  K 3.9  CL 106  CO2 25  GLUCOSE 117*  BUN 48*  CREATININE 1.44*  CALCIUM 8.6*    Intake/Output Summary (Last 24 hours) at 08/04/2020 1405 Last data filed at 08/04/2020 0751 Gross per 24 hour  Intake 520 ml  Output --  Net 520 ml     Physical Exam: Vital Signs Blood pressure 133/66, pulse 63, temperature 98.2 F (36.8 C), resp. rate 17, height 5\' 8"  (1.727 m), weight 54.7 kg, SpO2 97 %. General: Alert and oriented x 3, No apparent distress HEENT: Head is normocephalic, atraumatic, PERRLA, EOMI, sclera anicteric, oral mucosa pink and moist, dentition intact, ext ear canals clear,  Neck: Supple without JVD or lymphadenopathy Heart: Reg rate and rhythm. No murmurs rubs or gallops Chest: CTA bilaterally without wheezes, rales, or rhonchi; no distress Abdomen: Soft, non-tender, non-distended, bowel sounds positive. Extremities: No clubbing, cyanosis, or edema. Pulses are 2+ Musculoskeletal:     Cervical back: Normal range of motion and neck supple.     Comments: No edema or tenderness in extremities  Skin:    General: Skin is warm and dry.  Neurological:     Mental Status: She is alert.     Comments: Alert Oriented to name but not year.   She does have some difficulty naming objects with decrease in recall.   Follows simple commands. Motor: RUE: 3/5 proximal distally LUE: 4+/5 proximal distal Bilateral lower extremities: 4+/5 distal  Psychiatric:        Speech: Speech is tangential.        Cognition and Memory: Cognition is impaired. Memory is impaired.       Assessment/Plan: 1. Functional deficits secondary to right sided cerebral  infarction which require 3+ hours per day of interdisciplinary therapy in a comprehensive inpatient rehab setting.  Physiatrist is providing close team supervision and 24 hour management of active medical problems listed below.  Physiatrist and rehab team continue to assess barriers to discharge/monitor patient progress toward functional and medical goals  Care Tool:  Bathing    Body parts bathed by patient: Abdomen, Chest, Face   Body parts bathed by helper: Right arm, Left arm, Front perineal area, Buttocks, Right upper leg, Left upper leg, Right lower leg, Left lower leg     Bathing assist Assist Level: Maximal Assistance - Patient 24 - 49%     Upper Body Dressing/Undressing Upper body dressing   What is the patient wearing?: Hospital gown only    Upper body assist Assist Level: Total Assistance - Patient < 25%    Lower Body Dressing/Undressing Lower body dressing      What is the patient wearing?: Incontinence brief     Lower body assist Assist for lower body dressing: Total Assistance - Patient < 25%     Toileting Toileting    Toileting assist Assist for toileting: 2 Helpers     Transfers Chair/bed transfer  Transfers assist     Chair/bed transfer assist level: Dependent - Patient 0%     Locomotion Ambulation   Ambulation assist   Ambulation activity did not occur: Safety/medical concerns  Walk 10 feet activity   Assist  Walk 10 feet activity did not occur: Safety/medical concerns        Walk 50 feet activity   Assist Walk 50 feet with 2 turns activity did not occur: Safety/medical concerns         Walk 150 feet activity   Assist Walk 150 feet activity did not occur: Safety/medical concerns         Walk 10 feet on uneven surface  activity   Assist Walk 10 feet on uneven surfaces activity did not occur: Safety/medical concerns         Wheelchair     Assist        Wheelchair assist level: Dependent -  Patient 0% Max wheelchair distance: 150    Wheelchair 50 feet with 2 turns activity    Assist        Assist Level: Dependent - Patient 0%   Wheelchair 150 feet activity     Assist      Assist Level: Dependent - Patient 0%   Blood pressure 133/66, pulse 63, temperature 98.2 F (36.8 C), resp. rate 17, height 5\' 8"  (1.727 m), weight 54.7 kg, SpO2 97 %.  Medical Problem List and Plan: 1.  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  Antithrombotics: -DVT/anticoagulation: Intravenous heparin transitioned back to Eliquis             -antiplatelet therapy: N/A 3. Pain Management: Neurontin 300 mg twice daily  8/28: Denies pain. Decrease Gabapentin to just HS dose.   8/29: Pain well controlled 4. Mood: Provide emotional support             -antipsychotic agents: N/A 5. Neuropsych: This patient is not capable of making decisions on her own behalf. 6. Skin/Wound Care: Routine skin checks 7. Fluids/Electrolytes/Nutrition: Routine in and outs.  CMP ordered. 8.  Atrial fibrillation.  Amiodarone 200 mg twice daily, Toprol-XL 25 mg daily. Will need outpatient f/u with cardiology. Reviewed their 8/27 note and says to maintain amiodarone.              Monitor with increased exertion. 9.  CAD/non-STEMI.  Follow-up cardiology services 10.  Type 2 diabetes mellitus.  Hemoglobin A1c 6.2.  Patient on Glucophage 500 mg twice daily prior to admission.  Resume as needed  8/28: will not restart metformin yet given AKI  8/29: poorly controlled. Changed diet to carb modified.              Monitor with increased mobility. 11.  Acute on chronic systolic congestive heart failure.  Lasix 40 mg daily.   Monitor for any signs of fluid overload             Daily weights. 12.  History of CEA as well as left carotid stenosis.  Follow-up vascular surgery 13.  Hyperlipidemia: Lipitor 14.  AKI.              Creatinine improving.    LOS: 2 days A FACE TO FACE EVALUATION WAS PERFORMED  Martha Clan P Sherilyn Windhorst 08/04/2020, 2:05 PM

## 2020-08-05 ENCOUNTER — Inpatient Hospital Stay (HOSPITAL_COMMUNITY): Payer: Medicare HMO | Admitting: Occupational Therapy

## 2020-08-05 ENCOUNTER — Inpatient Hospital Stay (HOSPITAL_COMMUNITY): Payer: Medicare HMO | Admitting: Speech Pathology

## 2020-08-05 ENCOUNTER — Inpatient Hospital Stay (HOSPITAL_COMMUNITY): Payer: Medicare HMO

## 2020-08-05 LAB — COMPREHENSIVE METABOLIC PANEL
ALT: 120 U/L — ABNORMAL HIGH (ref 0–44)
AST: 129 U/L — ABNORMAL HIGH (ref 15–41)
Albumin: 2.4 g/dL — ABNORMAL LOW (ref 3.5–5.0)
Alkaline Phosphatase: 211 U/L — ABNORMAL HIGH (ref 38–126)
Anion gap: 11 (ref 5–15)
BUN: 21 mg/dL (ref 8–23)
CO2: 22 mmol/L (ref 22–32)
Calcium: 8.5 mg/dL — ABNORMAL LOW (ref 8.9–10.3)
Chloride: 107 mmol/L (ref 98–111)
Creatinine, Ser: 1.19 mg/dL — ABNORMAL HIGH (ref 0.44–1.00)
GFR calc Af Amer: 48 mL/min — ABNORMAL LOW (ref >60)
GFR calc non Af Amer: 42 mL/min — ABNORMAL LOW (ref >60)
Glucose, Bld: 112 mg/dL — ABNORMAL HIGH (ref 70–99)
Potassium: 4.5 mmol/L (ref 3.5–5.1)
Sodium: 140 mmol/L (ref 135–145)
Total Bilirubin: 0.7 mg/dL (ref 0.3–1.2)
Total Protein: 4.8 g/dL — ABNORMAL LOW (ref 6.5–8.1)

## 2020-08-05 LAB — CBC WITH DIFFERENTIAL/PLATELET
Abs Immature Granulocytes: 0.06 10*3/uL (ref 0.00–0.07)
Basophils Absolute: 0 10*3/uL (ref 0.0–0.1)
Basophils Relative: 0 %
Eosinophils Absolute: 0.2 10*3/uL (ref 0.0–0.5)
Eosinophils Relative: 3 %
HCT: 32.7 % — ABNORMAL LOW (ref 36.0–46.0)
Hemoglobin: 10.6 g/dL — ABNORMAL LOW (ref 12.0–15.0)
Immature Granulocytes: 1 %
Lymphocytes Relative: 7 %
Lymphs Abs: 0.6 10*3/uL — ABNORMAL LOW (ref 0.7–4.0)
MCH: 28.3 pg (ref 26.0–34.0)
MCHC: 32.4 g/dL (ref 30.0–36.0)
MCV: 87.4 fL (ref 80.0–100.0)
Monocytes Absolute: 1 10*3/uL (ref 0.1–1.0)
Monocytes Relative: 12 %
Neutro Abs: 6.8 10*3/uL (ref 1.7–7.7)
Neutrophils Relative %: 77 %
Platelets: 181 10*3/uL (ref 150–400)
RBC: 3.74 MIL/uL — ABNORMAL LOW (ref 3.87–5.11)
RDW: 17.3 % — ABNORMAL HIGH (ref 11.5–15.5)
WBC: 8.8 10*3/uL (ref 4.0–10.5)
nRBC: 0.2 % (ref 0.0–0.2)

## 2020-08-05 LAB — GLUCOSE, CAPILLARY
Glucose-Capillary: 106 mg/dL — ABNORMAL HIGH (ref 70–99)
Glucose-Capillary: 146 mg/dL — ABNORMAL HIGH (ref 70–99)
Glucose-Capillary: 120 mg/dL — ABNORMAL HIGH (ref 70–99)

## 2020-08-05 MED ORDER — LIVING BETTER WITH HEART FAILURE BOOK: Freq: Once | Status: AC

## 2020-08-05 MED ORDER — LIVING WELL WITH DIABETES BOOK
Freq: Once | Status: AC
  Filled 2020-08-05: qty 1

## 2020-08-05 MED ORDER — MUSCLE RUB 10-15 % EX CREA
TOPICAL_CREAM | Freq: Two times a day (BID) | CUTANEOUS | Status: DC | PRN
  Filled 2020-08-05: qty 85

## 2020-08-05 MED ORDER — BLOOD PRESSURE CONTROL BOOK
Freq: Once | Status: AC
  Filled 2020-08-05: qty 1

## 2020-08-05 MED ORDER — COLLAGENASE 250 UNIT/GM EX OINT
TOPICAL_OINTMENT | Freq: Every day | CUTANEOUS | Status: DC
  Filled 2020-08-05 (×3): qty 30

## 2020-08-05 NOTE — Progress Notes (Signed)
Occupational Therapy Session Note  Patient Details  Name: Shawna Curry MRN: [id number] Date of Birth: [birth year]  Today's Date: 08/05/2020 OT Individual Time: 1400-1440 OT Individual Time Calculation (min): 40 min    Short Term Goals: Week 1:  OT Short Term Goal 1 (Week 1): Pt will demonstrate improve R hand coordination to be able to bring a spoon to her mouth with 50% accuracy. OT Short Term Goal 2 (Week 1): Pt will demonstrate improved coordination and praxis to wash UB with min A. OT Short Term Goal 3 (Week 1): Pt will demonstrate improved coordination and praxis to don a shirt with max A. OT Short Term Goal 4 (Week 1): pt will be able to sit at EOB with close S for 5 minutes to prepare for a transfer. OT Short Term Goal 5 (Week 1): Pt will be able to rise to stand in a stedy lift with max A of 1 to demonstrate improved attention.  Skilled Therapeutic Interventions/Progress Updates:    Pt sitting up in w/c, pleasantly confused, dtr at side throughout session.  Pt reporting pain in right foot, dtr reports nursing is aware and has attended to recently.  Pt unable to remember events during prior therapies therefore OT reviewed memory book with pt.  Instructed pt through body mechanics and sequencing during sit<>stand transfers from w/c using Stedy.  Pt requiring total assist + 2 first trial with failure to bring hips forward to achieve full stance.  Educated pt on weight shifting forward using visual and verbal cues.  Pt able to achieve full upright stance when placed in front of mirror for visual feedback after third trial of sit<>stand.  Pt completed unilateral release right and left as well with CGA at stedy.  Pt returned to w/c, call bell in reach, seat belt alarm on.  Therapy Documentation Precautions:  Precautions Precautions: Fall Precaution Comments: truncal and bil UE ataxia, severely impaired motor planning Restrictions Weight Bearing Restrictions: No   Therapy/Group:  Individual Therapy  Ezekiel Slocumb 08/05/2020, 5:36 PM

## 2020-08-05 NOTE — Progress Notes (Signed)
Inpatient Rehabilitation Center Individual Statement of Services  Patient Name:  Taegan Haider  Date:  08/05/2020  Welcome to the [facility].  Our goal is to provide you with an individualized program based on your diagnosis and situation, designed to meet your specific needs.  With this comprehensive rehabilitation program, you will be expected to participate in at least 3 hours of rehabilitation therapies Monday-Friday, with modified therapy programming on the weekends.  Your rehabilitation program will include the following services:  Physical Therapy (PT), Occupational Therapy (OT), Speech Therapy (ST), 24 hour per day rehabilitation nursing, Neuropsychology, Care Coordinator, Rehabilitation Medicine, Nutrition Services and Pharmacy Services  Weekly team conferences will be held on Wednesday to discuss your progress.  Your Inpatient Rehabilitation Care Coordinator will talk with you frequently to get your input and to update you on team discussions.  Team conferences with you and your family in attendance may also be held.  Expected length of stay: 21-23 days  Overall anticipated outcome: min-mod assist level  Depending on your progress and recovery, your program may change. Your Inpatient Rehabilitation Care Coordinator will coordinate services and will keep you informed of any changes. Your Inpatient Rehabilitation Care Coordinator's name and contact numbers are listed  below.  The following services may also be recommended but are not provided by the [facility]:    [place] will be made to provide these services after discharge if needed.  Arrangements include referral to agencies that provide these services.  Your insurance has been verified to be:  [employer] Your primary doctor is:  Iva Lento  Pertinent information will be shared with your doctor  and your insurance company.  Inpatient Rehabilitation Care Coordinator:  Ovidio Kin, [place] or Emilia Beck  Information discussed with and copy given to patient by: Elease Hashimoto, 08/05/2020, 10:40 AM

## 2020-08-05 NOTE — Progress Notes (Signed)
Occupational Therapy Session Note  Patient Details  Name: Shawna Curry MRN: [id number] Date of Birth: January 04, 1933  Today's Date: 08/05/2020 OT Individual Time: 1000-1045 OT Individual Time Calculation (min): 45 min    Short Term Goals: Week 1:  OT Short Term Goal 1 (Week 1): Pt will demonstrate improve R hand coordination to be able to bring a spoon to her mouth with 50% accuracy. OT Short Term Goal 2 (Week 1): Pt will demonstrate improved coordination and praxis to wash UB with min A. OT Short Term Goal 3 (Week 1): Pt will demonstrate improved coordination and praxis to don a shirt with max A. OT Short Term Goal 4 (Week 1): pt will be able to sit at EOB with close S for 5 minutes to prepare for a transfer. OT Short Term Goal 5 (Week 1): Pt will be able to rise to stand in a stedy lift with max A of 1 to demonstrate improved attention.  Skilled Therapeutic Interventions/Progress Updates:    Pt received in wc in room ready for therapy.  Pt taken to gym to work on sit to stands at hi low table with +2 for safety. Pt able to rise to stand with min A and then needed mod A to facilitate a full upright stand.  Pt was able to hold stand for close to 2 minutes, pt tends to rotate R leg into internal rotation.   Pt frequently talks about her sore toe but was in open sandals that did not put pressure on her toe.    Pt then worked on B coordination fastening level 1 and 2 clothespins onto bar with fairly good accuracy. Pt's demonstrates improved eye hand coordination and perception.    Pt then worked on sit to stand at parallel bars with 1 person only, pt saw herself in the mirror and said "oh there is my mom".  Reoriented pt.  Pt did know she was in a hospital and had a stroke.  Pt stood at bars for 2 min and able to bring hips forward with cues.    Excellent participation and progress. Pt returned to room and she requested to stay in the w/c. Belt alarm on and all needs met.   Therapy  Documentation Precautions:  Precautions Precautions: Fall Precaution Comments: truncal and bil UE ataxia, severely impaired motor planning Restrictions Weight Bearing Restrictions: No  Pain: Pain Assessment Pain Scale: Faces Faces Pain Scale: Hurts little more Pain Type: Acute pain Pain Location: Toe (Comment which one) Pain Orientation: Right Pain Descriptors / Indicators: Aching ADL: ADL Eating: Moderate assistance Grooming: Moderate assistance Upper Body Bathing: Moderate assistance Lower Body Bathing: Dependent Upper Body Dressing: Maximal assistance Lower Body Dressing: Dependent Toileting: Not assessed Toilet Transfer: Not assessed   Therapy/Group: Individual Therapy  [place] 08/05/2020, 11:28 AM

## 2020-08-05 NOTE — Progress Notes (Signed)
Patient Details  Name: Shawna Curry MRN: [id number] Date of Birth: 27-Dec-1932  Today's Date: 08/05/2020  Hospital Problems: Principal Problem:   Right sided cerebral infarction [facility])  Past Medical History:  Past Medical History:  Diagnosis Date  . Atrial fibrillation ([place])   . Coronary artery disease   . Hypertension    Past Surgical History:  Past Surgical History:  Procedure Laterality Date  . ABDOMINAL HYSTERECTOMY    . HAND TENDON SURGERY    . JOINT REPLACEMENT     R hip x 2  . PERCUTANEOUS PLACEMENT INTRAVASCULAR STENT CERVICAL CAROTID ARTERY    . TONSILLECTOMY     Social History:  reports that she has quit smoking. She has never used smokeless tobacco. She reports that she does not drink alcohol and does not use drugs.  Family / Support Systems Marital Status: Widow/Widower Patient Roles: Parent, Other (Comment) (church member) Children: Regina-daughter [phone] Duane-son [phone] Other Supports: grandchildren and nieces and nephews Anticipated Caregiver: children Ability/Limitations of Caregiver: Will need to come up with a 24/7 plan Caregiver Availability: 24/7 Family Dynamics: Close knit family who will pull together to provide the assist she needs. Have provided care after last CVA in March  Social History Preferred language: English Religion:  Cultural Background: No issues Education: HS Read: Yes Write: Yes Employment Status: Retired Public relations account executive Issues: No issues Guardian/Conservator: None-according to MD pt is not capable to make her own decisions will look to her children to make any decisions while here   Abuse/Neglect Abuse/Neglect Assessment Can Be Completed: Yes Physical Abuse: Denies Verbal Abuse: Denies Sexual Abuse: Denies Exploitation of patient/patient's resources: Denies Self-Neglect: Denies  Emotional Status Pt's affect, behavior and adjustment status: Pt motivated to do well and recover from this  stroke. She recovered and was independent and could resume cooking which she loves. She will do her best while here. Recent Psychosocial Issues: other health issues has had MI'S and two CVA'S this year. Psychiatric History: No issues/history Substance Abuse History: No issues  Patient / Family Perceptions, Expectations & Goals Pt/Family understanding of illness & functional limitations: Pt and daughter are able to explain her stroke and deficits They have spoken to the MD'S and feel they have a good understanding of her treatment plan going forward. Premorbid pt/family roles/activities: Mom, grandmother, aunt, church member, neighbor Anticipated changes in roles/activities/participation: resume Pt/family expectations/goals: Pt states: " I want to get back to what I was doing before this." Daughter states: " We want her to got back to what she was doing she loves to cook and was doing this."  [employer]: Other (Comment) (has been to SNF and had [place] in the recent past) Premorbid Home Care/DME Agencies: Other (Comment) (well care active with pt) Transportation available at discharge: Daughter and son Resource referrals recommended: Neuropsychology  Discharge Planning Living Arrangements: Alone Support Systems: Children, Other relatives, Friends/neighbors, Social worker community Type of Residence: Private residence Insurance Resources: Multimedia programmer (specify) ([place] Medicare) Financial Resources: [place] Referred: No Living Expenses: Own Money Management: Family Does the patient have any problems obtaining your medications?: No Home Management: Pt and children do the home management Patient/Family Preliminary Plans: Return home with children alternating checking in on her. At times she was alone and was safe prior to admission to be alone. She was using rw and was mod/i level. will work with daughter and son on safe discharge  plan. Care Coordinator Anticipated Follow Up Needs: HH/OP, Support Group  Clinical Impression  Pleasant female who has been through a lot in recent months with her health issues. She had recovered and was able to be home alone with children rotating in and out. Daughter and son switch off being here with Mom. Will work with all on safe discharge plan for pt. Daughter is till looking for her living will at home and will bring in once found.  Elease Hashimoto 08/05/2020, 10:36 AM

## 2020-08-05 NOTE — Progress Notes (Signed)
Speech Language Pathology Daily Session Note  Patient Details  Name: Shawna Curry MRN: [id number] Date of Birth: 10-Jan-1933  Today's Date: 08/05/2020 SLP Individual Time: 1255-1350 SLP Individual Time Calculation (min): 55 min  Short Term Goals: Week 1: SLP Short Term Goal 1 (Week 1): Pt will demonstrate orientation to place, time and situtation with mod A verbal cues. SLP Short Term Goal 2 (Week 1): Pt will demonstrate daily and novel recall with external aids with mod A verbal cues. SLP Short Term Goal 3 (Week 1): Pt will demonstrate functional and basic problem solving with mod A verbal cues. SLP Short Term Goal 4 (Week 1): Pt will demonstrate 2 safety precautions due to acute CVA deficits with mod A verbal cues. SLP Short Term Goal 5 (Week 1): Pt will demonstrate sustained attention in 15 minute interval with min A verbal cues for redirection to functional task.  Skilled Therapeutic Interventions: Pt was seen for skilled ST targeting cognitive goals. SLP facilitated session with introduction of memory notebook as compensatory strategy/aid for recall throughout her hospitalization. Total A required for recall of course of hospitalization and medical/therapy team member names as well as recall of events of AM therapies. Pt was independently oriented to "rehabilitation center", oriented to month with Min A for use of calendar, however required increased Mod A cues to orient to specific date and Total A to orient to situation. She required Moderate verbal and visual cues for functional problem solving and error awareness during a basic 3-step action card sequencing task. Pt returned to her room via wheelchair, session reviewed with daughter at bedside. She was left sitting in chair with alarm set and needs within reach, daughter still present. Continue per current plan of care.        Pain Pain Assessment Pain Scale: Faces Faces Pain Scale: Hurts a little bit Pain Type: Acute pain Pain  Location: Toe (Comment which one) Pain Orientation: Right Pain Descriptors / Indicators: Aching Pain Onset: On-going Pain Intervention(s): Emotional support;Distraction Multiple Pain Sites: No  Therapy/Group: Individual Therapy  Arbutus Leas 08/05/2020, 1:53 PM

## 2020-08-05 NOTE — Progress Notes (Signed)
[facility] PHYSICAL MEDICINE & REHABILITATION PROGRESS NOTE   Subjective/Complaints:  Soreness Left side chest , noticed it after a transfer, no radiating pain down arm or up neck , no SOB or diaphoresis  ROS: denies constipation, insomnia, pain   Objective:   No results found. Recent Labs    08/05/20 0551  WBC 8.8  HGB 10.6*  HCT 32.7*  PLT 181   Recent Labs    08/05/20 0551  NA 140  K 4.5  CL 107  CO2 22  GLUCOSE 112*  BUN 21  CREATININE 1.19*  CALCIUM 8.5*    Intake/Output Summary (Last 24 hours) at 08/05/2020 0902 Last data filed at 08/04/2020 1825 Gross per 24 hour  Intake 620 ml  Output --  Net 620 ml     Physical Exam: Vital Signs Blood pressure (!) 153/93, pulse 65, temperature 98 F (36.7 C), resp. rate 19, height 5\' 8"  (1.727 m), weight 54.7 kg, SpO2 99 %.  General: No acute distress Mood and affect are appropriate Heart: Regular rate and rhythm no rubs murmurs or extra sounds Lungs: Clear to auscultation, breathing unlabored, no rales or wheezes Abdomen: Positive bowel sounds, soft nontender to palpation, nondistended Extremities: No clubbing, cyanosis, or edema Skin: No evidence of breakdown, no evidence of rash MSK tenderness LLSB no bruising   She does have some difficulty naming objects with decrease in recall.   Follows simple commands. Motor: RUE: 3/5 proximal distally LUE: 4+/5 proximal distal Bilateral lower extremities: 4+/5 distal  Psychiatric:        Speech: Speech is tangential.        Cognition and Memory: Cognition is impaired. Memory is impaired.       Assessment/Plan: 1. Functional deficits secondary to right sided cerebral infarction which require 3+ hours per day of interdisciplinary therapy in a comprehensive inpatient rehab setting.  Physiatrist is providing close team supervision and 24 hour management of active medical problems listed below.  Physiatrist and rehab team continue to assess barriers to  discharge/monitor patient progress toward functional and medical goals  Care Tool:  Bathing    Body parts bathed by patient: Abdomen, Chest, Face   Body parts bathed by helper: Right arm, Left arm, Front perineal area, Buttocks, Right upper leg, Left upper leg, Right lower leg, Left lower leg     Bathing assist Assist Level: Maximal Assistance - Patient 24 - 49%     Upper Body Dressing/Undressing Upper body dressing   What is the patient wearing?: Hospital gown only    Upper body assist Assist Level: Total Assistance - Patient < 25%    Lower Body Dressing/Undressing Lower body dressing      What is the patient wearing?: Incontinence brief     Lower body assist Assist for lower body dressing: Total Assistance - Patient < 25%     Toileting Toileting    Toileting assist Assist for toileting: Maximal Assistance - Patient 25 - 49%     Transfers Chair/bed transfer  Transfers assist     Chair/bed transfer assist level: Dependent - Patient 0%     Locomotion Ambulation   Ambulation assist   Ambulation activity did not occur: Safety/medical concerns          Walk 10 feet activity   Assist  Walk 10 feet activity did not occur: Safety/medical concerns        Walk 50 feet activity   Assist Walk 50 feet with 2 turns activity did not occur: Safety/medical concerns  Walk 150 feet activity   Assist Walk 150 feet activity did not occur: Safety/medical concerns         Walk 10 feet on uneven surface  activity   Assist Walk 10 feet on uneven surfaces activity did not occur: Safety/medical concerns         Wheelchair     Assist        Wheelchair assist level: Dependent - Patient 0% Max wheelchair distance: 150    Wheelchair 50 feet with 2 turns activity    Assist        Assist Level: Dependent - Patient 0%   Wheelchair 150 feet activity     Assist      Assist Level: Dependent - Patient 0%   Blood  pressure (!) 153/93, pulse 65, temperature 98 F (36.7 C), resp. rate 19, height 5\' 8"  (1.727 m), weight 54.7 kg, SpO2 99 %.  Medical Problem List and Plan: 1.  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  Antithrombotics: -DVT/anticoagulation: Intravenous heparin transitioned back to Eliquis             -antiplatelet therapy: N/A 3. Pain Management: Neurontin 300 mg twice daily  8/28: Denies pain. Decrease Gabapentin to just HS dose.   MSK CP sportscreme 4. Mood: Provide emotional support             -antipsychotic agents: N/A 5. Neuropsych: This patient is not capable of making decisions on her own behalf. 6. Skin/Wound Care: Routine skin checks 7. Fluids/Electrolytes/Nutrition: Routine in and outs.  CMP ordered. 8.  Atrial fibrillation.  Amiodarone 200 mg twice daily, Toprol-XL 25 mg daily. Will need outpatient f/u with cardiology. Reviewed their 8/27 note and says to maintain amiodarone.              Monitor with increased exertion. 9.  CAD/non-STEMI.  Follow-up cardiology services 10.  Type 2 diabetes mellitus.  Hemoglobin A1c 6.2.  Patient on Glucophage 500 mg twice daily prior to admission.  Resume as needed  8/28: will not restart metformin yet given AKI  8/29: poorly controlled. Changed diet to carb modified.              Monitor with increased mobility. 11.  Acute on chronic systolic congestive heart failure.  Lasix 40 mg daily.  Monitor for any signs of fluid overload             Daily weights. 12.  History of CEA as well as left carotid stenosis.  Follow-up vascular surgery 13.  Hyperlipidemia: Lipitor 14.  AKI.              Creatinine improving.    LOS: 3 days A FACE TO Merrick E Kelleigh Skerritt 08/05/2020, 9:02 AM

## 2020-08-05 NOTE — Progress Notes (Signed)
Patient ID: Shawna Curry, female   DOB: Sep 15, 1933, 86 y.o.   MRN: [id number] Met with the patient and her daughter Rollene Fare to review the role of the nurse CM and nursing educational needs. Reviewed care for wounds on right great and second toe; patient had been seen by a podiatrist for foot care and using santyl dressings to the toes daily PTA. Daughter also expressed a need for more information about mychart access and was given the link with the access code for her mother's record. Also reviewed secondary stroke risk factors including Afib and HF, HTN. Discussed HLD (114), T2DM (A1C 6.2) were WNL and given handouts on HF along with a HH diet. Patient reportedly used Ms. DASH PTA for seasoning her foods when she cooked. Daughter is very supportive of patient returning to MOD I level. No other questions or concerns noted at present. Continue to follow along and address questions/concerns/educational needs. Margarito Liner

## 2020-08-05 NOTE — Progress Notes (Signed)
Physical Therapy Session Note  Patient Details  Name: Shawna Curry MRN: [id number] Date of Birth: 10/17/1933  Today's Date: 08/05/2020 PT Individual Time: 0800-0845 PT Individual Time Calculation (min): 45 min   Short Term Goals: Week 1:  PT Short Term Goal 1 (Week 1): pt will sit EOB with min assist up to 5 minutes PT Short Term Goal 2 (Week 1): Pt will trasnfer to and from [facility] with max assist of 1 PT Short Term Goal 3 (Week 1): Pt will perform bed mobility with max assist PT Short Term Goal 4 (Week 1): Pt will initated self propelled WC mobility  Skilled Therapeutic Interventions/Progress Updates:    Pt received supine in bed, awake and alert, agreeable to PT session. Pt denies pain. Donned lower body sweatpants while pt supine, requiring mod/maxA via threading, cues for bridging bottom to assist with pulling up. Supine>sit with modA with HOB nearly flat, used HR's for support. Able to maintain short sitting EOB with CGA, posterior lean noted. She was able to don her shirt while seated EOB with minA for posterior lean and pulling shirt over her head. Squat>pivot from EOB to w/c with mod/maxA, cues for head/hip and anterior weight shift, limited carryover. Once she was positioned in the w/c, she reports need for toileting. Wheeled to her bathroom in the w/c, squat<>pivot with maxA from w/c to raised BSC over toilet. Required modA for repositioning on BSC and for lateral weight shift to remove her pants/brief. Distant supervision provided for toileting where she was successful in both voiding and BM. Able to perform pericare with setupA. Performed squat<>pivot back to her w/c with mod/maxA and then placed pt facing grab bars in bathroom where she was able to perform sit<>stand with modA with BUE's on grab bar, totalA for donning pants/briefs. Pt then taken to sink where she washed her hands while seated in w/c. Due to time constraints, unable to transport her to therapy gym, remained seated in w/c at  end of session with lap belt on, needs in reach.   Therapy Documentation Precautions:  Precautions Precautions: Fall Precaution Comments: truncal and bil UE ataxia, severely impaired motor planning Restrictions Weight Bearing Restrictions: No  Therapy/Group: Individual Therapy  Sloane Palmer P Shakea Isip PT 08/05/2020, 10:24 AM

## 2020-08-05 NOTE — IPOC Note (Signed)
Overall Plan of Care [facility]) Patient Details Name: Shawna Curry MRN: [id number] DOB: November 29, 1933  Admitting Diagnosis: Right sided cerebral infarction [facility])  Hospital Problems: Principal Problem:   Right sided cerebral infarction [facility])     Functional Problem List: Nursing Bladder, Endurance, Medication Management, Skin Integrity, Sensory  PT Balance, Behavior, Endurance, Motor, Pain, Nutrition, Perception, Safety, Sensory, Skin Integrity  OT Balance, Cognition, Endurance, Motor, Perception, Safety, Sensory  SLP    TR         Basic ADLs: OT Eating, Grooming, Bathing, Dressing, Toileting     Advanced  ADLs: OT       Transfers: PT Bed Mobility, Bed to Chair, Car, [employer], Floor  OT Toilet     Locomotion: PT Ambulation, Emergency planning/management officer, Stairs     Additional Impairments: OT Fuctional Use of Upper Extremity  SLP Social Cognition   Awareness, Attention, Memory, Problem Solving  TR      Anticipated Outcomes Item Anticipated Outcome  Self Feeding supervision  Swallowing      Basic self-care  mod A  Toileting  mod A   Bathroom Transfers mod A to toilet  Bowel/Bladder  cont x 2, min assist  Transfers  Mod Assist with LRAD to and from [place] assist WC mobility for short distances.  Communication     Cognition  Min A  Pain  less than 3  Safety/Judgment  supervision assist   Therapy Plan: PT Intensity: Minimum of 1-2 x/day ,45 to 90 minutes PT Frequency: 5 out of 7 days PT Duration Estimated Length of Stay: 3-4 weeks OT Intensity: Minimum of 1-2 x/day, 45 to 90 minutes OT Frequency: 5 out of 7 days OT Duration/Estimated Length of Stay: 21-23 days SLP Intensity: Minumum of 1-2 x/day, 30 to 90 minutes SLP Frequency: 3 to 5 out of 7 days SLP Duration/Estimated Length of Stay: 12-16 days   Due to the current state of emergency, patients may not be receiving their 3-hours of Medicare-mandated therapy.   Team Interventions: Nursing  Interventions Patient/Family Education, Bladder Management, Disease Management/Prevention, Medication Management, Skin Care/Wound Management, Cognitive Remediation/Compensation, Discharge Planning  PT interventions Ambulation/gait training, DME/adaptive equipment instruction, Neuromuscular re-education, Community reintegration, Psychosocial support, Wheelchair propulsion/positioning, UE/LE Strength taining/ROM, IT trainer, Training and development officer, Discharge planning, Functional electrical stimulation, Pain management, Skin care/wound management, Therapeutic Activities, UE/LE Coordination activities, Cognitive remediation/compensation, Disease management/prevention, Functional mobility training, Patient/family education, Splinting/orthotics, Therapeutic Exercise, Visual/perceptual remediation/compensation  OT Interventions Balance/vestibular training, Cognitive remediation/compensation, Discharge planning, Functional mobility training, DME/adaptive equipment instruction, Disease mangement/prevention, Neuromuscular re-education, Patient/family education, Psychosocial support, Therapeutic Activities, Self Care/advanced ADL retraining, Therapeutic Exercise, UE/LE Strength taining/ROM, UE/LE Coordination activities, Visual/perceptual remediation/compensation  SLP Interventions Cognitive remediation/compensation, Cueing hierarchy, Functional tasks, Patient/family education, Internal/external aids  TR Interventions    SW/CM Interventions Discharge Planning, Psychosocial Support, Patient/Family Education   Barriers to Discharge MD  Medical stability  Nursing      PT Inaccessible home environment, Decreased caregiver support, Home environment access/layout, Lack of/limited family support, Insurance for SNF coverage, Behavior    OT Other (comments) multiple deficits with cognition, sensation, praxis that may inhibit physical progression  SLP      SW       Team Discharge Planning: Destination:  PT-Home ,OT- Home , SLP-Home Projected Follow-up: PT-Skilled nursing facility, OT-  Skilled nursing facility (SNF if pt is not able to progress past a total A of 2 level with her mobility), SLP-24 hour supervision/assistance, Home Health SLP Projected Equipment Needs: PT-To be determined, Wheelchair cushion (  measurements), Wheelchair (measurements), Sliding board, OT- To be determined, SLP-None recommended by SLP Equipment Details: PT- , OT-  Patient/family involved in discharge planning: PT- Patient,  OT-Patient, Family member/caregiver, SLP-Patient  MD ELOS: 12-16d Medical Rehab Prognosis:  Fair Assessment:  86 year old right-handed female with history of atrial fibrillation maintained on Eliquis as well as Plavix, hyperlipidemia, DVT of tibial vein, type 2 diabetes mellitus, PCA infarction with residual right-sided weakness, CAD/non-STEMI 02/2020 followed by Dr.Rohrbeck with [facility], history of right CEA, left carotid stenosis  followed by vascular surgery, acute on chronic systolic congestive heart failure.  History taken from chart review due to cognition.  Patient lives alone 1 level home with ramped entrance.  Ambulates with a rolling walker.  She has good family support with family alternating schedules to provide assistance.  Earlier this year patient went to the hospital on multiple occasions short stay in skilled nursing facility was receiving first outpatient therapy and then home health therapy.  She presented on 07/25/2020 with SOB.  Noted to be hypotensive became unresponsive in the ED requiring intubation/PEA arrest 5 minutes of CPR given epinephrine, bicarb also with given.  EKG showed interval T waves in V5 V6 with 1 mm of elevation in V4 and placed on intravenous amiodarone as well as IV heparin.  EEG negative for seizures, nonspecific generalized cerebral dysfunction encephalopathy no seizure activity.  Carotid Dopplers with 40 to 59% left ICA stenosis.  Admission  chemistries potassium 5.2, BUN 51 creatinine 1.90 troponin 26-29, lactic acid 2.8, BNP [id number].  Initial echocardiogram with ejection fraction of 40-45%.  The left ventricle demonstrating global hypokinesis and echocardiogram was repeated 07/31/2020 showing ejection fraction 35 to 40% mild concentric left ventricular hypertrophy moderately elevated pulmonary artery systolic pressure.Marland Kitchen  She was extubated 07/29/2020 aspirin initiated 07/30/2020.  Close monitor renal function elevated 2.31 and improved with gentle hydration latest creatinine 1.75.  MRI of the brain completed 07/30/2020 after extubation showed a small acute posterior right cerebral infarction advised to  Resume home Eliquis per Neurology and initial aspirin was then discontinued.  Patient is on mechanical soft diet.  Therapy evaluations completed and patient was admitted for a comprehensive rehab program.     Now requiring 24/7 Rehab RN,MD, as well as CIR level PT, OT and SLP.  Treatment team will focus on ADLs and mobility with goals set at min/modA See Team Conference Notes for weekly updates to the plan of care

## 2020-08-06 ENCOUNTER — Inpatient Hospital Stay (HOSPITAL_COMMUNITY): Payer: Medicare HMO

## 2020-08-06 ENCOUNTER — Inpatient Hospital Stay (HOSPITAL_COMMUNITY): Payer: Medicare HMO | Admitting: Occupational Therapy

## 2020-08-06 LAB — GLUCOSE, CAPILLARY
Glucose-Capillary: 101 mg/dL — ABNORMAL HIGH (ref 70–99)
Glucose-Capillary: 110 mg/dL — ABNORMAL HIGH (ref 70–99)
Glucose-Capillary: 155 mg/dL — ABNORMAL HIGH (ref 70–99)

## 2020-08-06 MED ORDER — GABAPENTIN 600 MG PO TABS
300.0000 mg | ORAL_TABLET | Freq: Two times a day (BID) | ORAL | Status: DC
  Administered 2020-08-06 – 2020-08-21 (×30): 300 mg via ORAL
  Filled 2020-08-06 (×31): qty 1

## 2020-08-06 NOTE — Progress Notes (Signed)
Physical Therapy Session Note  Patient Details  Name: Shawna Curry MRN: [id number] Date of Birth: [birth year]  Today's Date: 08/06/2020 PT Individual Time: 1100-1200 + 1400-1445 PT Individual Time Calculation (min): 60 min + 45 min  Short Term Goals: Week 1:  PT Short Term Goal 1 (Week 1): pt will sit EOB with min assist up to 5 minutes PT Short Term Goal 2 (Week 1): Pt will trasnfer to and from [facility] with max assist of 1 PT Short Term Goal 3 (Week 1): Pt will perform bed mobility with max assist PT Short Term Goal 4 (Week 1): Pt will initated self propelled WC mobility  Skilled Therapeutic Interventions/Progress Updates:     AM: Pt received supine in bed, awake and agreeable to PT session. Daughter at bedside. Pt donned her pants with mod/maxA while supine, able to bridge bottom to assist pulling up her pants. B shoes donned with totalA for time management. Performed supine<>sit with mod/maxA with HOB flat via log rolling technique. Squat<>pivot with mod/maxA +2 from EOB to w/c. Pt then voicing needs for toileting so she was transported to her bathroom in w/c, performed squat<>pivot with mod/maxA +2 to raised BSC over toilet. Pt succsfull in voiding and having a BM, required mod/maxA for squat pivot back to her w/c and then placed facing HR in bathroom where she grabbed with BUE's to assist with fully standing to don new brief and pull up her pants. Pt then transported out of room to the // bars to perform gait training. Pt able to perform sit<>stands with modA in // bars, required therapist facilitation for anterior weight shift and upright/erect posture, using mirror for visual feedback. Pt able to ambulate length of // bars x2 bouts (seated rest break)  (~50ft) with minA + w/c follow for safety, therapist cueing for widening BOS and reducing posterior lean. Attempted to progress to RW trials however pt demo's significant posterior lean with RW and unable to achieve full upright standing with RW,  possibly impacted by fatigue from earlier events in session, will continue efforts to progress. Pt transported back to her room in w/c, remained seated with belt alarm on, daughter at bedside, needs in reach.  PM: Pt received sitting in w/c, daughter at bedside, pt expressing need to toilet urgently. NT called for + 2 assist. Pt wheeled to her bathroom and performed squat<>pivot with maxA +2 from w/c to [facility] over her toilet. Performed sit<>stand with maxA +1 from [facility] and NT assisted with lowering pants/brief. Pt continent of voiding/BM, setupA for pericare. Performed sit<>stand with maxA +1 from toilet and NT assisted with donning brief/pants, stand<>pivot with mod/maxA +2. Pt wheeled sinkside for hand hygiene. Pt then transported to therapy gym with [place] in w/c for time management, placed facing // bars. Pt performed x5 sit<>stands with modA +1 with BUE's on // bar, performed pre-gait training each trial including static standing marching, lateral weight shifts, standing toe taps on // bar platform, and focusing on upright/erect posture. Pt then performed lateral stepping L<>R along the length of the // bars 2x28ft with minA. Then pt performed peg board in standing while facing // bars, requiring minA while standing for hip extension and erect posture, pt alternating using RUE vs LUE for peg placement. Trial of sit<>stands without // bars however pt continues to demo such a significant posterior lean, requires +2 for safety. Pt transported back to her room, remained in her w/c at end of session, daughter at bedside, needs in reach.  Therapy Documentation  Precautions:  Precautions Precautions: Fall Precaution Comments: truncal and bil UE ataxia, severely impaired motor planning Restrictions Weight Bearing Restrictions: No Pain: Pain Assessment Pain Scale: Faces Pain Score: 6  Faces Pain Scale: No hurt  Therapy/Group: Individual Therapy  Murry Khiev P Odis Turck PT 08/06/2020, 12:43 PM

## 2020-08-06 NOTE — Progress Notes (Signed)
Occupational Therapy Session Note  Patient Details  Name: Shawna Curry MRN: [id number] Date of Birth: Jun 20, 1933  Today's Date: 08/06/2020 OT Individual Time: 1510-1540 OT Individual Time Calculation (min): 30 min    Short Term Goals: Week 1:  OT Short Term Goal 1 (Week 1): Pt will demonstrate improve R hand coordination to be able to bring a spoon to her mouth with 50% accuracy. OT Short Term Goal 2 (Week 1): Pt will demonstrate improved coordination and praxis to wash UB with min A. OT Short Term Goal 3 (Week 1): Pt will demonstrate improved coordination and praxis to don a shirt with max A. OT Short Term Goal 4 (Week 1): pt will be able to sit at EOB with close S for 5 minutes to prepare for a transfer. OT Short Term Goal 5 (Week 1): Pt will be able to rise to stand in a stedy lift with max A of 1 to demonstrate improved attention.  Skilled Therapeutic Interventions/Progress Updates:    Pt sitting up in w/c, c/o right toe soreness and chest soreness intermittently.  Pt reporting she washed up this morning, however dtr reports she doesn't believe pt has washed today.  Pt completed UB/LB bathing and dressing sitting/standing at sinkside.  Pt needed supervision to complete UB bathing for sequencing.  Pt needed mod assist for all UB dressing.  Pt bathed all body parts except periarea and buttocks with supervision in sitting.  Pt needing max assist for LB dressing and mod assist for sit<>stand while blocking right knee, then pt able to stand at sinkside with CGA while OT pulled pants up/down over hips.  Pt sitting in w/c, call bell in reach, belt alarm on.     Therapy Documentation Precautions:  Precautions Precautions: Fall Precaution Comments: truncal and bil UE ataxia, severely impaired motor planning Restrictions Weight Bearing Restrictions: No     Therapy/Group: Individual Therapy  Ezekiel Slocumb 08/06/2020, 12:55 PM

## 2020-08-06 NOTE — Progress Notes (Signed)
Shawna Curry PHYSICAL MEDICINE & REHABILITATION PROGRESS NOTE   Subjective/Complaints: She has no complaints. PT mentions she has been having right great toe pain. Discussed that we had decreased her Gabapentin dose given no pain- will increase back to BID.    ROS: denies constipation, insomnia, pain   Objective:   No results found. Recent Labs    08/05/20 0551  WBC 8.8  HGB 10.6*  HCT 32.7*  PLT 181   Recent Labs    08/05/20 0551  NA 140  K 4.5  CL 107  CO2 22  GLUCOSE 112*  BUN 21  CREATININE 1.19*  CALCIUM 8.5*    Intake/Output Summary (Last 24 hours) at 08/06/2020 1434 Last data filed at 08/06/2020 0510 Gross per 24 hour  Intake 402 ml  Output --  Net 402 ml     Physical Exam: Vital Signs Blood pressure (!) 144/77, pulse 64, temperature 98 F (36.7 C), resp. rate 18, height 5\' 8"  (1.727 m), weight 52.2 kg, SpO2 99 %.   General: Alert and oriented x 3, No apparent distress HEENT: Head is normocephalic, atraumatic, PERRLA, EOMI, sclera anicteric, oral mucosa pink and moist, dentition intact, ext ear canals clear,  Neck: Supple without JVD or lymphadenopathy Heart: Reg rate and rhythm. No murmurs rubs or gallops Chest: CTA bilaterally without wheezes, rales, or rhonchi; no distress Abdomen: Soft, non-tender, non-distended, bowel sounds positive. Extremities: No clubbing, cyanosis, or edema. Pulses are 2+ Skin: Clean and intact without signs of breakdown MSK tenderness LLSB no bruising   She does have some difficulty naming objects with decrease in recall.   Follows simple commands. Motor: RUE: 3/5 proximal distally LUE: 4+/5 proximal distal Bilateral lower extremities: 4+/5 distal  Psychiatric:        Speech: Speech is tangential.        Cognition and Memory: Cognition is impaired. Memory is impaired.         Assessment/Plan: 1. Functional deficits secondary to right sided cerebral infarction which require 3+ hours per day of interdisciplinary  therapy in a comprehensive inpatient rehab setting.  Physiatrist is providing close team supervision and 24 hour management of active medical problems listed below.  Physiatrist and rehab team continue to assess barriers to discharge/monitor patient progress toward functional and medical goals  Care Tool:  Bathing    Body parts bathed by patient: Abdomen, Chest, Face   Body parts bathed by helper: Right arm, Left arm, Front perineal area, Buttocks, Right upper leg, Left upper leg, Right lower leg, Left lower leg     Bathing assist Assist Level: Maximal Assistance - Patient 24 - 49%     Upper Body Dressing/Undressing Upper body dressing   What is the patient wearing?: Pull over shirt    Upper body assist Assist Level: Maximal Assistance - Patient 25 - 49%    Lower Body Dressing/Undressing Lower body dressing      What is the patient wearing?: Incontinence brief     Lower body assist Assist for lower body dressing: Total Assistance - Patient < 25%     Toileting Toileting    Toileting assist Assist for toileting: Maximal Assistance - Patient 25 - 49%     Transfers Chair/bed transfer  Transfers assist     Chair/bed transfer assist level: 2 Helpers     Locomotion Ambulation   Ambulation assist   Ambulation activity did not occur: Safety/medical concerns  Assist level: 2 helpers Assistive device: Parallel bars Max distance: 10'   Walk 10 feet  activity   Assist  Walk 10 feet activity did not occur: Safety/medical concerns  Assist level: 2 helpers Assistive device: Parallel bars   Walk 50 feet activity   Assist Walk 50 feet with 2 turns activity did not occur: Safety/medical concerns         Walk 150 feet activity   Assist Walk 150 feet activity did not occur: Safety/medical concerns         Walk 10 feet on uneven surface  activity   Assist Walk 10 feet on uneven surfaces activity did not occur: Safety/medical concerns          Wheelchair     Assist Will patient use wheelchair at discharge?: Yes (Per PT long-term goals ) Type of Wheelchair: Manual    Wheelchair assist level: Dependent - Patient 0% Max wheelchair distance: 150    Wheelchair 50 feet with 2 turns activity    Assist        Assist Level: Dependent - Patient 0%   Wheelchair 150 feet activity     Assist      Assist Level: Dependent - Patient 0%   Blood pressure (!) 144/77, pulse 64, temperature 98 F (36.7 C), resp. rate 18, height 5\' 8"  (1.727 m), weight 52.2 kg, SpO2 99 %.  Medical Problem List and Plan: 1.  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  Antithrombotics: -DVT/anticoagulation: Intravenous heparin transitioned back to Eliquis             -antiplatelet therapy: N/A 3. Pain Management: Neurontin 300 mg twice daily  8/28: Denies pain. Decrease Gabapentin to just HS dose.   MSK CP sportscreme  8/31: reports increased right great toe pain. Increase Gabapentin back to 300mg  BID 4. Mood: Provide emotional support             -antipsychotic agents: N/A 5. Neuropsych: This patient is not capable of making decisions on her own behalf. 6. Skin/Wound Care: Routine skin checks 7. Fluids/Electrolytes/Nutrition: Routine in and outs.  CMP ordered. 8.  Atrial fibrillation.  Amiodarone 200 mg twice daily, Toprol-XL 25 mg daily. Will need outpatient f/u with cardiology. Reviewed their 8/27 note and says to maintain amiodarone.              Monitor with increased exertion. 9.  CAD/non-STEMI.  Follow-up cardiology services 10.  Type 2 diabetes mellitus.  Hemoglobin A1c 6.2.  Patient on Glucophage 500 mg twice daily prior to  admission.  Resume as needed  8/28: will not restart metformin yet given AKI  8/29: poorly controlled. Changed diet to carb modified.   8/31: better controlled.              Monitor with increased mobility. 11.  Acute on chronic systolic congestive heart failure.  Lasix 40 mg daily.  Monitor for any signs of fluid overload             Daily weights. 12.  History of CEA as well as left carotid stenosis.  Follow-up vascular surgery 13.  Hyperlipidemia: Lipitor 14.  AKI.  Creatinine improving. 1.19 on 8/30    LOS: 4 days A FACE TO FACE EVALUATION WAS PERFORMED  Clide Deutscher Kailoni Vahle 08/06/2020, 2:34 PM

## 2020-08-06 NOTE — Progress Notes (Signed)
Speech Language Pathology Daily Session Note  Patient Details  Name: Shawna Curry MRN: [id number] Date of Birth: 1933/01/09  Today's Date: 08/06/2020 SLP Individual Time: 1791-5056 SLP Individual Time Calculation (min): 55 min  Short Term Goals: Week 1: SLP Short Term Goal 1 (Week 1): Pt will demonstrate orientation to place, time and situtation with mod A verbal cues. SLP Short Term Goal 2 (Week 1): Pt will demonstrate daily and novel recall with external aids with mod A verbal cues. SLP Short Term Goal 3 (Week 1): Pt will demonstrate functional and basic problem solving with mod A verbal cues. SLP Short Term Goal 4 (Week 1): Pt will demonstrate 2 safety precautions due to acute CVA deficits with mod A verbal cues. SLP Short Term Goal 5 (Week 1): Pt will demonstrate sustained attention in 15 minute interval with min A verbal cues for redirection to functional task.  Skilled Therapeutic Interventions: Pt was seen for skilled ST targeting cognitive goals. SLp facilitated session with Mod A verbal and visual cues for functional use of memory notebook to recall events from yesterday and orientation information related to situation. Pt required Mod A for orientation to time with calendar. Pt stated awareness that she should not get up without help and identified call bell as device to use to request assistance, but Mod A verbal and visual cues required for problem solving and recall for it's functional use. Call bell modified to include visual aid to assist with recall and functional use. During basic money management task with cash and coins, pt sorted money by type with Min A verbal and visual cues for problem solving and error awareness. Mod-Max A required for functional problem solving when calculating totals and change due (addition and subtraction). Her verbal problem solving was better than functional throughout task. Visual aids assisted with recall within task. She sustained attention  appropriately with Supervision A verbal cues for redirection. Pt left laying in bed with alarm set and needs within reach. Continue per current plan of care.          Pain Pain Assessment Pain Scale: Faces Faces Pain Scale: No hurt  Therapy/Group: Individual Therapy  Arbutus Leas 08/06/2020, 11:58 AM

## 2020-08-07 ENCOUNTER — Inpatient Hospital Stay (HOSPITAL_COMMUNITY): Payer: Medicare HMO | Admitting: Occupational Therapy

## 2020-08-07 ENCOUNTER — Inpatient Hospital Stay (HOSPITAL_COMMUNITY): Payer: Medicare HMO

## 2020-08-07 ENCOUNTER — Inpatient Hospital Stay (HOSPITAL_COMMUNITY): Payer: Medicare HMO | Admitting: Speech Pathology

## 2020-08-07 LAB — GLUCOSE, CAPILLARY
Glucose-Capillary: 81 mg/dL (ref 70–99)
Glucose-Capillary: 57 mg/dL — ABNORMAL LOW (ref 70–99)
Glucose-Capillary: 57 mg/dL — ABNORMAL LOW (ref 70–99)
Glucose-Capillary: 69 mg/dL — ABNORMAL LOW (ref 70–99)
Glucose-Capillary: 90 mg/dL (ref 70–99)
Glucose-Capillary: 79 mg/dL (ref 70–99)
Glucose-Capillary: 133 mg/dL — ABNORMAL HIGH (ref 70–99)

## 2020-08-07 NOTE — Progress Notes (Signed)
Physical Therapy Session Note  Patient Details  Name: Shawna Curry MRN: [id number] Date of Birth: 02/19/1933  Today's Date: 08/07/2020 PT Individual Time: 9622-2979 + 1130-1200 PT Individual Time Calculation (min): 75 min +30 min  Short Term Goals: Week 1:  PT Short Term Goal 1 (Week 1): pt will sit EOB with min assist up to 5 minutes PT Short Term Goal 2 (Week 1): Pt will trasnfer to and from [facility] with max assist of 1 PT Short Term Goal 3 (Week 1): Pt will perform bed mobility with max assist PT Short Term Goal 4 (Week 1): Pt will initated self propelled WC mobility  Skilled Therapeutic Interventions/Progress Updates:      1st session: Pt received supine in bed, agreeable to PT session. Pt reports intermittent R great toe and chest pain during session. Described chest pain as indigestion. B shoes donned with totalA while pt supine for time management. Supine<>sit with modA with HOB slightly elevated, assist for BLE and trunk management via log rolling technique. Able to maintain short sitting EOB with CGA due to posterior lean. Performed squat<>pivot from EOB to w/c with maxA +2. Pt reports toileting needs, so she was wheeled to her bathroom. Performed sit<>stand in bathroom with modA with BUE's on bathroom rail, stand<>pivot to raised BSC over toilet with maxA. Pt continent of voiding and BM, required maxA for pericare. Stand<>pivot with maxA +2 with BUE's on grab bar from [facility] to w/c. Wheeled sinkside where she performed hand hygiene while seated in chair. Pt transported to therapy gym with [place] in w/c for time management and energy conservation. Performed seated there-ex: -1x5 2# dowel rod chest press -1x5 2# dowel rod bicep curl -1x8 unweighted arm raises to 90deg -1x10 seated LAQ with 2.5# ankle weight -1x10 seated hip marches with 2.5# ankle weight  Pt then wheeled facing // bars, performed sit<>stand with BUE's on // bar with modA, cues for forward weight shift and upright posture.  Performed standing marching/toe taps on // bar platform with 2.5#ankle weights with CGA while maintaining BUE grip to // bar. Pt wheeled inside // bars, ambulated length of // bars x4 bouts forward and backward walking with mostly CGA with w/c follow for safety. Required modA for sit<>stand in // bars with BUE's on bars. Unable to succesfully stand with BUE's on armrests vs // bar. Performed 1x10 mini-squats in // bars focusing on hip extension and forward weight shift. Pt transported back to her room with totalA in w/c, remained seated in w/c at end of session with seat belt alarm on, needs in reach.   2nd session: Pt received sitting in w/c, agreeable to PT session. Pt unable to recall therapist's name nor therapists role. NT shortly arriving after therapist for blood glucose test, reading 57. NT provided a cup of cold sprite which patient drank the entire cup and resumed therapy session. NT to reassess BG once pt returns from therapy. Pt transported with totalA in w/c from room to therapy gym for time management. Placed in // bars and ambulated length of // bars (85ft) forward and backward with CGA x2 bouts. After ambulating, pt reports she had incontinent urine episode. Stand>sit with modA for controlled lowering, wheeled back to her room with [place]. Stand<>pivot transfer with maxA and no AD from w/c to EOB. Sit>supine with modA for trunk control and BLE management. NT arriving thereafter to assess BG, NT notified of pt's incontinent episode for cleaning. Pt ended session semi-reclined in bed, needs in reach, NT present, bed  alarm on.   Therapy Documentation Precautions:  Precautions Precautions: Fall Precaution Comments: truncal and bil UE ataxia, severely impaired motor planning Restrictions Weight Bearing Restrictions: No Vital Signs: Therapy Vitals Temp: 98.6 F (37 C) Temp Source: Oral Pulse Rate: 62 Resp: 18 BP: 129/85 Patient Position (if appropriate): Lying Oxygen Therapy SpO2: 98  % O2 Device: Room Air   Therapy/Group: Individual Therapy  Blaze Nylund P Cherith Tewell PT 08/07/2020, 8:32 AM

## 2020-08-07 NOTE — Progress Notes (Signed)
Patient ID: Shawna Curry, female   DOB: 08/30/1933, 87 y.o.   MRN: [id number]  Met with pt and daughter who was here in the room, to discuss team conference goals min-mod level of assist and target discharge 9/17. Pt is making good progress in the past few days and hopes she has turned a corner. Daughter reports she has been here and seen Mom in therapies. Both she and brother aware pt will require 24 hr care at discharge form rehab. Will continue to work on discharge needs.  

## 2020-08-07 NOTE — Patient Care Conference (Signed)
Inpatient RehabilitationTeam Conference and Plan of Care Update Date: 08/07/2020   Time: 11:16 AM    Patient Name: Shawna Curry      Medical Record Number: [id number]  Date of Birth: [birth year] Sex: Female         Room/Bed: 4W23C/4W23C-01 Payor Info: Payor: HU[employer] MEDICARE / Plan: HU[employer] MEDICARE HMO / Product Type: *No Product type* /    Admit Date/Time:  08/02/2020  5:02 PM  Primary Diagnosis:  Right sided cerebral infarction [facility])  Hospital Problems: Principal Problem:   Right sided cerebral infarction [facility])    Expected Discharge Date: Expected Discharge Date: 08/23/20  Team Members Present: Physician leading conference: Dr. Leeroy Cha Care Coodinator Present: Dorien Chihuahua, RN, BSN, CRRN;Other (comment) Jacqlyn Larsen [place], SW) Nurse Present: Other (comment) Tommie Sams, RN) PT Present: Other (comment) (Christain Manhard, PT) OT Present: Meriel Pica, OT SLP Present: Other (comment) Nadara Mode, SLP) PPS Coordinator present : Ileana Ladd, Burna Mortimer, SLP     Current Status/Progress Goal Weekly Team Focus  Bowel/Bladder   Incontinent/continent at times; Last bm 8/30  Become continent x2  Assess every shift and as needed   Swallow/Nutrition/ Hydration             ADL's   supervision to min assist UB ADLs; mod to max assist LB ADLs; max assist to +2 functional transfers  min mod assist BADLs and functional transfers  transfers, standing balance and posture, ADL training   Mobility             Communication             Safety/Cognition/ Behavioral Observations  Mod-Max A  Min A  orientation with aids, recall with aids/strategies, basic familiar problem solving, sustained attention   Pain   Soreness to ribs 3/10; denied warm packs  Pain <3/10  Assess every shift and as needed   Skin   Wound to R great and 2nd toe  Prevent further breakdown; provide dressing changes per orders  Assess every shift and as needed     Discharge Planning:  Family working  on 24 hr care plan made aware pt will require 24 hr care at DC from rehab. Daughter and son have been here   Team Discussion: No medical issues reported. Note apraxia, right foot pain with better coordination.  Patient on target to meet rehab goals: yes, MOD assist goals  *See Care Plan and progress notes for long and short-term goals.   Revisions to Treatment Plan:   Teaching Needs: Supervision for medication management, safety awareness, transfers, toileting,wound care, etc.  Current Barriers to Discharge: Decreased caregiver support  Possible Resolutions to Barriers: Family education     Medical Summary Current Status: AKI, right foot pain, apraxia, hyperglycemia  Barriers to Discharge: Medication compliance;Decreased family/caregiver support  Barriers to Discharge Comments: Will require 24/7 care, AKI, right foot pain, apraxia, hyperglycemia Possible Resolutions to [employer] Focus: Family is trying to come up with plan for 24/7 care, Gabapentin 300mg  BID, monitor labs weekly, monitor CBGs AC/HS/carb modified diet   Continued Need for Acute Rehabilitation Level of Care: The patient requires daily medical management by a physician with specialized training in physical medicine and rehabilitation for the following reasons: Direction of a multidisciplinary physical rehabilitation program to maximize functional independence : Yes Medical management of patient stability for increased activity during participation in an intensive rehabilitation regime.: Yes Analysis of laboratory values and/or radiology reports with any subsequent need for medication adjustment and/or medical intervention. : Yes  I attest that I was present, lead the team conference, and concur with the assessment and plan of the team.   Dorien Chihuahua B 08/07/2020, 2:55 PM

## 2020-08-07 NOTE — Progress Notes (Signed)
Speech Language Pathology Daily Session Note  Patient Details  Name: Shawna Curry MRN: [id number] Date of Birth: 04/27/1933  Today's Date: 08/07/2020 SLP Individual Time: 1443-1540 SLP Individual Time Calculation (min): 45 min  Short Term Goals: Week 1: SLP Short Term Goal 1 (Week 1): Pt will demonstrate orientation to place, time and situtation with mod A verbal cues. SLP Short Term Goal 2 (Week 1): Pt will demonstrate daily and novel recall with external aids with mod A verbal cues. SLP Short Term Goal 3 (Week 1): Pt will demonstrate functional and basic problem solving with mod A verbal cues. SLP Short Term Goal 4 (Week 1): Pt will demonstrate 2 safety precautions due to acute CVA deficits with mod A verbal cues. SLP Short Term Goal 5 (Week 1): Pt will demonstrate sustained attention in 15 minute interval with min A verbal cues for redirection to functional task.  Skilled Therapeutic Interventions:   Patient seen by skilled ST treatment focusing on cognitive function goals. She required frequent verbal and visual cues for using calendar to orient self to date but was not able to retain information despite repetition. She correctly stated her birth year as [birth year] but was not able to state her age, and guessed it to be "89 something". Patient was not aware of making statements that did not make sense, such as saying her kids were "41 something" but that she had them when she was 86 years old. Patient stated place as "this is a nursing home" and thought she was in [place] (city she [place] in). Attention during structured tasks was adequate with frequent verbal cues to redirect in quiet environment. Patient continues to benefit from skilled ST treatment to maximize cognitive function prior to discharge.    Pain Pain Assessment Pain Scale: 0-10 Pain Score: 0-No pain Faces Pain Scale: No hurt  Therapy/Group: Individual Therapy  Sonia Baller, MA, CCC-SLP 08/07/20 4:13 PM

## 2020-08-07 NOTE — Progress Notes (Signed)
[place] PHYSICAL MEDICINE & REHABILITATION PROGRESS NOTE   Subjective/Complaints: No complaints.  States that right foot pain is better  ROS: denies constipation, insomnia, pain   Objective:   No results found. Recent Labs    08/05/20 0551  WBC 8.8  HGB 10.6*  HCT 32.7*  PLT 181   Recent Labs    08/05/20 0551  NA 140  K 4.5  CL 107  CO2 22  GLUCOSE 112*  BUN 21  CREATININE 1.19*  CALCIUM 8.5*    Intake/Output Summary (Last 24 hours) at 08/07/2020 1054 Last data filed at 08/07/2020 0900 Gross per 24 hour  Intake 540 ml  Output --  Net 540 ml     Physical Exam: Vital Signs Blood pressure 129/85, pulse 62, temperature 98.6 F (37 C), temperature source Oral, resp. rate 18, height 5\' 8"  (1.727 m), weight 52.8 kg, SpO2 98 %. General: Alert, No apparent distress HEENT: Head is normocephalic, atraumatic, PERRLA, EOMI, sclera anicteric, oral mucosa pink and moist, dentition intact, ext ear canals clear,  Neck: Supple without JVD or lymphadenopathy Heart: Reg rate and rhythm. No murmurs rubs or gallops Chest: CTA bilaterally without wheezes, rales, or rhonchi; no distress Abdomen: Soft, non-tender, non-distended, bowel sounds positive. Extremities: No clubbing, cyanosis, or edema. Pulses are 2+ Skin: Clean and intact without signs of breakdown She does have some difficulty naming objects with decrease in recall.   Follows simple commands. Motor: RUE: 3/5 proximal distally LUE: 4+/5 proximal distal Bilateral lower extremities: 4+/5 distal  Psychiatric:        Speech: Speech is tangential.        Cognition and Memory: Cognition is impaired. Memory is impaired.    Assessment/Plan: 1. Functional deficits secondary to right sided cerebral infarction which require 3+ hours per day of interdisciplinary therapy in a comprehensive inpatient rehab setting.  Physiatrist is providing close team supervision and 24 hour management of active medical problems listed  below.  Physiatrist and rehab team continue to assess barriers to discharge/monitor patient progress toward functional and medical goals  Care Tool:  Bathing    Body parts bathed by patient: Right arm, Left arm, Chest, Abdomen, Left lower leg, Right lower leg, Face, Right upper leg, Left upper leg   Body parts bathed by helper: Right arm, Left arm, Front perineal area, Buttocks, Right upper leg, Left upper leg, Right lower leg, Left lower leg Body parts n/a: Buttocks, Front perineal area   Bathing assist Assist Level: Supervision/Verbal cueing     Upper Body Dressing/Undressing Upper body dressing   What is the patient wearing?: Pull over shirt    Upper body assist Assist Level: Moderate Assistance - Patient 50 - 74%    Lower Body Dressing/Undressing Lower body dressing      What is the patient wearing?: Pants     Lower body assist Assist for lower body dressing: Maximal Assistance - Patient 25 - 49%     Toileting Toileting    Toileting assist Assist for toileting: Maximal Assistance - Patient 25 - 49%     Transfers Chair/bed transfer  Transfers assist     Chair/bed transfer assist level: 2 Helpers     Locomotion Ambulation   Ambulation assist   Ambulation activity did not occur: Safety/medical concerns  Assist level: 2 helpers Assistive device: Parallel bars Max distance: 10'   Walk 10 feet activity   Assist  Walk 10 feet activity did not occur: Safety/medical concerns  Assist level: 2 helpers Assistive device: Parallel  bars   Walk 50 feet activity   Assist Walk 50 feet with 2 turns activity did not occur: Safety/medical concerns         Walk 150 feet activity   Assist Walk 150 feet activity did not occur: Safety/medical concerns         Walk 10 feet on uneven surface  activity   Assist Walk 10 feet on uneven surfaces activity did not occur: Safety/medical concerns         Wheelchair     Assist Will patient use  wheelchair at discharge?: Yes (Per PT long-term goals ) Type of Wheelchair: Manual    Wheelchair assist level: Dependent - Patient 0% Max wheelchair distance: 150    Wheelchair 50 feet with 2 turns activity    Assist        Assist Level: Dependent - Patient 0%   Wheelchair 150 feet activity     Assist      Assist Level: Dependent - Patient 0%   Blood pressure 129/85, pulse 62, temperature 98.6 F (37 C), temperature source Oral, resp. rate 18, height 5\' 8"  (1.727 m), weight 52.8 kg, SpO2 98 %.  Medical Problem List and Plan: 1.  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  Antithrombotics: -DVT/anticoagulation: Intravenous heparin transitioned back to Eliquis             -antiplatelet therapy: N/A 3. Pain Management: Neurontin 300 mg twice daily  8/28: Denies pain. Decrease Gabapentin to just HS dose.   MSK CP sportscreme  8/31: reports increased right great toe pain. Increase Gabapentin back to 300mg  BID  9/1: pain is better controlled 4. Mood: Provide emotional support             -antipsychotic agents: N/A 5. Neuropsych: This patient is not capable of making decisions on her own behalf. 6. Skin/Wound Care: Routine skin checks 7. Fluids/Electrolytes/Nutrition: Routine in and outs.  CMP ordered. 8.  Atrial fibrillation.  Amiodarone 200 mg twice daily, Toprol-XL 25 mg daily. Will need outpatient f/u with cardiology. Reviewed their 8/27 note and says to maintain amiodarone. Well controlled             Monitor with increased exertion. 9.  CAD/non-STEMI.  Follow-up cardiology services 10.  Type 2 diabetes mellitus.  Hemoglobin A1c 6.2.  Patient on Glucophage 500  mg twice daily prior to admission.  Resume as needed  8/28: will not restart metformin yet given AKI  8/29: poorly controlled. Changed diet to carb modified.   8/31: better controlled.   9/1: Elevated to 155 yesterday evening. Otherwise well controlled.              Monitor with increased mobility. 11.  Acute on chronic systolic congestive heart failure.  Lasix 40 mg daily.  Monitor for any signs of fluid overload             Daily weights. 12.  History of CEA as well as left carotid stenosis.  Follow-up vascular surgery 13.  Hyperlipidemia: Lipitor 14.  AKI.  Creatinine improving. 1.19 on 8/30    LOS: 5 days A FACE TO FACE EVALUATION WAS PERFORMED  Clide Deutscher Nagee Goates 08/07/2020, 10:54 AM

## 2020-08-07 NOTE — Progress Notes (Signed)
Occupational Therapy Session Note  Patient Details  Name: Shawna Curry MRN: [id number] Date of Birth: 04/08/1933  Today's Date: 08/07/2020 OT Individual Time: 1405-1505 OT Individual Time Calculation (min): 60 min    Short Term Goals: Week 1:  OT Short Term Goal 1 (Week 1): Pt will demonstrate improve R hand coordination to be able to bring a spoon to her mouth with 50% accuracy. OT Short Term Goal 2 (Week 1): Pt will demonstrate improved coordination and praxis to wash UB with min A. OT Short Term Goal 3 (Week 1): Pt will demonstrate improved coordination and praxis to don a shirt with max A. OT Short Term Goal 4 (Week 1): pt will be able to sit at EOB with close S for 5 minutes to prepare for a transfer. OT Short Term Goal 5 (Week 1): Pt will be able to rise to stand in a stedy lift with max A of 1 to demonstrate improved attention.  Skilled Therapeutic Interventions/Progress Updates:  Patient met lying supine in bed in agreement with OT treatment session with focus on self-car re-education, functional transfers and NMR as detailed below. Patient c/o pain in R great toe 2/2 diabetic ulcer. Shoes donned with Max A. Patient incontinent of bladder requiring Mod A for supine <> EOB and Max A for STS in Stedy. Max A toilet transfer with multimodal cues for posture and orientation to midline with patient pushing to R. Seated on BSC, patient able to wash front perineal area with supervision A. Max A to doff LB clothing and don new brief and pants with Max A for STS in Stedy to hike pants over hips. In perched position in Stedy, patient completed hand hygiene at sink with multimodal cues for sequencing and attention to task. NMR in perched position at sink level for visual input from mirror. Patient able to attain midline position for brief periods of time with maximal multimodal cues for restraining LUE and maintaining position of feet in Stedy. Session concluded with patient supine in bed with call bell  within reach, bed alarm activated, and all needs met. Female family member present at bedside throughout session.   Therapy Documentation Precautions:  Precautions Precautions: Fall Precaution Comments: truncal and bil UE ataxia, severely impaired motor planning Restrictions Weight Bearing Restrictions: No General:    Therapy/Group: Individual Therapy  [name] 08/07/2020, 3:17 PM  

## 2020-08-07 NOTE — Progress Notes (Signed)
F/S- 57 at 1138. 6 oz of Sprite given and F/S 57 at 1157. 3 oz of apple juice given and F/S 69 at 1227. F/S 90 at 1249 after lunch. Patient had eaten all her am meal and ate most of her lunch. No coverage has been given this shift.

## 2020-08-08 ENCOUNTER — Inpatient Hospital Stay (HOSPITAL_COMMUNITY): Payer: Medicare HMO | Admitting: Occupational Therapy

## 2020-08-08 ENCOUNTER — Inpatient Hospital Stay (HOSPITAL_COMMUNITY): Payer: Medicare HMO

## 2020-08-08 ENCOUNTER — Inpatient Hospital Stay (HOSPITAL_COMMUNITY): Payer: Medicare HMO | Admitting: Speech Pathology

## 2020-08-08 LAB — GLUCOSE, CAPILLARY
Glucose-Capillary: 86 mg/dL (ref 70–99)
Glucose-Capillary: 55 mg/dL — ABNORMAL LOW (ref 70–99)
Glucose-Capillary: 102 mg/dL — ABNORMAL HIGH (ref 70–99)
Glucose-Capillary: 159 mg/dL — ABNORMAL HIGH (ref 70–99)
Glucose-Capillary: 118 mg/dL — ABNORMAL HIGH (ref 70–99)

## 2020-08-08 MED FILL — Medication: Qty: 1 | Status: AC

## 2020-08-08 NOTE — Progress Notes (Signed)
Occupational Therapy Session Note  Patient Details  Name: Shawna Curry MRN: [id number] Date of Birth: 13-Jul-1933  Today's Date: 08/08/2020 OT Individual Time: 0935-1030 OT Individual Time Calculation (min): 55 min    Short Term Goals: Week 1:  OT Short Term Goal 1 (Week 1): Pt will demonstrate improve R hand coordination to be able to bring a spoon to her mouth with 50% accuracy. OT Short Term Goal 2 (Week 1): Pt will demonstrate improved coordination and praxis to wash UB with min A. OT Short Term Goal 3 (Week 1): Pt will demonstrate improved coordination and praxis to don a shirt with max A. OT Short Term Goal 4 (Week 1): pt will be able to sit at EOB with close S for 5 minutes to prepare for a transfer. OT Short Term Goal 5 (Week 1): Pt will be able to rise to stand in a stedy lift with max A of 1 to demonstrate improved attention.  Skilled Therapeutic Interventions/Progress Updates:    Pt received in bed with +2 A for the first 30 minutes.  Pt had on a very wet brief.  With +2 A, pt sat to EOB with mod A to avoid posterior lean in sitting, then pt able to sit upright.  Initially tried to have pt stand with a RW and she was able to pull up but could never achieve (even after multiple tries) an upright position to be able to transfer with RW.  Instead had pt squat pivot with max of 2. Pt continues to lean back and push her feet forward. It is very difficult to have pt keep her feet in place.    Used sink for visual reference for positioning.  Pt stood up to sink with mod A but could not fully stand, pushing back through her hips.  It required, max A of 2 to hold her balance while receiving total A to doff wet brief, cleanse bottom, don new brief, and pull pants on.  Pt sat down 3 x between each step - she self cleansed perineal area.    Mod A with UB b/d.   Worked on Lexicographer - with B hands clasped with max A to guide movement and then min A but pt unable to continue movements without A.   (simple pushing and pulling).    Pt resting in wlc with belt alarm on. All needs met.   Therapy Documentation Precautions:  Precautions Precautions: Fall Precaution Comments: truncal and bil UE ataxia, severely impaired motor planning Restrictions Weight Bearing Restrictions: No       Pain: c/o R toe pain - premedicated   ADL: ADL Eating: Moderate assistance Grooming: Moderate assistance Upper Body Bathing: Moderate assistance Lower Body Bathing: Dependent Upper Body Dressing: Maximal assistance Lower Body Dressing: Dependent Toileting: Not assessed Toilet Transfer: Not assessed  Therapy/Group: Individual Therapy  Mckale Haffey 08/08/2020, 12:43 PM

## 2020-08-08 NOTE — Progress Notes (Signed)
Physical Therapy Session Note  Patient Details  Name: Shawna Curry MRN: [id number] Date of Birth: 1933/06/30  Today's Date: 08/08/2020 PT Individual Time: 1300-1345 PT Individual Time Calculation (min): 45 min   Short Term Goals: Week 1:  PT Short Term Goal 1 (Week 1): pt will sit EOB with min assist up to 5 minutes PT Short Term Goal 2 (Week 1): Pt will trasnfer to and from [facility] with max assist of 1 PT Short Term Goal 3 (Week 1): Pt will perform bed mobility with max assist PT Short Term Goal 4 (Week 1): Pt will initated self propelled WC mobility  Skilled Therapeutic Interventions/Progress Updates:    Pt received sitting in w/c, finishing up her meal, agreeable to PT session. Pt reports mild R great toe pain. Pt noted to be soiled on arrival. Spoke with NT and RN regarding concern for frequent incontinent episodes and asked for toileting schedule, RN agreeable. Performed sit<>stand with modA+2 to [facility] to perched position. TotalA for doffing/donning soiled briefs, pants, and shoes. Pt handed clean washcloth and she was able to perform pericare with setupA. Transported in w/c to main therapy gym with [place] for time management. Placed in // bars and performed sit<>stands from w/c level with modA with BUE's on // bars. Attempted to progress to 1 hand on armrest and 1 hand on // bar for sit<>stands, requiring maxA 2/2 significant posterior lean. Pt ambulated length of // bars with CGA, demo's step-to gait pattern with posterior lean, R foot notably internally rotated however this may likely be baseline. Pt transported back to her room with totalA and remained seated in w/c with belt alarm on, needs in reach at end of session.  Therapy Documentation Precautions:  Precautions Precautions: Fall Precaution Comments: truncal and bil UE ataxia, severely impaired motor planning Restrictions Weight Bearing Restrictions: No  Therapy/Group: Individual Therapy  Charlottie Peragine P Kennieth Plotts PT 08/08/2020, 1:26 PM

## 2020-08-08 NOTE — Progress Notes (Addendum)
Occupational Therapy Session Note  Patient Details  Name: Shawna Curry MRN: [id number] Date of Birth: [birth year]  Today's Date: 08/08/2020 OT Individual Time: 1430-1500 OT Individual Time Calculation (min): 30 min    Short Term Goals: Week 1:  OT Short Term Goal 1 (Week 1): Pt will demonstrate improve R hand coordination to be able to bring a spoon to her mouth with 50% accuracy. OT Short Term Goal 2 (Week 1): Pt will demonstrate improved coordination and praxis to wash UB with min A. OT Short Term Goal 3 (Week 1): Pt will demonstrate improved coordination and praxis to don a shirt with max A. OT Short Term Goal 4 (Week 1): pt will be able to sit at EOB with close S for 5 minutes to prepare for a transfer. OT Short Term Goal 5 (Week 1): Pt will be able to rise to stand in a stedy lift with max A of 1 to demonstrate improved attention.  Skilled Therapeutic Interventions/Progress Updates:    Pt sitting up in w/c, no c/o pain.  OT session focused on RUE strengthening and motor coordination tasks to increase independence with self care.  Assessed MMT RUE: shoulder scaption 2-/5; shoulder elevation (shrug) 3+/5, elbow flex 3/5, wrist extension 3/5, grip 3+/5.  Pt completed shoulder FF using weightless dowel AAROM, biceps curls using 1 lb free weight 3 x 15 reps each exercise.  Pt needed TCs and VCs intermittently to improve sequencing and body mechanics and to keep pt on task.  Pt participated in weightless ball toss using BUE 3 x 15 reps.  Pt used arm rest to support RUE under elbow to complete ball toss due to shoulder weakness.  Call bell in reach, seat alarm on. Significant shoulder weakness noted compared to rest of RUE.  Therapy Documentation Precautions:  Precautions Precautions: Fall Precaution Comments: truncal and bil UE ataxia, severely impaired motor planning Restrictions Weight Bearing Restrictions: No   Therapy/Group: Individual Therapy  Ezekiel Slocumb 08/08/2020, 3:14 PM

## 2020-08-08 NOTE — Progress Notes (Signed)
[facility] PHYSICAL MEDICINE & REHABILITATION PROGRESS NOTE   Subjective/Complaints: Right foot still sore.  No other pain. Moving bowels regularly. Eating well.   ROS: denies constipation, insomnia, pain   Objective:   No results found. No results for input(s): WBC, HGB, HCT, PLT in the last 72 hours. No results for input(s): NA, K, CL, CO2, GLUCOSE, BUN, CREATININE, CALCIUM in the last 72 hours.  Intake/Output Summary (Last 24 hours) at 08/08/2020 0852 Last data filed at 08/08/2020 0815 Gross per 24 hour  Intake 1020 ml  Output 25 ml  Net 995 ml     Physical Exam: Vital Signs Blood pressure 128/71, pulse 72, temperature 98.1 F (36.7 C), resp. rate 18, height 5\' 8"  (1.727 m), weight 52.8 kg, SpO2 100 %. General: Alert and oriented x 3, No apparent distress HEENT: Head is normocephalic, atraumatic, PERRLA, EOMI, sclera anicteric, oral mucosa pink and moist, dentition intact, ext ear canals clear,  Neck: Supple without JVD or lymphadenopathy Heart: Reg rate and rhythm. No murmurs rubs or gallops Chest: CTA bilaterally without wheezes, rales, or rhonchi; no distress Abdomen: Soft, non-tender, non-distended, bowel sounds positive. Skin: Clean and intact without signs of breakdown She does have some difficulty naming objects with decrease in recall.   Follows simple commands. Motor: RUE: 3/5 proximal distally LUE: 4+/5 proximal distal Bilateral lower extremities: 4+/5 distal  Psychiatric:        Speech: Speech is tangential.        Cognition and Memory: Cognition is impaired. Memory is impaired.     Assessment/Plan: 1. Functional deficits secondary to right sided cerebral infarction which require 3+ hours per day of interdisciplinary therapy in a comprehensive inpatient rehab setting.  Physiatrist is providing close team supervision and 24 hour management of active medical problems listed below.  Physiatrist and rehab team continue to assess barriers to  discharge/monitor patient progress toward functional and medical goals  Care Tool:  Bathing    Body parts bathed by patient: Right arm, Left arm, Chest, Abdomen, Left lower leg, Right lower leg, Face, Right upper leg, Left upper leg   Body parts bathed by helper: Right arm, Left arm, Front perineal area, Buttocks, Right upper leg, Left upper leg, Right lower leg, Left lower leg Body parts n/a: Buttocks, Front perineal area   Bathing assist Assist Level: Supervision/Verbal cueing     Upper Body Dressing/Undressing Upper body dressing   What is the patient wearing?: Pull over shirt    Upper body assist Assist Level: Moderate Assistance - Patient 50 - 74%    Lower Body Dressing/Undressing Lower body dressing      What is the patient wearing?: Pants, Underwear/pull up     Lower body assist Assist for lower body dressing: Maximal Assistance - Patient 25 - 49%     Toileting Toileting    Toileting assist Assist for toileting: Maximal Assistance - Patient 25 - 49% (Stedy)     Transfers Chair/bed transfer  Transfers assist     Chair/bed transfer assist level: Maximal Assistance - Patient 25 - 49% (Stedy)     Locomotion Ambulation   Ambulation assist   Ambulation activity did not occur: Safety/medical concerns  Assist level: 2 helpers Assistive device: Parallel bars Max distance: 10'   Walk 10 feet activity   Assist  Walk 10 feet activity did not occur: Safety/medical concerns  Assist level: 2 helpers Assistive device: Parallel bars   Walk 50 feet activity   Assist Walk 50 feet with 2 turns activity  did not occur: Safety/medical concerns         Walk 150 feet activity   Assist Walk 150 feet activity did not occur: Safety/medical concerns         Walk 10 feet on uneven surface  activity   Assist Walk 10 feet on uneven surfaces activity did not occur: Safety/medical concerns         Wheelchair     Assist Will patient use  wheelchair at discharge?: Yes (Per PT long-term goals ) Type of Wheelchair: Manual    Wheelchair assist level: Dependent - Patient 0% Max wheelchair distance: 150    Wheelchair 50 feet with 2 turns activity    Assist        Assist Level: Dependent - Patient 0%   Wheelchair 150 feet activity     Assist      Assist Level: Dependent - Patient 0%   Blood pressure 128/71, pulse 72, temperature 98.1 F (36.7 C), resp. rate 18, height 5\' 8"  (1.727 m), weight 52.8 kg, SpO2 100 %.  Medical Problem List and Plan: 1.  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  Antithrombotics: -DVT/anticoagulation: Intravenous heparin transitioned back to Eliquis             -antiplatelet therapy: N/A 3. Pain Management: Neurontin 300 mg twice daily  8/28: Denies pain. Decrease Gabapentin to just HS dose.   MSK CP sportscreme  8/31: reports increased right great toe pain. Increase Gabapentin back to 300mg  BID  9/1: pain is better controlled  9/2: add ice for right foot 4. Mood: Provide emotional support             -antipsychotic agents: N/A 5. Neuropsych: This patient is not capable of making decisions on her own behalf. 6. Skin/Wound Care: Routine skin checks 7. Fluids/Electrolytes/Nutrition: Routine in and outs.  CMP ordered. 8.  Atrial fibrillation.  Amiodarone 200 mg twice daily, Toprol-XL 25 mg daily. Will need outpatient f/u with cardiology. Reviewed their 8/27 note and says to maintain amiodarone. Well controlled             Monitor with increased exertion. 9.  CAD/non-STEMI.  Follow-up cardiology services 10.  Type 2 diabetes mellitus.  Hemoglobin A1c 6.2.  Patient on Glucophage 500 mg  twice daily prior to admission.  Resume as needed  8/28: will not restart metformin yet given AKI  8/29: poorly controlled. Changed diet to carb modified.   9/2: well controlled              Monitor with increased mobility. 11.  Acute on chronic systolic congestive heart failure.  Lasix 40 mg daily.  Monitor for any signs of fluid overload             Daily weights. 12.  History of CEA as well as left carotid stenosis.  Follow-up vascular surgery 13.  Hyperlipidemia: Lipitor 14.  AKI.              Creatinine improving. 1.19 on 8/30    LOS: 6 days A FACE TO FACE EVALUATION WAS PERFORMED  Martha Clan P Tattiana Fakhouri 08/08/2020, 8:52 AM

## 2020-08-08 NOTE — Progress Notes (Signed)
Speech Language Pathology Daily Session Note  Patient Details  Name: Shawna Curry MRN: [id number] Date of Birth: [birth year]  Today's Date: 08/08/2020 SLP Individual Time: 0800-0900 SLP Individual Time Calculation (min): 60 min  Short Term Goals: Week 1: SLP Short Term Goal 1 (Week 1): Pt will demonstrate orientation to place, time and situtation with mod A verbal cues. SLP Short Term Goal 2 (Week 1): Pt will demonstrate daily and novel recall with external aids with mod A verbal cues. SLP Short Term Goal 3 (Week 1): Pt will demonstrate functional and basic problem solving with mod A verbal cues. SLP Short Term Goal 4 (Week 1): Pt will demonstrate 2 safety precautions due to acute CVA deficits with mod A verbal cues. SLP Short Term Goal 5 (Week 1): Pt will demonstrate sustained attention in 15 minute interval with min A verbal cues for redirection to functional task.  Skilled Therapeutic Interventions:   Patient seen for skilled ST therapy with focus on cognitive skills. Patient did not recall or recognize SLP. She oriented to time by looking at clock without verbal cue to do so but required setup assistance for using calendar to orient to date, day of week and year. She was able to complete mildly complex task of locating food items in a calorie counting book to find calories to make a sandwich and was able to calculate in head when needing to double an amount. She required SLP to open page to correct section/headings (ie: 'Vegetables and Fruits') but she was able to then locate the specific food item. She did not notice errors in addition of four numbers but her answer was partially correct. Shawna Curry continues to not be oriented to her own age, her children's ages and even when SLP showed her the ages (her daughter had put this information on a paper we were using for orientation yesterday), she did not demonstrate awareness or recall.   Pain Pain Assessment Pain Scale: 0-10 Pain Score: 0-No  pain Pain Type: Acute pain Pain Location: Toe (Comment which one) Pain Descriptors / Indicators: Aching Pain Onset: On-going Patients Stated Pain Goal: 0 Pain Intervention(s): Medication (See eMAR)  Therapy/Group: Individual Therapy  Sonia Baller, MA, CCC-SLP 08/08/20 3:32 PM

## 2020-08-08 NOTE — Plan of Care (Signed)
°  Problem: Consults Goal: RH STROKE PATIENT EDUCATION Description: See Patient Education module for education specifics  Outcome: Progressing Goal: Diabetes Guidelines if Diabetic/Glucose > 140 Description: If diabetic or lab glucose is > 140 mg/dl - Initiate Diabetes/Hyperglycemia Guidelines & Document Interventions  Outcome: Progressing   Problem: RH BLADDER ELIMINATION Goal: RH STG MANAGE BLADDER WITH ASSISTANCE Description: STG Manage Bladder With min Assistance Outcome: Progressing   Problem: RH SKIN INTEGRITY Goal: RH STG ABLE TO PERFORM INCISION/WOUND CARE W/ASSISTANCE Description: STG Able To Perform Incision/Wound Care With mod Assistance. Outcome: Progressing   Problem: RH SAFETY Goal: RH STG ADHERE TO SAFETY PRECAUTIONS W/ASSISTANCE/DEVICE Description: STG Adhere to Safety Precautions With supervision Assistance/Device. Outcome: Progressing   Problem: RH COGNITION-NURSING Goal: RH STG ANTICIPATES NEEDS/CALLS FOR ASSIST W/ASSIST/CUES Description: STG Anticipates Needs/Calls for Assist With supervision Assistance/Cues. Outcome: Progressing   Problem: RH KNOWLEDGE DEFICIT Goal: RH STG INCREASE KNOWLEDGE OF DIABETES Description: Pt/daughter will be able to demonstrate understanding of DM management with diet control and medication compliance using handouts/booklets with supervision assist.  Outcome: Progressing Goal: RH STG INCREASE KNOWLEDGE OF HYPERTENSION Description: Pt/daughter will be able to demonstrate understanding of HTN management with diet control and medication compliance using handouts/booklets with supervision assist.  Outcome: Progressing Goal: RH STG INCREASE KNOWLEGDE OF HYPERLIPIDEMIA Description: Pt/daughter will be able to demonstrate understanding of HLD management with diet control and medication compliance using handouts/booklets with supervision assist.  Outcome: Progressing Goal: RH STG INCREASE KNOWLEDGE OF STROKE PROPHYLAXIS Description:  Pt/daughter will be able to demonstrate understanding of stroke prevention with diet control and medication compliance using handouts/booklets with supervision assist.  Outcome: Progressing   Problem: RH KNOWLEDGE DEFICIT Goal: RH STG INCREASE KNOWLEDGE OF DIABETES Description: Pt/daughter will be able to demonstrate understanding of DM management with diet control and medication compliance using handouts/booklets with supervision assist.  Outcome: Progressing Goal: RH STG INCREASE KNOWLEDGE OF HYPERTENSION Description: Pt/daughter will be able to demonstrate understanding of HTN management with diet control and medication compliance using handouts/booklets with supervision assist.  Outcome: Progressing Goal: RH STG INCREASE KNOWLEGDE OF HYPERLIPIDEMIA Description: Pt/daughter will be able to demonstrate understanding of HLD management with diet control and medication compliance using handouts/booklets with supervision assist.  Outcome: Progressing Goal: RH STG INCREASE KNOWLEDGE OF STROKE PROPHYLAXIS Description: Pt/daughter will be able to demonstrate understanding of stroke prevention with diet control and medication compliance using handouts/booklets with supervision assist.  Outcome: Progressing

## 2020-08-09 ENCOUNTER — Inpatient Hospital Stay (HOSPITAL_COMMUNITY): Payer: Medicare HMO

## 2020-08-09 ENCOUNTER — Inpatient Hospital Stay (HOSPITAL_COMMUNITY): Payer: Medicare HMO | Admitting: Occupational Therapy

## 2020-08-09 ENCOUNTER — Inpatient Hospital Stay (HOSPITAL_COMMUNITY): Payer: Medicare HMO | Admitting: Speech Pathology

## 2020-08-09 LAB — GLUCOSE, CAPILLARY
Glucose-Capillary: 91 mg/dL (ref 70–99)
Glucose-Capillary: 98 mg/dL (ref 70–99)
Glucose-Capillary: 110 mg/dL — ABNORMAL HIGH (ref 70–99)
Glucose-Capillary: 115 mg/dL — ABNORMAL HIGH (ref 70–99)

## 2020-08-09 NOTE — Progress Notes (Signed)
[facility] PHYSICAL MEDICINE & REHABILITATION PROGRESS NOTE   Subjective/Complaints: Right foot still with some pain. Says she has not been drinking more water. Vitals stable.  Watching football  ROS: denies constipation, insomnia, + pain   Objective:   No results found. No results for input(s): WBC, HGB, HCT, PLT in the last 72 hours. No results for input(s): NA, K, CL, CO2, GLUCOSE, BUN, CREATININE, CALCIUM in the last 72 hours.  Intake/Output Summary (Last 24 hours) at 08/09/2020 0922 Last data filed at 08/08/2020 1900 Gross per 24 hour  Intake 600 ml  Output --  Net 600 ml     Physical Exam: Vital Signs Blood pressure 131/71, pulse 60, temperature (!) 97.5 F (36.4 C), resp. rate 18, height 5\' 8"  (1.727 m), weight 52.2 kg, SpO2 100 %. General: Alert and oriented x 3, No apparent distress HEENT: Head is normocephalic, atraumatic, PERRLA, EOMI, sclera anicteric, oral mucosa pink and moist, dentition intact, ext ear canals clear,  Neck: Supple without JVD or lymphadenopathy Heart: Reg rate and rhythm. No murmurs rubs or gallops Chest: CTA bilaterally without wheezes, rales, or rhonchi; no distress Abdomen: Soft, non-tender, non-distended, bowel sounds positive. Extremities: No clubbing, cyanosis, or edema. Pulses are 2+ Skin: Clean and intact without signs of breakdown Neuro: She does have some difficulty naming objects with decrease in recall.   Follows simple commands. Motor: RUE: 3/5 proximal distally LUE: 4+/5 proximal distal Bilateral lower extremities: 4+/5 distal  Psychiatric:        Speech: Speech is tangential.        Cognition and Memory: Cognition is impaired. Memory is impaired.      Assessment/Plan: 1. Functional deficits secondary to right sided cerebral infarction which require 3+ hours per day of interdisciplinary therapy in a comprehensive inpatient rehab setting.  Physiatrist is providing close team supervision and 24 hour management of active  medical problems listed below.  Physiatrist and rehab team continue to assess barriers to discharge/monitor patient progress toward functional and medical goals  Care Tool:  Bathing    Body parts bathed by patient: Right arm, Left arm, Chest, Abdomen, Left lower leg, Right lower leg, Face, Right upper leg, Left upper leg   Body parts bathed by helper: Right arm, Left arm, Front perineal area, Buttocks, Right upper leg, Left upper leg, Right lower leg, Left lower leg Body parts n/a: Buttocks, Front perineal area   Bathing assist Assist Level: Supervision/Verbal cueing     Upper Body Dressing/Undressing Upper body dressing   What is the patient wearing?: Pull over shirt    Upper body assist Assist Level: Moderate Assistance - Patient 50 - 74%    Lower Body Dressing/Undressing Lower body dressing      What is the patient wearing?: Pants, Underwear/pull up     Lower body assist Assist for lower body dressing: Maximal Assistance - Patient 25 - 49%     Toileting Toileting    Toileting assist Assist for toileting: Maximal Assistance - Patient 25 - 49% (Stedy)     Transfers Chair/bed transfer  Transfers assist     Chair/bed transfer assist level: Maximal Assistance - Patient 25 - 49% (Stedy)     Locomotion Ambulation   Ambulation assist   Ambulation activity did not occur: Safety/medical concerns  Assist level: 2 helpers Assistive device: Parallel bars Max distance: 10'   Walk 10 feet activity   Assist  Walk 10 feet activity did not occur: Safety/medical concerns  Assist level: 2 helpers Assistive device: Parallel  bars   Walk 50 feet activity   Assist Walk 50 feet with 2 turns activity did not occur: Safety/medical concerns         Walk 150 feet activity   Assist Walk 150 feet activity did not occur: Safety/medical concerns         Walk 10 feet on uneven surface  activity   Assist Walk 10 feet on uneven surfaces activity did not occur:  Safety/medical concerns         Wheelchair     Assist Will patient use wheelchair at discharge?: Yes (Per PT long-term goals ) Type of Wheelchair: Manual    Wheelchair assist level: Dependent - Patient 0% Max wheelchair distance: 150    Wheelchair 50 feet with 2 turns activity    Assist        Assist Level: Dependent - Patient 0%   Wheelchair 150 feet activity     Assist      Assist Level: Dependent - Patient 0%   Blood pressure 131/71, pulse 60, temperature (!) 97.5 F (36.4 C), resp. rate 18, height 5\' 8"  (1.727 m), weight 52.2 kg, SpO2 100 %.  Medical Problem List and Plan: 1.  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  Antithrombotics: -DVT/anticoagulation: Intravenous heparin transitioned back to Eliquis             -antiplatelet therapy: N/A 3. Pain Management: Neurontin 300 mg twice daily  8/28: Denies pain. Decrease Gabapentin to just HS dose.   MSK CP sportscreme  8/31: reports increased right great toe pain. Increase Gabapentin back to 300mg  BID  9/3: stable 4. Mood: Provide emotional support             -antipsychotic agents: N/A 5. Neuropsych: This patient is not capable of making decisions on her own behalf. 6. Skin/Wound Care: Routine skin checks 7. Fluids/Electrolytes/Nutrition: Routine in and outs.  CMP ordered. 8.  Atrial fibrillation.  Amiodarone 200 mg twice daily, Toprol-XL 25 mg daily. Will need outpatient f/u with cardiology. Reviewed their 8/27 note and says to maintain amiodarone. Excellent control             Monitor with increased exertion. 9.  CAD/non-STEMI.  Follow-up cardiology services 10.  Type 2 diabetes mellitus.  Hemoglobin  A1c 6.2.  Patient on Glucophage 500 mg twice daily prior to admission.  Resume as needed  8/28: will not restart metformin yet given AKI  8/29: poorly controlled. Changed diet to carb modified.   9/3: relatively well controlled             Monitor with increased mobility. 11.  Acute on chronic systolic congestive heart failure.  Lasix 40 mg daily.  Monitor for any signs of fluid overload             Daily weights. 12.  History of CEA as well as left carotid stenosis.  Follow-up vascular surgery 13.  Hyperlipidemia: Lipitor 14.  AKI.              Creatinine improving. 1.19 on 8/30    LOS: 7  days A FACE TO FACE EVALUATION WAS PERFORMED  Rawn Quiroa P Shariff Lasky 08/09/2020, 9:22 AM

## 2020-08-09 NOTE — Plan of Care (Signed)
  Problem: Consults Goal: RH STROKE PATIENT EDUCATION Description: See Patient Education module for education specifics  Outcome: Progressing Goal: Diabetes Guidelines if Diabetic/Glucose > 140 Description: If diabetic or lab glucose is > 140 mg/dl - Initiate Diabetes/Hyperglycemia Guidelines & Document Interventions  Outcome: Progressing   Problem: RH BLADDER ELIMINATION Goal: RH STG MANAGE BLADDER WITH ASSISTANCE Description: STG Manage Bladder With min Assistance Outcome: Progressing   Problem: RH SKIN INTEGRITY Goal: RH STG ABLE TO PERFORM INCISION/WOUND CARE W/ASSISTANCE Description: STG Able To Perform Incision/Wound Care With mod Assistance. Outcome: Progressing   Problem: RH SAFETY Goal: RH STG ADHERE TO SAFETY PRECAUTIONS W/ASSISTANCE/DEVICE Description: STG Adhere to Safety Precautions With supervision Assistance/Device. Outcome: Progressing

## 2020-08-09 NOTE — Progress Notes (Signed)
Speech Language Pathology Weekly Progress and Session Note  Patient Details  Name: Shawna Curry MRN: [id number] Date of Birth: [birth year]  Beginning of progress report period: August 03, 2020 End of progress report period: August 09, 2020  Today's Date: 08/09/2020 SLP Individual Time: 1130-1230 SLP Individual Time Calculation (min): 60 min  Short Term Goals: Week 1: SLP Short Term Goal 1 (Week 1): Pt will demonstrate orientation to place, time and situtation with mod A verbal cues. SLP Short Term Goal 1 - Progress (Week 1): Not met SLP Short Term Goal 2 (Week 1): Pt will demonstrate daily and novel recall with external aids with mod A verbal cues. SLP Short Term Goal 2 - Progress (Week 1): Not met SLP Short Term Goal 3 (Week 1): Pt will demonstrate functional and basic problem solving with mod A verbal cues. SLP Short Term Goal 3 - Progress (Week 1): Met SLP Short Term Goal 4 (Week 1): Pt will demonstrate 2 safety precautions due to acute CVA deficits with mod A verbal cues. SLP Short Term Goal 4 - Progress (Week 1): Not met SLP Short Term Goal 5 (Week 1): Pt will demonstrate sustained attention in 15 minute interval with min A verbal cues for redirection to functional task. SLP Short Term Goal 5 - Progress (Week 1): Partly met    New Short Term Goals: Week 2: SLP Short Term Goal 1 (Week 2): Patient will perform basic functional ADL tasks with min A and 80% accuracy. SLP Short Term Goal 2 (Week 2): Patient will orient to time/situation/place with min A for use of visual/written aids after set up assistance. SLP Short Term Goal 3 (Week 2): Patient will perform selective attention task for increments of 5 minutes with min-modA for redirection.  Weekly Progress Updates:  Patient met 1/5 STG's and partly met one. She has demonstrated progress overall but continues to require mod-maximal frequency of cues for orientation to time/place and some biographical information (ages of children,  number of years she worked; she stated 20 and it was 40 years, etc). Discharge plan will be home with daughter and goals of supervision to [facility] for safety with basic ADL's. Continue with Plan of care.    Intensity: Minumum of 1-2 x/day, 30 to 90 minutes Frequency: 3 to 5 out of 7 days Duration/Length of Stay: 12-16 days Treatment/Interventions: Cognitive remediation/compensation;Cueing hierarchy;Functional tasks;Patient/family education;Internal/external aids   Daily Session  Skilled Therapeutic Interventions: Patient seen by SLP to address cognitive-linguistic goals and with daughter present during second half of session. Patient able to utilize calendar to orient to day of week and date and month after SLP set up but required moderate cues to orient to year. She performed selective attention task of finding food items in calorie counting book, requiring SLP to direct her to page of book prior to her being able to search for food item. Patient did require moderate frequency of verbal cues for attention as she would forget what she was looking for. Daughter stated that patient's memory has declined as compared to prior to recent hospitalizations and this current hospitalization, but she did mention patient would forget which cabinet a particular item was and she would spend a lot of time searching the kitchen.  Patient continues to benefit from skilled ST to maximize cognitive-linguistic function prior to discharge.   General    Pain Pain Assessment Pain Scale: 0-10 Pain Score: 0-No pain  Therapy/Group: Individual Therapy  Sonia Baller, MA, CCC-SLP 08/09/20 4:22 PM

## 2020-08-09 NOTE — Progress Notes (Signed)
Physical Therapy Session Note  Patient Details  Name: Schylar Allard MRN: [id number] Date of Birth: [birth year]  Today's Date: 08/09/2020 PT Individual Time: 1001-1056 PT Individual Time Calculation (min): 55 min   Short Term Goals: Week 1:  PT Short Term Goal 1 (Week 1): pt will sit EOB with min assist up to 5 minutes PT Short Term Goal 2 (Week 1): Pt will trasnfer to and from [facility] with max assist of 1 PT Short Term Goal 3 (Week 1): Pt will perform bed mobility with max assist PT Short Term Goal 4 (Week 1): Pt will initated self propelled WC mobility  Skilled Therapeutic Interventions/Progress Updates: Pt presents sitting in w/c and agreeable to therapy.  Pt wheeled to gym for time conservation.  Pt performed squat pivot transfer w/ max A as pt tends to retropulse.  Pt sits at edge of mat w/o support once in position.  Pt performed multiple seated T-ball rolls forward to promote trunk flexion. Pt attempted multiple sit to stand transfers to back of chair for goal and still requires max A but somewhat decreased retropulsion, blocking of feet.  Pt performed transfers to RW w/ same assist.  Once pt assisted to stand, verbal and manual cues,  including 2 point pressure improved to upright posture.  Pt amb multiple trials w/ RW and min A up to 35'.  Pt requires verbal cues for walker management, posture and R LE placement approx 60% of time.  Pt required seated rest breaks between trials and mod to max A for sit <> stand w/ verbal and visual cues for hand placement..  Pt returned to room and remained in w/c.  Chair alarm on and all needs in reach.     Therapy Documentation Precautions:  Precautions Precautions: Fall Precaution Comments: truncal and bil UE ataxia, severely impaired motor planning Restrictions Weight Bearing Restrictions: No General:   Vital Signs:  Pain: 10/10 R big toe post-rx.         Therapy/Group: Individual Therapy  Ladoris Gene 08/09/2020, 10:56 AM

## 2020-08-09 NOTE — Progress Notes (Signed)
Occupational Therapy Weekly Progress Note  Patient Details  Name: Shawna Curry MRN: [id number] Date of Birth: 1933/05/20  Beginning of progress report period: August 03, 2020 End of progress report period: August 09, 2020  Today's Date: 08/09/2020 OT Individual Time: 2542-7062 OT Individual Time Calculation (min): 60 min    Patient has met 5 of 5 short term goals.  Pt has made some good progress this week with her coordination and motor planning with familiar tasks such as eating, bathing, dressing.  She has also made some progress with her sitting balance and ability to rise to stand partially.  The greatest challenge is her ability to rise to stand fully and stay upright. Please see today's daily note for more detail.    Patient continues to demonstrate the following deficits: muscle weakness, decreased cardiorespiratoy endurance, abnormal tone, unbalanced muscle activation and motor apraxia, decreased attention to right and ideational apraxia, decreased attention, decreased awareness, decreased problem solving, decreased safety awareness, decreased memory and delayed processing and decreased sitting balance, decreased standing balance, decreased postural control, hemiplegia and decreased balance strategies and therefore will continue to benefit from skilled OT intervention to enhance overall performance with BADL and Reduce care partner burden.  Patient progressing toward long term goals..  Continue plan of care.  OT Short Term Goals Week 1:  OT Short Term Goal 1 (Week 1): Pt will demonstrate improve R hand coordination to be able to bring a spoon to her mouth with 50% accuracy. OT Short Term Goal 1 - Progress (Week 1): Met OT Short Term Goal 2 (Week 1): Pt will demonstrate improved coordination and praxis to wash UB with min A. OT Short Term Goal 2 - Progress (Week 1): Met OT Short Term Goal 3 (Week 1): Pt will demonstrate improved coordination and praxis to don a shirt with max A. OT  Short Term Goal 3 - Progress (Week 1): Met OT Short Term Goal 4 (Week 1): pt will be able to sit at EOB with close S for 5 minutes to prepare for a transfer. OT Short Term Goal 4 - Progress (Week 1): Met OT Short Term Goal 5 (Week 1): Pt will be able to rise to stand in a stedy lift with max A of 1 to demonstrate improved attention. OT Short Term Goal 5 - Progress (Week 1): Met Week 2:  OT Short Term Goal 1 (Week 2): Pt will be able to rise to stand with LRAD with mod A of 1. OT Short Term Goal 2 (Week 2): Pt will complete stand pivot to toilet with mod-max A of 1. OT Short Term Goal 3 (Week 2): Pt will be able to stand at toilet with grab bar with mod A of 1 while 2nd helper A with clothing management and clean up.  Skilled Therapeutic Interventions/Progress Updates:    See ADL documentation below.  Pt seen this session to address different approaches to her transfers as using the stedy, RW, squat pivots, stand pivots are all extremely challenging as pt has B extensor tone (or protective response) by pushing her legs and feet forward as soon as she rises to stand.  Once up, she presses back through her hips.   Another OT present to be +2 and problem solve strategies.    Pt received in bed and was dry but stated she need to get to the toilet urgently.  After trying squat pivots and it not being safe, used the 3 muskateer technique to have pt rise to stand and  pivot to w/c and then to toilet.  Pt requiring significant cues to lift her head and bring her hips forward.  But she was able to step towards the toilet.   On toilet, pt voided B and B.  Total A with hygiene and clothing.   To facilitate upright posture, +2 3 muskateer technique to have pt walk down the hallway. Initially pt struggling, but once her feet got into a stepping pattern she became more upright and was able to walk over 100 ft to 2nd nursing station.  Pt stood at sink with max A to wash hands and then walked another 75 feet to rest  in an arm chair. Stand pivot to w/c with 3 musk and pt then self propelled in wc with min A using her feet.   Pt set up with breakfast tray and worked on self feeding after set up.  No A needed with utensils or cup which demonstrates a good improvement in her motor planning.     Pt resting in wc with belt alarm on and all needs met.   Therapy Documentation Precautions:  Precautions Precautions: Fall Precaution Comments: truncal and bil UE ataxia, severely impaired motor planning Restrictions Weight Bearing Restrictions: No  Pain: Pain Assessment Pain Scale: 0-10 Pain Score: 3  Pain Type: Chronic pain Pain Location: Toe (Comment which one) (great, 2nd) Pain Orientation: Right Pain Descriptors / Indicators: Aching Pain Onset: On-going Pain Intervention(s): Medication (See eMAR) ADL: ADL Eating: Set up Where Assessed-Eating: Wheelchair Grooming: Minimal cueing, Minimal assistance Where Assessed-Grooming: Sitting at sink Upper Body Bathing: Minimal assistance Where Assessed-Upper Body Bathing: Sitting at sink Lower Body Bathing: Maximal assistance Where Assessed-Lower Body Bathing: Wheelchair Upper Body Dressing: Moderate assistance Where Assessed-Upper Body Dressing: Wheelchair Lower Body Dressing: Maximal assistance Where Assessed-Lower Body Dressing: Wheelchair Toileting: Dependent, Other (Comment) (+2 A) Where Assessed-Toileting: Glass blower/designer: Maximal verbal cueing, Maximal assistance, Other (comment) (+2) Toilet Transfer Method: Stand pivot   Therapy/Group: Individual Therapy  [place] 08/09/2020, 12:36 PM

## 2020-08-10 ENCOUNTER — Inpatient Hospital Stay (HOSPITAL_COMMUNITY): Payer: Medicare HMO

## 2020-08-10 ENCOUNTER — Inpatient Hospital Stay (HOSPITAL_COMMUNITY): Payer: Medicare HMO | Admitting: Physical Therapy

## 2020-08-10 LAB — GLUCOSE, CAPILLARY
Glucose-Capillary: 116 mg/dL — ABNORMAL HIGH (ref 70–99)
Glucose-Capillary: 114 mg/dL — ABNORMAL HIGH (ref 70–99)
Glucose-Capillary: 91 mg/dL (ref 70–99)
Glucose-Capillary: 115 mg/dL — ABNORMAL HIGH (ref 70–99)

## 2020-08-10 NOTE — Progress Notes (Signed)
[place] PHYSICAL MEDICINE & REHABILITATION PROGRESS NOTE   Subjective/Complaints:   Pt reports R foot/R big toe hurt- doesn't know how long has had it.   Doesn't remember when LBM was, but denies constipation.    ROS:  Pt denies SOB, abd pain, CP, N/V/C/D, and vision changes    Objective:   No results found. No results for input(s): WBC, HGB, HCT, PLT in the last 72 hours. No results for input(s): NA, K, CL, CO2, GLUCOSE, BUN, CREATININE, CALCIUM in the last 72 hours.  Intake/Output Summary (Last 24 hours) at 08/10/2020 1556 Last data filed at 08/10/2020 1300 Gross per 24 hour  Intake 840 ml  Output 100 ml  Net 740 ml     Physical Exam: Vital Signs Blood pressure 110/70, pulse 60, temperature 97.9 F (36.6 C), resp. rate 18, height 5\' 8"  (1.727 m), weight 52.2 kg, SpO2 100 %. General: Pt alert, appropriate, but poor memory, laying in bed, watching Tv, NAD HEENT: conjugate gaze,  Neck: Supple without JVD or lymphadenopathy Heart: borderline bradycardia- regular rhythm Chest: CTA B/L- no W/R/R- good air movement Abdomen: Soft, NT, ND, (+)BS . Extremities: No clubbing, cyanosis, or edema. Pulses are 2+ Skin: Clean and intact without signs of breakdown Neuro: She does have some difficulty naming objects with decrease in recall.   Pt also was unable to remember when had LBM, and what she's here for.  Follows simple commands. Motor: RUE: 3/5 proximal distally LUE: 4+/5 proximal distal Bilateral lower extremities: 4+/5 distal  Psychiatric:        Speech: Speech is tangential.        Cognition and Memory: Cognition is impaired. Memory is impaired. No change     Assessment/Plan: 1. Functional deficits secondary to right sided cerebral infarction which require 3+ hours per day of interdisciplinary therapy in a comprehensive inpatient rehab setting.  Physiatrist is providing close team supervision and 24 hour management of active medical problems listed  below.  Physiatrist and rehab team continue to assess barriers to discharge/monitor patient progress toward functional and medical goals  Care Tool:  Bathing    Body parts bathed by patient: Right arm, Left arm, Chest, Abdomen, Left lower leg, Right lower leg, Face, Right upper leg, Left upper leg   Body parts bathed by helper: Right arm, Left arm, Front perineal area, Buttocks, Right upper leg, Left upper leg, Right lower leg, Left lower leg Body parts n/a: Buttocks, Front perineal area   Bathing assist Assist Level: Supervision/Verbal cueing     Upper Body Dressing/Undressing Upper body dressing   What is the patient wearing?: Pull over shirt    Upper body assist Assist Level: Moderate Assistance - Patient 50 - 74%    Lower Body Dressing/Undressing Lower body dressing      What is the patient wearing?: Pants, Underwear/pull up     Lower body assist Assist for lower body dressing: Maximal Assistance - Patient 25 - 49%     Toileting Toileting    Toileting assist Assist for toileting: 2 Helpers     Transfers Chair/bed transfer  Transfers assist     Chair/bed transfer assist level: Maximal Assistance - Patient 25 - 49%     Locomotion Ambulation   Ambulation assist   Ambulation activity did not occur: Safety/medical concerns  Assist level: Minimal Assistance - Patient > 75% Assistive device: Walker-rolling Max distance: 35   Walk 10 feet activity   Assist  Walk 10 feet activity did not occur: Safety/medical concerns  Assist level: Minimal Assistance - Patient > 75% Assistive device: Walker-rolling   Walk 50 feet activity   Assist Walk 50 feet with 2 turns activity did not occur: Safety/medical concerns         Walk 150 feet activity   Assist Walk 150 feet activity did not occur: Safety/medical concerns         Walk 10 feet on uneven surface  activity   Assist Walk 10 feet on uneven surfaces activity did not occur: Safety/medical  concerns         Wheelchair     Assist Will patient use wheelchair at discharge?: Yes (Per PT long-term goals ) Type of Wheelchair: Manual    Wheelchair assist level: Dependent - Patient 0% Max wheelchair distance: 150    Wheelchair 50 feet with 2 turns activity    Assist        Assist Level: Dependent - Patient 0%   Wheelchair 150 feet activity     Assist      Assist Level: Dependent - Patient 0%   Blood pressure 110/70, pulse 60, temperature 97.9 F (36.6 C), resp. rate 18, height 5\' 8"  (1.727 m), weight 52.2 kg, SpO2 100 %.  Medical Problem List and Plan: 1.  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  Antithrombotics: -DVT/anticoagulation: Intravenous heparin transitioned back to Eliquis             -antiplatelet therapy: N/A 3. Pain Management: Neurontin 300 mg twice daily  8/28: Denies pain. Decrease Gabapentin to just HS dose.   MSK CP sportscreme  8/31: reports increased right great toe pain. Increase Gabapentin back to 300mg  BID  9/3: stable  9/4- pt reports still having pain in R great toe- don't want to give sedating meds- con't regimen 4. Mood: Provide emotional support             -antipsychotic agents: N/A 5. Neuropsych: This patient is not capable of making decisions on her own behalf. 6. Skin/Wound Care: Routine skin checks 7. Fluids/Electrolytes/Nutrition: Routine in and outs.  CMP ordered. 8.  Atrial fibrillation.  Amiodarone 200 mg twice daily, Toprol-XL 25 mg daily. Will need outpatient f/u with cardiology. Reviewed their 8/27 note and says to maintain amiodarone. Excellent control             Monitor with increased exertion. 9.   CAD/non-STEMI.  Follow-up cardiology services 10.  Type 2 diabetes mellitus.  Hemoglobin A1c 6.2.  Patient on Glucophage 500 mg twice daily prior to admission.  Resume as needed  8/28: will not restart metformin yet given AKI  8/29: poorly controlled. Changed diet to carb modified.   9/3: relatively well controlled  9/4- great control- 100-120 in last 24 hours- cont' regimen             Monitor with increased mobility. 11.  Acute on chronic systolic congestive heart failure.  Lasix 40 mg daily.  Monitor for any signs of fluid overload             Daily weights. Filed Weights   08/06/20 0500  08/07/20 0446 08/09/20 0505  Weight: 52.2 kg 52.8 kg 52.2 kg   9/4- Weights stable- con't regimen  12.  History of CEA as well as left carotid stenosis.  Follow-up vascular surgery 13.  Hyperlipidemia: Lipitor 14.  AKI.              Creatinine improving. 1.19 on 8/30  9/4- ordered labs for q monday    LOS: 8 days A FACE TO FACE EVALUATION WAS PERFORMED  Kadesia Robel 08/10/2020, 3:56 PM

## 2020-08-10 NOTE — Plan of Care (Signed)
  Problem: Consults Goal: RH STROKE PATIENT EDUCATION Description: See Patient Education module for education specifics  Outcome: Progressing Goal: Diabetes Guidelines if Diabetic/Glucose > 140 Description: If diabetic or lab glucose is > 140 mg/dl - Initiate Diabetes/Hyperglycemia Guidelines & Document Interventions  Outcome: Progressing   Problem: RH BLADDER ELIMINATION Goal: RH STG MANAGE BLADDER WITH ASSISTANCE Description: STG Manage Bladder With min Assistance Outcome: Progressing   Problem: RH SKIN INTEGRITY Goal: RH STG ABLE TO PERFORM INCISION/WOUND CARE W/ASSISTANCE Description: STG Able To Perform Incision/Wound Care With mod Assistance. Outcome: Progressing   Problem: RH SAFETY Goal: RH STG ADHERE TO SAFETY PRECAUTIONS W/ASSISTANCE/DEVICE Description: STG Adhere to Safety Precautions With supervision Assistance/Device. Outcome: Progressing   Problem: RH COGNITION-NURSING Goal: RH STG ANTICIPATES NEEDS/CALLS FOR ASSIST W/ASSIST/CUES Description: STG Anticipates Needs/Calls for Assist With supervision Assistance/Cues. Outcome: Progressing   Problem: RH KNOWLEDGE DEFICIT Goal: RH STG INCREASE KNOWLEDGE OF DIABETES Description: Pt/daughter will be able to demonstrate understanding of DM management with diet control and medication compliance using handouts/booklets with supervision assist.  Outcome: Progressing Goal: RH STG INCREASE KNOWLEDGE OF HYPERTENSION Description: Pt/daughter will be able to demonstrate understanding of HTN management with diet control and medication compliance using handouts/booklets with supervision assist.  Outcome: Progressing Goal: RH STG INCREASE KNOWLEGDE OF HYPERLIPIDEMIA Description: Pt/daughter will be able to demonstrate understanding of HLD management with diet control and medication compliance using handouts/booklets with supervision assist.  Outcome: Progressing Goal: RH STG INCREASE KNOWLEDGE OF STROKE PROPHYLAXIS Description:  Pt/daughter will be able to demonstrate understanding of stroke prevention with diet control and medication compliance using handouts/booklets with supervision assist.  Outcome: Progressing

## 2020-08-10 NOTE — Progress Notes (Signed)
Occupational Therapy Session Note  Patient Details  Name: Shawna Curry MRN: [id number] Date of Birth: 03/21/1933  Today's Date: 08/10/2020 OT Individual Time: 1030-1100 OT Individual Time Calculation (min): 30 min    Short Term Goals: Week 1:  OT Short Term Goal 1 (Week 1): Pt will demonstrate improve R hand coordination to be able to bring a spoon to her mouth with 50% accuracy. OT Short Term Goal 1 - Progress (Week 1): Met OT Short Term Goal 2 (Week 1): Pt will demonstrate improved coordination and praxis to wash UB with min A. OT Short Term Goal 2 - Progress (Week 1): Met OT Short Term Goal 3 (Week 1): Pt will demonstrate improved coordination and praxis to don a shirt with max A. OT Short Term Goal 3 - Progress (Week 1): Met OT Short Term Goal 4 (Week 1): pt will be able to sit at EOB with close S for 5 minutes to prepare for a transfer. OT Short Term Goal 4 - Progress (Week 1): Met OT Short Term Goal 5 (Week 1): Pt will be able to rise to stand in a stedy lift with max A of 1 to demonstrate improved attention. OT Short Term Goal 5 - Progress (Week 1): Met  Skilled Therapeutic Interventions/Progress Updates:    OT session focused on sit<>stand and functional transfers. Pt received supine in bed agreeable to transition to sitting in w/c. Pt required mod assist supine>sitting EOB. Donned pants with total A focusing on pt completing sit<>stand with EOB slightly elevated, however completing with mod A. While in standing, completing small marching with RW as OT provided multi-modal cues for upright posture and midline orientation. Following rest break, completed stand pivot transfer bed>w/c with mod A to stand and min A for pivoting. Pt left sitting in w/c with all needs in reach and son present.   Therapy Documentation Precautions:  Precautions Precautions: Fall Precaution Comments: truncal and bil UE ataxia, severely impaired motor planning Restrictions Weight Bearing Restrictions:  No General:   Vital Signs:   Pain:   ADL: ADL Eating: Set up Where Assessed-Eating: Wheelchair Grooming: Minimal cueing, Minimal assistance Where Assessed-Grooming: Sitting at sink Upper Body Bathing: Minimal assistance Where Assessed-Upper Body Bathing: Sitting at sink Lower Body Bathing: Maximal assistance Where Assessed-Lower Body Bathing: Wheelchair Upper Body Dressing: Moderate assistance Where Assessed-Upper Body Dressing: Wheelchair Lower Body Dressing: Maximal assistance Where Assessed-Lower Body Dressing: Wheelchair Toileting: Dependent, Other (Comment) (+2 A) Where Assessed-Toileting: Toilet Toilet Transfer: Maximal verbal cueing, Maximal assistance, Other (comment) (+2) Toilet Transfer Method: Stand pivot Vision   Perception    Praxis   Exercises:   Other Treatments:     Therapy/Group: Individual Therapy  [place], Kayla N 08/10/2020, 12:50 PM  

## 2020-08-10 NOTE — Progress Notes (Signed)
Physical Therapy Session Note  Patient Details  Name: Shawna Curry MRN: [id number] Date of Birth: [birth year]  Today's Date: 08/10/2020 PT Individual Time: 8546-2703 PT Individual Time Calculation (min): 80 min   Short Term Goals: Week 1:  PT Short Term Goal 1 (Week 1): pt will sit EOB with min assist up to 5 minutes PT Short Term Goal 2 (Week 1): Pt will trasnfer to and from [facility] with max assist of 1 PT Short Term Goal 3 (Week 1): Pt will perform bed mobility with max assist PT Short Term Goal 4 (Week 1): Pt will initated self propelled WC mobility  Skilled Therapeutic Interventions/Progress Updates:    Pt received supine in bed with her son present and pt agreeable to therapy session. Pt noted to have been incontinent of bladder and soiled w/c cushion therefore therapist exchanged it for a Roho cushion to allow improved pressure relief for increased, upright OOB activity tolerance.  Supine>sitting R EOB, HOB partially elevated and using bedrail, with CGA. Sitting EOB using UE support as needed for trunk control with supervision, donned sandals max assist. Attempted multiple trials of sit>stand elevated EOB to B HHA or to B UE support on chair with +2 max/total assist and pt demonstrating very strong posterior lean, hips shifted backwards, with feet sliding forward on floor and pt unable to bring weight forward or perform trunk/hip extension to come to stand, when attempting to provide manual facilitation for upright posture and anterior weight shift pt pushing backwards more strongly causing her feet to slide out from under her further making the attempts unsafe. Attempted sit>stand elevated EOB to RW with +2 max assist and after 2 attempts pt finally able to come to standing though continues to demo posterior lean with feet sliding forward (therapist blocking) - once in standing pt demos pushing to the R. R stand pivot to [facility] using RW with +2 max assist for balance and sequential verbal cuing for  sequencing of stepping and assist for AD management - had to sit on BSC prior to returning to stand to complete LB clothing management due to pt's posterior lean becoming worse and her feet starting to slide forward more making it unsafe to remain standing. Standing with +2 max assist while performing total assist LB clothing management. Continent of bladder and bowels. Transitioned to using stedy to increase pt safety while performing peri-care. Initially upon attempting to come to stand in stedy pt pushing backwards worse on stedy bar with pt pushing hips backwards - requiring increased verbal cuing and a 2nd attempt prior to being able to stand with total assist while +2 performed total assist peri-care and LB clothing management. In stedy at sink for mirror feedback performed sit<>stands x2 from stedy seat with verbal and visual cuing for anterior pelvic shift with increased hip extension and L lateral trunk lean (due to pushing to R) with pt progressing to mod assist of 1 for this task. Transported to therapy gym in w/c. Sit<>stands w/c<>RW with mirror feedback - cuing to place L hand on RW and push up with R hand from w/c arm rest to decrease pushing with L arm causing R trunk lean - +2 mod assist for lifting/balance and blocking bilateral feet from sliding forward on ground - therapist allowing pt time to initiate the movement to decrease pt's posterior pushing. Standing with RW and mirror feedback , max progressed to mod assist of 1, during L lateral reaching task to promote L weight shift and trunk lean - therapist  blocking pt from L lateral stepping to avoid increased pushing. Gait training ~77ft using RW with mod assist of 1 and +2 w/c follow - demos R LE internally rotated, R lateral trunk lean, forward trunk flexion, decreased B LE step lengths and decreased gait speed - pt demoing improved ability to ambulate as opposed to performing sit<>stand transfers or static standing with decreased pushing.  Transported back to room in w/c and pt left seated with needs in reach, seat belt alarm on, and RN present.   Need to ask family to bring in pt sneakers for increased safety with mobility - will ask primary PT to follow-up with SW to arrange this.  Therapy Documentation Precautions:  Precautions Precautions: Fall Precaution Comments: truncal and bil UE ataxia, severely impaired motor planning Restrictions Weight Bearing Restrictions: No  Pain:   Reports R toe pain - avoided touching the area to avoid increased pain.   Therapy/Group: Individual Therapy  Tawana Scale , PT, DPT, CSRS  08/10/2020, 12:50 PM

## 2020-08-11 LAB — GLUCOSE, CAPILLARY
Glucose-Capillary: 81 mg/dL (ref 70–99)
Glucose-Capillary: 77 mg/dL (ref 70–99)
Glucose-Capillary: 191 mg/dL — ABNORMAL HIGH (ref 70–99)
Glucose-Capillary: 79 mg/dL (ref 70–99)

## 2020-08-11 NOTE — Progress Notes (Signed)
[facility] PHYSICAL MEDICINE & REHABILITATION PROGRESS NOTE   Subjective/Complaints:   Pt reports R foot/toe-great toe still bothering her- tylenol helps some.      ROS:   Pt denies SOB, abd pain, CP, N/V/C/D, and vision changes    Objective:   No results found. No results for input(s): WBC, HGB, HCT, PLT in the last 72 hours. No results for input(s): NA, K, CL, CO2, GLUCOSE, BUN, CREATININE, CALCIUM in the last 72 hours. No intake or output data in the 24 hours ending 08/11/20 1939   Physical Exam: Vital Signs Blood pressure 113/60, pulse 67, temperature 97.7 F (36.5 C), resp. rate 16, height 5\' 8"  (1.727 m), weight 53.2 kg, SpO2 99 %. General: Pt alert, appropriate, still poor memory, NAD HEENT: conjugate gaze,  Neck: Supple without JVD or lymphadenopathy Heart: RRR Chest: CTA B/L- no W/R/R- good air movement Abdomen: Soft, NT, ND, (+)BS  Extremities: No clubbing, cyanosis, or edema. Pulses are 2+ Skin: Clean and intact without signs of breakdown Neuro: She does have some difficulty naming objects with decrease in recall.   Pt also was unable to remember when had LBM, and what she's here for.  Follows simple commands 75-90% of time Motor: RUE: 3/5 proximal distally LUE: 4+/5 proximal distal Bilateral lower extremities: 4+/5 distal  Psychiatric:       memory and cognition impaired     Assessment/Plan: 1. Functional deficits secondary to right sided cerebral infarction which require 3+ hours per day of interdisciplinary therapy in a comprehensive inpatient rehab setting.  Physiatrist is providing close team supervision and 24 hour management of active medical problems listed below.  Physiatrist and rehab team continue to assess barriers to discharge/monitor patient progress toward functional and medical goals  Care Tool:  Bathing    Body parts bathed by patient: Right arm, Left arm, Chest, Abdomen, Left lower leg, Right lower leg, Face, Right upper leg,  Left upper leg   Body parts bathed by helper: Right arm, Left arm, Front perineal area, Buttocks, Right upper leg, Left upper leg, Right lower leg, Left lower leg Body parts n/a: Buttocks, Front perineal area   Bathing assist Assist Level: Supervision/Verbal cueing     Upper Body Dressing/Undressing Upper body dressing   What is the patient wearing?: Pull over shirt    Upper body assist Assist Level: Moderate Assistance - Patient 50 - 74%    Lower Body Dressing/Undressing Lower body dressing      What is the patient wearing?: Pants, Underwear/pull up     Lower body assist Assist for lower body dressing: Maximal Assistance - Patient 25 - 49%     Toileting Toileting    Toileting assist Assist for toileting: 2 Helpers     Transfers Chair/bed transfer  Transfers assist     Chair/bed transfer assist level: 2 Helpers (stand pivot w/ RW)     Locomotion Ambulation   Ambulation assist   Ambulation activity did not occur: Safety/medical concerns  Assist level: 2 helpers (mod A of 1 w/ +2 w/c follow) Assistive device: Walker-rolling Max distance: 78ft   Walk 10 feet activity   Assist  Walk 10 feet activity did not occur: Safety/medical concerns  Assist level: 2 helpers (mod A of 1 w/ +2 w/c follow) Assistive device: Walker-rolling   Walk 50 feet activity   Assist Walk 50 feet with 2 turns activity did not occur: Safety/medical concerns         Walk 150 feet activity   Assist Walk  150 feet activity did not occur: Safety/medical concerns         Walk 10 feet on uneven surface  activity   Assist Walk 10 feet on uneven surfaces activity did not occur: Safety/medical concerns         Wheelchair     Assist Will patient use wheelchair at discharge?: Yes (Per PT long-term goals ) Type of Wheelchair: Manual    Wheelchair assist level: Dependent - Patient 0% Max wheelchair distance: 150    Wheelchair 50 feet with 2 turns  activity    Assist        Assist Level: Dependent - Patient 0%   Wheelchair 150 feet activity     Assist      Assist Level: Dependent - Patient 0%   Blood pressure 113/60, pulse 67, temperature 97.7 F (36.5 C), resp. rate 16, height 5\' 8"  (1.727 m), weight 53.2 kg, SpO2 99 %.  Medical Problem List and Plan: 1.  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  Antithrombotics: -DVT/anticoagulation: Intravenous heparin transitioned back to Eliquis             -antiplatelet therapy: N/A 3. Pain Management: Neurontin 300 mg twice daily  8/28: Denies pain. Decrease Gabapentin to just HS dose.   MSK CP sportscreme  8/31: reports increased right great toe pain. Increase Gabapentin back to 300mg  BID  9/3: stable  9/4- pt reports still having pain in R great toe- don't want to give sedating meds- con't regimen  9/5- pain OK- not great control- but don't want to confuse/sedate pt- will continue 4. Mood: Provide emotional support             -antipsychotic agents: N/A 5. Neuropsych: This patient is not capable of making decisions on her own behalf. 6. Skin/Wound Care: Routine skin checks 7. Fluids/Electrolytes/Nutrition: Routine in and outs.  CMP ordered. 8.  Atrial fibrillation.  Amiodarone 200 mg twice daily, Toprol-XL 25 mg daily. Will need outpatient f/u with cardiology. Reviewed their 8/27 note and says to maintain amiodarone. Excellent control             Monitor with increased exertion. 9.  CAD/non-STEMI.  Follow-up cardiology services 10.  Type 2 diabetes mellitus.  Hemoglobin A1c 6.2.  Patient on Glucophage 500 mg twice daily prior to admission.  Resume as needed  8/28: will not  restart metformin yet given AKI  8/29: poorly controlled. Changed diet to carb modified.   9/3: relatively well controlled  9/4- great control- 100-120 in last 24 hours- cont' regimen  9/5- BGs 77-191- only 1 above 115- good regimen- will continue             Monitor with increased mobility. 11.  Acute on chronic systolic congestive heart failure.  Lasix 40 mg daily.  Monitor for any signs of fluid overload             Daily weights. Filed Weights   08/07/20 0446 08/09/20 0505 08/11/20 0615  Weight: 52.8 kg 52.2 kg 53.2 kg   9/4- Weights stable- con't regimen  12.  History of CEA as well as left carotid stenosis.  Follow-up vascular surgery 13.  Hyperlipidemia: Lipitor 14.  AKI.              Creatinine improving. 1.19 on 8/30  9/4- ordered labs for q monday    LOS: 9 days A FACE TO FACE EVALUATION WAS PERFORMED  Granger Chui 08/11/2020, 7:39 PM

## 2020-08-11 NOTE — Plan of Care (Signed)
  Problem: Consults Goal: RH STROKE PATIENT EDUCATION Description: See Patient Education module for education specifics  Outcome: Progressing Goal: Diabetes Guidelines if Diabetic/Glucose > 140 Description: If diabetic or lab glucose is > 140 mg/dl - Initiate Diabetes/Hyperglycemia Guidelines & Document Interventions  Outcome: Progressing   Problem: RH BLADDER ELIMINATION Goal: RH STG MANAGE BLADDER WITH ASSISTANCE Description: STG Manage Bladder With min Assistance Outcome: Progressing   Problem: RH SKIN INTEGRITY Goal: RH STG ABLE TO PERFORM INCISION/WOUND CARE W/ASSISTANCE Description: STG Able To Perform Incision/Wound Care With mod Assistance. Outcome: Progressing   Problem: RH SAFETY Goal: RH STG ADHERE TO SAFETY PRECAUTIONS W/ASSISTANCE/DEVICE Description: STG Adhere to Safety Precautions With supervision Assistance/Device. Outcome: Progressing   Problem: RH COGNITION-NURSING Goal: RH STG ANTICIPATES NEEDS/CALLS FOR ASSIST W/ASSIST/CUES Description: STG Anticipates Needs/Calls for Assist With supervision Assistance/Cues. Outcome: Progressing   Problem: RH KNOWLEDGE DEFICIT Goal: RH STG INCREASE KNOWLEDGE OF DIABETES Description: Pt/daughter will be able to demonstrate understanding of DM management with diet control and medication compliance using handouts/booklets with supervision assist.  Outcome: Progressing Goal: RH STG INCREASE KNOWLEDGE OF HYPERTENSION Description: Pt/daughter will be able to demonstrate understanding of HTN management with diet control and medication compliance using handouts/booklets with supervision assist.  Outcome: Progressing Goal: RH STG INCREASE KNOWLEGDE OF HYPERLIPIDEMIA Description: Pt/daughter will be able to demonstrate understanding of HLD management with diet control and medication compliance using handouts/booklets with supervision assist.  Outcome: Progressing Goal: RH STG INCREASE KNOWLEDGE OF STROKE PROPHYLAXIS Description:  Pt/daughter will be able to demonstrate understanding of stroke prevention with diet control and medication compliance using handouts/booklets with supervision assist.  Outcome: Progressing

## 2020-08-12 ENCOUNTER — Inpatient Hospital Stay (HOSPITAL_COMMUNITY): Payer: Medicare HMO | Admitting: Occupational Therapy

## 2020-08-12 ENCOUNTER — Inpatient Hospital Stay (HOSPITAL_COMMUNITY): Payer: Medicare HMO

## 2020-08-12 LAB — BASIC METABOLIC PANEL
Anion gap: 11 (ref 5–15)
BUN: 28 mg/dL — ABNORMAL HIGH (ref 8–23)
CO2: 25 mmol/L (ref 22–32)
Calcium: 9 mg/dL (ref 8.9–10.3)
Chloride: 102 mmol/L (ref 98–111)
Creatinine, Ser: 1.36 mg/dL — ABNORMAL HIGH (ref 0.44–1.00)
GFR calc Af Amer: 41 mL/min — ABNORMAL LOW (ref >60)
GFR calc non Af Amer: 35 mL/min — ABNORMAL LOW (ref >60)
Glucose, Bld: 101 mg/dL — ABNORMAL HIGH (ref 70–99)
Potassium: 4.7 mmol/L (ref 3.5–5.1)
Sodium: 138 mmol/L (ref 135–145)

## 2020-08-12 LAB — CBC WITH DIFFERENTIAL/PLATELET
Abs Immature Granulocytes: 0.03 10*3/uL (ref 0.00–0.07)
Basophils Absolute: 0 10*3/uL (ref 0.0–0.1)
Basophils Relative: 0 %
Eosinophils Absolute: 0.1 10*3/uL (ref 0.0–0.5)
Eosinophils Relative: 1 %
HCT: 32.2 % — ABNORMAL LOW (ref 36.0–46.0)
Hemoglobin: 10.3 g/dL — ABNORMAL LOW (ref 12.0–15.0)
Immature Granulocytes: 0 %
Lymphocytes Relative: 6 %
Lymphs Abs: 0.5 10*3/uL — ABNORMAL LOW (ref 0.7–4.0)
MCH: 28.1 pg (ref 26.0–34.0)
MCHC: 32 g/dL (ref 30.0–36.0)
MCV: 87.7 fL (ref 80.0–100.0)
Monocytes Absolute: 0.7 10*3/uL (ref 0.1–1.0)
Monocytes Relative: 8 %
Neutro Abs: 6.8 10*3/uL (ref 1.7–7.7)
Neutrophils Relative %: 85 %
Platelets: 207 10*3/uL (ref 150–400)
RBC: 3.67 MIL/uL — ABNORMAL LOW (ref 3.87–5.11)
RDW: 17.6 % — ABNORMAL HIGH (ref 11.5–15.5)
WBC: 8.1 10*3/uL (ref 4.0–10.5)
nRBC: 0 % (ref 0.0–0.2)

## 2020-08-12 LAB — GLUCOSE, CAPILLARY
Glucose-Capillary: 105 mg/dL — ABNORMAL HIGH (ref 70–99)
Glucose-Capillary: 86 mg/dL (ref 70–99)
Glucose-Capillary: 106 mg/dL — ABNORMAL HIGH (ref 70–99)
Glucose-Capillary: 190 mg/dL — ABNORMAL HIGH (ref 70–99)

## 2020-08-12 NOTE — Progress Notes (Addendum)
Occupational Therapy Session Note  Patient Details  Name: Shawna Curry MRN: [id number] Date of Birth: Oct 27, 1933  Today's Date: 08/12/2020 OT Individual Time: 1355-1455 OT Individual Time Calculation (min): 60 min    Short Term Goals: Week 2:  OT Short Term Goal 1 (Week 2): Pt will be able to rise to stand with LRAD with mod A of 1. OT Short Term Goal 2 (Week 2): Pt will complete stand pivot to toilet with mod-max A of 1. OT Short Term Goal 3 (Week 2): Pt will be able to stand at toilet with grab bar with mod A of 1 while 2nd helper A with clothing management and clean up.  Skilled Therapeutic Interventions/Progress Updates:    Pt supine in bed, c/o pain in right foot, agreeable to OT session.  Pts dtr present throughout session and reports nurse just administered pain medication.  Pt completed supine to sit with supervision.  Pt completed SPT EOB to w/c using RW with mod assist for balance and RW mgt.  Pt having difficulty sequencing foot placement for pivot.  Pt bathed UB sinkside with supervision needing intermittent VCs for problem solving and sequencing.  Pt bathed LB needing max VCs and mod assist to stand for pericare and buttocks. Pt needed min assist for UB dressing primarily due to tight fit and needing VCs to orient to top/bottom/front/back of shirt.  Pt dressed LB with max assist including sit<>stand.  Pt guarding RLE during standing at sinkside and reporting pain in foot.  Nurse made aware.  Call bell in reach, seat alarm on.  Therapy Documentation Precautions:  Precautions Precautions: Fall Precaution Comments: truncal and bil UE ataxia, severely impaired motor planning Restrictions Weight Bearing Restrictions: No   Therapy/Group: Individual Therapy  Ezekiel Slocumb 08/12/2020, 4:16 PM

## 2020-08-12 NOTE — Progress Notes (Signed)
Speech Language Pathology Daily Session Note  Patient Details  Name: Shawna Curry MRN: [id number] Date of Birth: 29-Mar-1933  Today's Date: 08/12/2020 SLP Individual Time: 1000-1056 SLP Individual Time Calculation (min): 56 min  Short Term Goals: Week 2: SLP Short Term Goal 1 (Week 2): Patient will perform basic functional ADL tasks with min A and 80% accuracy. SLP Short Term Goal 2 (Week 2): Patient will orient to time/situation/place with min A for use of visual/written aids after set up assistance. SLP Short Term Goal 3 (Week 2): Patient will perform selective attention task for increments of 5 minutes with min-modA for redirection.  Skilled Therapeutic Interventions: Skilled SLP intervention focused on cognition. Max A with verbal cues and visual cues using calendar to orient to date. Pt able to recall location with specific name of hospital with min a and and situation with max A. Pt unable to recall home situation prior to hospitalization and initially stated "I live alone" then stated " I think my granddaughter and grandson live with me but I dont really know." Selective attention task completed with pt removing all cards containing a specific symbol. Mod A with visual and verbal cues to increase attention and error awareness. Pt removed 10/13 accurately. Functional selective attention task using lottery tickets completed with mod A visual cues for locating cards containing matching numbers. Pt left seated upright in bed with bed alarm on and needs within reach. Cont therapy per plan of care.      Pain Pain Assessment Pain Scale: Faces Pain Score: 2  Faces Pain Scale: No hurt Pain Type: Chronic pain Pain Location: Toe (Comment which one) (R great and 2nd) Pain Orientation: Anterior Pain Descriptors / Indicators: Tender Pain Frequency: Intermittent Pain Onset: On-going Patients Stated Pain Goal: 1 Pain Intervention(s): Medication (See eMAR)  Therapy/Group: Individual  Therapy  Darrol Poke Shoshannah Faubert 08/12/2020, 12:21 PM

## 2020-08-12 NOTE — Progress Notes (Signed)
[facility] PHYSICAL MEDICINE & REHABILITATION PROGRESS NOTE   Subjective/Complaints: No complaints this morning. When asked about right foot pain, she acknowledges some.  Nurses giving her meds in applesauce About to work with SLP  ROS:  Pt denies SOB, abd pain, CP, N/V/C/D, and vision changes  Objective:   No results found. Recent Labs    08/12/20 0635  WBC 8.1  HGB 10.3*  HCT 32.2*  PLT 207   Recent Labs    08/12/20 0635  NA 138  K 4.7  CL 102  CO2 25  GLUCOSE 101*  BUN 28*  CREATININE 1.36*  CALCIUM 9.0   No intake or output data in the 24 hours ending 08/12/20 1230   Physical Exam: Vital Signs Blood pressure 109/70, pulse 60, temperature 98.9 F (37.2 C), resp. rate 16, height 5\' 8"  (1.727 m), weight 52.4 kg, SpO2 100 %.  General: Alert, No apparent distress HEENT: Head is normocephalic, atraumatic, PERRLA, EOMI, sclera anicteric, oral mucosa pink and moist, dentition intact, ext ear canals clear,  Neck: Supple without JVD or lymphadenopathy Heart: Reg rate and rhythm. No murmurs rubs or gallops Chest: CTA bilaterally without wheezes, rales, or rhonchi; no distress Abdomen: Soft, non-tender, non-distended, bowel sounds positive. Extremities: No clubbing, cyanosis, or edema. Pulses are 2+ Skin: Clean and intact without signs of breakdown Neuro: She does have some difficulty naming objects with decrease in recall.   Pt also was unable to remember when had LBM, and what she's here for.  Follows simple commands 75-90% of time Motor: RUE: 3/5 proximal distally LUE: 4+/5 proximal distal Bilateral lower extremities: 4+/5 distal  Psychiatric:       memory and cognition impaired    Assessment/Plan: 1. Functional deficits secondary to right sided cerebral infarction which require 3+ hours per day of interdisciplinary therapy in a comprehensive inpatient rehab setting.  Physiatrist is providing close team supervision and 24 hour management of active medical  problems listed below.  Physiatrist and rehab team continue to assess barriers to discharge/monitor patient progress toward functional and medical goals  Care Tool:  Bathing    Body parts bathed by patient: Right arm, Left arm, Chest, Abdomen, Left lower leg, Right lower leg, Face, Right upper leg, Left upper leg   Body parts bathed by helper: Right arm, Left arm, Front perineal area, Buttocks, Right upper leg, Left upper leg, Right lower leg, Left lower leg Body parts n/a: Buttocks, Front perineal area   Bathing assist Assist Level: Supervision/Verbal cueing     Upper Body Dressing/Undressing Upper body dressing   What is the patient wearing?: Pull over shirt    Upper body assist Assist Level: Moderate Assistance - Patient 50 - 74%    Lower Body Dressing/Undressing Lower body dressing      What is the patient wearing?: Pants, Underwear/pull up     Lower body assist Assist for lower body dressing: Maximal Assistance - Patient 25 - 49%     Toileting Toileting    Toileting assist Assist for toileting: 2 Helpers     Transfers Chair/bed transfer  Transfers assist     Chair/bed transfer assist level: 2 Helpers (stand pivot w/ RW)     Locomotion Ambulation   Ambulation assist   Ambulation activity did not occur: Safety/medical concerns  Assist level: 2 helpers (mod A of 1 w/ +2 w/c follow) Assistive device: Walker-rolling Max distance: 36ft   Walk 10 feet activity   Assist  Walk 10 feet activity did not occur: Safety/medical  concerns  Assist level: 2 helpers (mod A of 1 w/ +2 w/c follow) Assistive device: Walker-rolling   Walk 50 feet activity   Assist Walk 50 feet with 2 turns activity did not occur: Safety/medical concerns         Walk 150 feet activity   Assist Walk 150 feet activity did not occur: Safety/medical concerns         Walk 10 feet on uneven surface  activity   Assist Walk 10 feet on uneven surfaces activity did not  occur: Safety/medical concerns         Wheelchair     Assist Will patient use wheelchair at discharge?: Yes (Per PT long-term goals ) Type of Wheelchair: Manual    Wheelchair assist level: Dependent - Patient 0% Max wheelchair distance: 150    Wheelchair 50 feet with 2 turns activity    Assist        Assist Level: Dependent - Patient 0%   Wheelchair 150 feet activity     Assist      Assist Level: Dependent - Patient 0%   Blood pressure 109/70, pulse 60, temperature 98.9 F (37.2 C), resp. rate 16, height 5\' 8"  (1.727 m), weight 52.4 kg, SpO2 100 %.  Medical Problem List and Plan: 1.  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  Antithrombotics: -DVT/anticoagulation: Intravenous heparin transitioned back to Eliquis             -antiplatelet therapy: N/A 3. Pain Management: Neurontin 300 mg twice daily and muscle rub crea. Has some right foot pain but overall well controlled.  4. Mood: Provide emotional support             -antipsychotic agents: N/A 5. Neuropsych: This patient is not capable of making decisions on her own behalf. 6. Skin/Wound Care: Routine skin checks 7. Fluids/Electrolytes/Nutrition: Routine in and outs.  CMP ordered. 8.  Atrial fibrillation.  Amiodarone 200 mg twice daily, Toprol-XL 25 mg daily. Will need outpatient f/u with cardiology. Reviewed their 8/27 note and says to maintain amiodarone. Excellent control.              Monitor with increased exertion. 9.  CAD/non-STEMI.  Follow-up cardiology services 10.  Type 2 diabetes mellitus.  Hemoglobin A1c 6.2.  Patient on Glucophage 500 mg twice daily prior to admission.  Resume as needed  9/6:  Usually well controlled.              Monitor with increased mobility. 11.  Acute on chronic systolic congestive heart failure.  Lasix 40 mg daily.  Monitor for any signs of fluid overload             Daily weights. Filed Weights   08/09/20 0505 08/11/20 0615 08/12/20 0643  Weight: 52.2 kg 53.2 kg 52.4 kg   9/4- Weights stable- con't regimen  12.  History of CEA as well as left carotid stenosis.  Follow-up vascular surgery 13.  Hyperlipidemia: Lipitor 14.  AKI.              Creatinine increased to 1.26  on 9/6. Placed nursing order to encourage hydration. Repeat tomorrow.     LOS: 10 days A FACE TO FACE EVALUATION WAS PERFORMED  Zamyah Wiesman P Shaquila Sigman 08/12/2020, 12:30 PM

## 2020-08-12 NOTE — Progress Notes (Signed)
Physical Therapy Weekly Progress Note  Patient Details  Name: Shawna Curry MRN: [id number] Date of Birth: January 28, 1933  Beginning of progress report period: August 03, 2020 End of progress report period: August 12, 2020  Today's Date: 08/12/2020 PT Individual Time: 1100-1158 PT Individual Time Calculation (min): 58 min   Patient has met 2 of 4 short term goals. Pt making slow progress towards goals. She fluctuates with performance but overall has been requiring maxA for bed mobility, maxA +1-2 for squat-pivot transfers, modA +1 for sit<>stand transfers at // bars, and has progress towards ambulating ~70f with min/modA of 1 and +2 w/c follow. Pt continues to be primarily limited by general deconditioning, ideomotor apraxia, decreased activity tolerance, significant posterior bias with most upright mobility, poor sitting and standing balance, and memory deficits.  Patient continues to demonstrate the following deficits muscle weakness, impaired timing and sequencing, unbalanced muscle activation, motor apraxia, decreased coordination and decreased motor planning, decreased motor planning and ideational apraxia and decreased initiation, decreased attention, decreased awareness, decreased problem solving, decreased safety awareness, decreased memory and delayed processing and therefore will continue to benefit from skilled PT intervention to increase functional independence with mobility.  Patient progressing toward long term goals..  Continue plan of care.  PT Short Term Goals Week 2:  PT Short Term Goal 1 (Week 2): Pt will maintain sitting EOB with no more than minA and no BUE support PT Short Term Goal 2 (Week 2): Pt will consistently perform bed<>chair transfers with modA and LRAD PT Short Term Goal 3 (Week 2): Pt will self propel manual w/c 763fwith minA PT Short Term Goal 4 (Week 2): Pt will ambulate 7593fith modA and LRAD  Skilled Therapeutic Interventions/Progress Updates:    Pt  received supine in bed, agreeable to PT session. Pt reports mild chest and R great toe pain intermittently throughout session. She is unable to describe the pain but appears mm in nature. She was unable to recall therapist's name or role of PT, however this is not new and has been consistent throughout her rehab stay. Donned her pants with maxA while supine in bed, performed rolling L<>R with modA for pulling her pants up. Performed R sidelying to sitting EOB with modA for BLE management and assist for trunk to upright. Donned shoes with maxA while seated EOB, encouraging patient forward weight shift and to lean forward to reach her feet to assist as much as possible. Performed squat<>pivot from EOB to w/c with maxA +2 , cues for head/hip and sequencing however limited carryover noted. Wheeled into bathroom where she performed stand<>pivot transfer with modA and with BUE's on bathroom grab bar, again promoting forward weight shift and VC for sequencing throughout. Pt unsuccessful in voiding, sit<>stand with modA back to her w/c with the same technique. Placed patient in the hallway while in her w/c and VC for w/c propulsion using her feet with emphasis on forward lean and increasing B heel strike to "pull" herself forward, she required maxA for propelling ~[id number] sit<>stand from w/c height to RW with mod/maxA +2, therapists anchoring RW and providing simple/driect cueing for technique/sequencing, such as "stand up." Once standing, cues for straightening knees and forward hips where she ambulated ~49ft31fh modA of 1 and w/c follow. After seated rest, placed targets on floor for her to put her feet on during sit<>stands to emphasize feet back and chest forward. Pt then ambulated an additional ~75ft 65f min/modA of 1 and RW to ADL room where she performed  sit<>supine with minA on regular flat bed. Transferred from bed to low sitting couch and she required mod/maxA of 1 to RW for sit<>stand. Pt transported to  car simulator where she ambulated to the car with minA and RW, performed car transfer with minA and RW, simple cueing providing for technique. Pt transported back to her room in her w/c with totalA for time management and ended session seated in w/c with belt alarm on, needs in reach.  Therapy Documentation Precautions:  Precautions Precautions: Fall Precaution Comments: truncal and bil UE ataxia, severely impaired motor planning Restrictions Weight Bearing Restrictions: No Pain: Pain Assessment Pain Scale: Faces Pain Score: 2  Faces Pain Scale: No hurt Pain Type: Chronic pain Pain Location: Toe (Comment which one) (R great and 2nd) Pain Orientation: Anterior Pain Descriptors / Indicators: Tender Pain Frequency: Intermittent Pain Onset: On-going Patients Stated Pain Goal: 1 Pain Intervention(s): Medication (See eMAR)   Therapy/Group: Individual Therapy  Oluwanifemi Susman P Kaylani Fromme PT 08/12/2020, 12:30 PM

## 2020-08-13 ENCOUNTER — Inpatient Hospital Stay (HOSPITAL_COMMUNITY): Payer: Medicare HMO

## 2020-08-13 ENCOUNTER — Inpatient Hospital Stay (HOSPITAL_COMMUNITY): Payer: Medicare HMO | Admitting: Occupational Therapy

## 2020-08-13 ENCOUNTER — Inpatient Hospital Stay (HOSPITAL_COMMUNITY): Payer: Medicare HMO | Admitting: Speech Pathology

## 2020-08-13 DIAGNOSIS — I69391 Dysphagia following cerebral infarction: Secondary | ICD-10-CM

## 2020-08-13 DIAGNOSIS — I5023 Acute on chronic systolic (congestive) heart failure: Secondary | ICD-10-CM

## 2020-08-13 DIAGNOSIS — N179 Acute kidney failure, unspecified: Secondary | ICD-10-CM

## 2020-08-13 DIAGNOSIS — E1165 Type 2 diabetes mellitus with hyperglycemia: Secondary | ICD-10-CM

## 2020-08-13 DIAGNOSIS — R7309 Other abnormal glucose: Secondary | ICD-10-CM

## 2020-08-13 DIAGNOSIS — D649 Anemia, unspecified: Secondary | ICD-10-CM

## 2020-08-13 LAB — BASIC METABOLIC PANEL
Anion gap: 10 (ref 5–15)
BUN: 30 mg/dL — ABNORMAL HIGH (ref 8–23)
CO2: 25 mmol/L (ref 22–32)
Calcium: 8.8 mg/dL — ABNORMAL LOW (ref 8.9–10.3)
Chloride: 103 mmol/L (ref 98–111)
Creatinine, Ser: 1.41 mg/dL — ABNORMAL HIGH (ref 0.44–1.00)
GFR calc Af Amer: 39 mL/min — ABNORMAL LOW (ref >60)
GFR calc non Af Amer: 34 mL/min — ABNORMAL LOW (ref >60)
Glucose, Bld: 151 mg/dL — ABNORMAL HIGH (ref 70–99)
Potassium: 4.5 mmol/L (ref 3.5–5.1)
Sodium: 138 mmol/L (ref 135–145)

## 2020-08-13 LAB — GLUCOSE, CAPILLARY
Glucose-Capillary: 98 mg/dL (ref 70–99)
Glucose-Capillary: 67 mg/dL — ABNORMAL LOW (ref 70–99)
Glucose-Capillary: 136 mg/dL — ABNORMAL HIGH (ref 70–99)
Glucose-Capillary: 105 mg/dL — ABNORMAL HIGH (ref 70–99)
Glucose-Capillary: 114 mg/dL — ABNORMAL HIGH (ref 70–99)

## 2020-08-13 NOTE — Progress Notes (Signed)
[place] PHYSICAL MEDICINE & REHABILITATION PROGRESS NOTE   Subjective/Complaints: Patient seen sitting up in bed this morning.  She states she slept well overnight.  No reported issues overnight.  ROS: Denies CP, SOB, N/V/D  Objective:   No results found. Recent Labs    08/12/20 0635  WBC 8.1  HGB 10.3*  HCT 32.2*  PLT 207   Recent Labs    08/12/20 0635 08/13/20 0816  NA 138 138  K 4.7 4.5  CL 102 103  CO2 25 25  GLUCOSE 101* 151*  BUN 28* 30*  CREATININE 1.36* 1.41*  CALCIUM 9.0 8.8*    Intake/Output Summary (Last 24 hours) at 08/13/2020 1237 Last data filed at 08/13/2020 1100 Gross per 24 hour  Intake 420 ml  Output --  Net 420 ml     Physical Exam: Vital Signs Blood pressure 135/66, pulse 62, temperature 98.3 F (36.8 C), resp. rate 16, height 5\' 8"  (1.727 m), weight 52.1 kg, SpO2 99 %. Constitutional: No distress . Vital signs reviewed. HENT: Normocephalic.  Atraumatic. Eyes: EOMI. No discharge. Cardiovascular: No JVD. Respiratory: Normal effort.  No stridor. GI: Non-distended. Skin: Warm and dry.  Intact. Psych: Pleasantly confused. Musc: No edema in extremities.  No tenderness in extremities. Neuro: Alert, but disoriented Motor: RUE: 2/5 shoulder abduction, 4-4+/5 distally  LUE: 4+/5 proximal distal Bilateral lower extremities: 4+/5 distal   Assessment/Plan: 1. Functional deficits secondary to right sided cerebral infarction which require 3+ hours per day of interdisciplinary therapy in a comprehensive inpatient rehab setting.  Physiatrist is providing close team supervision and 24 hour management of active medical problems listed below.  Physiatrist and rehab team continue to assess barriers to discharge/monitor patient progress toward functional and medical goals  Care Tool:  Bathing    Body parts bathed by patient: Right arm, Left arm, Chest, Abdomen, Face, Right upper leg, Left upper leg, Front perineal area   Body parts bathed by  helper: Right lower leg, Left lower leg, Buttocks Body parts n/a: Buttocks, Front perineal area   Bathing assist Assist Level: Moderate Assistance - Patient 50 - 74%     Upper Body Dressing/Undressing Upper body dressing   What is the patient wearing?: Pull over shirt    Upper body assist Assist Level: Minimal Assistance - Patient > 75%    Lower Body Dressing/Undressing Lower body dressing      What is the patient wearing?: Incontinence brief, Pants     Lower body assist Assist for lower body dressing: Maximal Assistance - Patient 25 - 49%     Toileting Toileting    Toileting assist Assist for toileting: Moderate Assistance - Patient 50 - 74%     Transfers Chair/bed transfer  Transfers assist     Chair/bed transfer assist level: Minimal Assistance - Patient > 75%     Locomotion Ambulation   Ambulation assist   Ambulation activity did not occur: Safety/medical concerns  Assist level: Minimal Assistance - Patient > 75% Assistive device: Walker-rolling Max distance: 129ft   Walk 10 feet activity   Assist  Walk 10 feet activity did not occur: Safety/medical concerns  Assist level: Minimal Assistance - Patient > 75% Assistive device: Walker-rolling   Walk 50 feet activity   Assist Walk 50 feet with 2 turns activity did not occur: Safety/medical concerns  Assist level: Minimal Assistance - Patient > 75% Assistive device: Walker-rolling    Walk 150 feet activity   Assist Walk 150 feet activity did not occur: Safety/medical concerns  Assist level: Minimal Assistance - Patient > 75% Assistive device: Walker-rolling    Walk 10 feet on uneven surface  activity   Assist Walk 10 feet on uneven surfaces activity did not occur: Safety/medical concerns   Assist level: Moderate Assistance - Patient - 50 - 74% Assistive device: Aeronautical engineer Will patient use wheelchair at discharge?: Yes Type of Wheelchair: Manual     Wheelchair assist level: Maximal Assistance - Patient 25 - 49% Max wheelchair distance: 50    Wheelchair 50 feet with 2 turns activity    Assist        Assist Level: Maximal Assistance - Patient 25 - 49%   Wheelchair 150 feet activity     Assist      Assist Level: Dependent - Patient 0%   Blood pressure 135/66, pulse 62, temperature 98.3 F (36.8 C), resp. rate 16, height 5\' 8"  (1.727 m), weight 52.1 kg, SpO2 99 %.  Medical Problem List and Plan: 1.  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  Antithrombotics: -DVT/anticoagulation: Intravenous heparin transitioned back to Eliquis             -antiplatelet therapy: N/A 3. Pain Management: Neurontin 300 mg twice daily and muscle rub crea. Has some right foot pain but overall well controlled.   Monitor with increased mobility 4. Mood: Provide emotional support             -antipsychotic agents: N/A 5. Neuropsych: This patient is not capable of making decisions on her own behalf. 6. Skin/Wound Care: Routine skin checks 7. Fluids/Electrolytes/Nutrition: Routine in and outs. 8.  Atrial fibrillation.  Amiodarone 200 mg twice daily, Toprol-XL 25 mg daily. Will need outpatient f/u with cardiology.              Monitor with increased exertion. 9.  CAD/non-STEMI.  Follow-up cardiology services 10.  Type 2 diabetes mellitus.  Hemoglobin A1c 6.2.  Patient on Glucophage 500 mg twice daily prior to admission.    Resumed            Labile on 9/7  Monitor with increased mobility. 11.  Acute on chronic systolic congestive heart failure.  Lasix 40 mg daily.  Monitor for any signs of fluid overload             Daily weights. Filed Weights   08/11/20 0615 08/12/20 0643 08/13/20 0500  Weight: 53.2 kg 52.4 kg 52.1 kg   Stable on  9/7 12.  History of CEA as well as left carotid stenosis.  Follow-up vascular surgery 13.  Hyperlipidemia: Lipitor 14.  AKI.              Creatinine 1.41 on 9/7  Encourage fluids 15.  Anemia  Hemoglobin 10.3 on 9/6  Continue to monitor 16.  Post stroke dysphagia  D3 thins, advance diet as tolerated  LOS: 11 days A FACE TO FACE EVALUATION WAS PERFORMED  Deniyah Dillavou Lorie Phenix 08/13/2020, 12:37 PM

## 2020-08-13 NOTE — Progress Notes (Signed)
Physical Therapy Session Note  Patient Details  Name: Shawna Curry MRN: [id number] Date of Birth: 1933-09-04  Today's Date: 08/13/2020 PT Individual Time: 1000-1053 PT Individual Time Calculation (min): 53 min   Short Term Goals: Week 2:  PT Short Term Goal 1 (Week 2): Pt will maintain sitting EOB with no more than minA and no BUE support PT Short Term Goal 2 (Week 2): Pt will consistently perform bed<>chair transfers with modA and LRAD PT Short Term Goal 3 (Week 2): Pt will self propel manual w/c 27ft with minA PT Short Term Goal 4 (Week 2): Pt will ambulate 21ft with modA and LRAD  Skilled Therapeutic Interventions/Progress Updates:    Pt received sitting in w/c, agreeable to PT session. Pt unable to recall writer's name or role. She reports mild R foot pain, unrated. Rest breaks and mobility provided for conservative pain management. When asked, she reports not needing to void or go to bathroom, however there was no documentation in chart regarding recent toileting. Therefore, wheeled her in her bathroom where she performed stand<>pivot transfer with min/modA with BUE's on grab bar; required totalA for lower body dressing while standing. She was successful in voiding and able to perform pericare with setupA. Performed stand<>pivot transfer back to her w/c with modA with BUE's on grab bar. Then, transported her in hallways to dayroom therapy gym with [place] for time management. Pt then ambulated ~172ft with mostly CGA and intermittent minA guard and RW around nurses station, VC for reducing RLE internal rotation, improving proximity to AD, upright posture. After seated rest, she then performed standing toe taps on 4inch block with RW support and CGA, 2x10 reps each leg, focusing on targeted steps and standing tolerance. Pt able to complete sit<>stands from w/c to RW with minA while providing ++ time for completion and simple direct cueing for BUE placement. After seated rest break, pt then  instructed to find x4 bowling pins (provided visual demonstration prior) that were placed around day room. Pt able to succesfuly locate 3/4 bowling pins however was able to only get to 2/4 of them 2/2 fatigue. When retrieving bowling pins, she ambulated with CGA/minA and RW, towards end with fatigue she required modA and her gait progressed to crouched with difficulty extending RLE in terminal stance. Pt then self propelled herself in the manual w/c with minA ~54ft on level surfaces, primarily using her BLE's for propulsion but after instruction provided on BUE placement, she was able to use those as well to assist; demo's poor efficiency and decreased propulsion efforts. Pt reporting moderate fatigue, therefore she was transported the remaining distance with totalA back to her room. Ended session seated in w/c, belt alarm on, needs in reach.  Therapy Documentation Precautions:  Precautions Precautions: Fall Precaution Comments: truncal and bil UE ataxia, severely impaired motor planning Restrictions Weight Bearing Restrictions: No Vital Signs: Therapy Vitals Pulse Rate: 62 Pain: Pain Assessment Pain Scale: 0-10 Pain Score: 3  Pain Location: Other (Comment) (right great and 2nd) Pain Orientation: Anterior Pain Descriptors / Indicators: Aching Pain Onset: On-going Patients Stated Pain Goal: 1 Pain Intervention(s): Medication (See eMAR) Multiple Pain Sites: No  Therapy/Group: Individual Therapy  Felicie Kocher P Sherlyn Ebbert PT 08/13/2020, 10:07 AM

## 2020-08-13 NOTE — Significant Event (Signed)
Hypoglycemic Event  CBG:67  Treatment: 4 oz juice/soda ensure   Symptoms: None  Follow-up CBG: Time:1215 CBG Result:137Possible Reasons for Event: Unknown  Comments/MD notified:Dan PA    Bartholomew Boards

## 2020-08-13 NOTE — Progress Notes (Signed)
Occupational Therapy Session Note  Patient Details  Name: Shawna Curry MRN: [id number] Date of Birth: March 22, 1933  Today's Date: 08/13/2020 OT Individual Time: 9449-6759 OT Individual Time Calculation (min): 60 min    Short Term Goals: Week 2:  OT Short Term Goal 1 (Week 2): Pt will be able to rise to stand with LRAD with mod A of 1. OT Short Term Goal 2 (Week 2): Pt will complete stand pivot to toilet with mod-max A of 1. OT Short Term Goal 3 (Week 2): Pt will be able to stand at toilet with grab bar with mod A of 1 while 2nd helper A with clothing management and clean up.  Skilled Therapeutic Interventions/Progress Updates:      Pt seen for BADL retraining of toileting, bathing, and dressing with a focus on coordination, motor planning, balance. See details below for details in ADL documentation. Overall, pt demonstrated SIGNIFICANT improvement today with her mobility. Due to her apraxia, pt needs EXTRA time and many verbal cues with caution to not provide too many tactile cues as this actually confuses the pt more.  She does need min A to rise to stand and stay stable within the walker.  She does have a strong posterior lean, pressing her hips back but can self correct with cues of "get as tall as possible".    Excellent participation.   Pt resting in w/c with belt alarm on and all needs met.   Therapy Documentation Precautions:  Precautions Precautions: Fall Precaution Comments: truncal and bil UE ataxia, severely impaired motor planning Restrictions Weight Bearing Restrictions: No       Pain: c/o R toe pain, RN aware    ADL: ADL Eating: Set up Where Assessed-Eating: Wheelchair Grooming: Minimal cueing, Minimal assistance Where Assessed-Grooming: Sitting at sink Upper Body Bathing: Supervision/safety Where Assessed-Upper Body Bathing: Shower Lower Body Bathing: Moderate assistance Where Assessed-Lower Body Bathing: Shower Upper Body Dressing: Minimal assistance Where  Assessed-Upper Body Dressing: Wheelchair Lower Body Dressing: Maximal assistance Where Assessed-Lower Body Dressing: Wheelchair Toileting: Moderate assistance Where Assessed-Toileting: Glass blower/designer: Minimal assistance, Maximal verbal cueing Toilet Transfer Method: Counselling psychologist: Raised toilet seat, Energy manager: Minimal assistance, Maximal cueing Social research officer, government Method: Ambulating, Stand pivot [employer]: Grab bars, Transfer tub bench   Therapy/Group: Individual Therapy  [place] 08/13/2020, 12:37 PM

## 2020-08-13 NOTE — Progress Notes (Signed)
Speech Language Pathology Daily Session Note  Patient Details  Name: Shawna Curry MRN: [id number] Date of Birth: [birth year]  Today's Date: 08/13/2020 SLP Individual Time: 1300-1400 SLP Individual Time Calculation (min): 60 min  Short Term Goals: Week 2: SLP Short Term Goal 1 (Week 2): Patient will perform basic functional ADL tasks with min A and 80% accuracy. SLP Short Term Goal 2 (Week 2): Patient will orient to time/situation/place with min A for use of visual/written aids after set up assistance. SLP Short Term Goal 3 (Week 2): Patient will perform selective attention task for increments of 5 minutes with min-modA for redirection.  Skilled Therapeutic Interventions:   Patient seen for skilled ST session focusing on cognitive function goals. Patient was finishing lunch and when SLP brought her to therapy room in her [facility], he noticed she seemed to have had an incident of urinary incontinence versus spilling something on her pants. RN tech was informed after session and towel was placed on patient's lap. Patient was able to use calendar to answer questions related to time and place orientation. She was at supervision level for responding to questions: What month? What year? But required minA for date and day of week. She participated in task of filling pill box with AM and PM. She was able to perform when only putting one pill in AM spots, missing one day. She required mod-maximal A for completing pill box for a two time a day medication. She did not notice errors until SLP pointed them out but she was then able to correct errors with min-mod A. During error simulation (SLP filled pill box and told patient there were two errors) she was able to correctly identify error when SLP told her which day of week to look in; "there's two green ones", "there's no white one", etc. Patient continues to benefit from skilled SLP intervention to maximize cognitive-linguistic functioning prior to  discharge.  Pain Pain Assessment Pain Scale: 0-10 Pain Score: 0-No pain  Therapy/Group: Individual Therapy  Sonia Baller, MA, CCC-SLP Speech Therapy

## 2020-08-13 NOTE — Progress Notes (Signed)
Occupational Therapy Session Note  Patient Details  Name: Jadie Allington MRN: [id number] Date of Birth: March 05, 1933  Today's Date: 08/13/2020 OT Individual Time: 7618-4859 OT Individual Time Calculation (min): 30 min    Short Term Goals: Week 2:  OT Short Term Goal 1 (Week 2): Pt will be able to rise to stand with LRAD with mod A of 1. OT Short Term Goal 2 (Week 2): Pt will complete stand pivot to toilet with mod-max A of 1. OT Short Term Goal 3 (Week 2): Pt will be able to stand at toilet with grab bar with mod A of 1 while 2nd helper A with clothing management and clean up.  Skilled Therapeutic Interventions/Progress Updates:    Pt sitting up in w/c, no c/o pain, agreeable to OT session.  Pt transported to ADL kitchen for time mgt.  Pt participated in standing functional activity to improve balance, weight shifting, and standing tolerance.  Pt completed sit<>stands at kitchen sink and kitchen table with min assist.  CGA needed to hand wash dishes and place on towel to left of sink and then to fold laundry.  No LOB noted throughout.  Pt needing intermittent VCs and TCs to shift weight to left to to mild right sided leaning.  Pt transported to room, call bell in reach, seat belt alarm on.  Therapy Documentation Precautions:  Precautions Precautions: Fall Precaution Comments: truncal and bil UE ataxia, severely impaired motor planning Restrictions Weight Bearing Restrictions: No   Therapy/Group: Individual Therapy  Ezekiel Slocumb 08/13/2020, 4:30 PM

## 2020-08-14 ENCOUNTER — Inpatient Hospital Stay (HOSPITAL_COMMUNITY): Payer: Medicare HMO

## 2020-08-14 ENCOUNTER — Ambulatory Visit (HOSPITAL_COMMUNITY): Payer: Medicare HMO | Admitting: Occupational Therapy

## 2020-08-14 LAB — GLUCOSE, CAPILLARY
Glucose-Capillary: 103 mg/dL — ABNORMAL HIGH (ref 70–99)
Glucose-Capillary: 105 mg/dL — ABNORMAL HIGH (ref 70–99)
Glucose-Capillary: 92 mg/dL (ref 70–99)
Glucose-Capillary: 219 mg/dL — ABNORMAL HIGH (ref 70–99)

## 2020-08-14 MED ORDER — AMIODARONE HCL 200 MG PO TABS
200.0000 mg | ORAL_TABLET | Freq: Every day | ORAL | Status: DC
  Administered 2020-08-15 – 2020-08-21 (×7): 200 mg via ORAL
  Filled 2020-08-14 (×7): qty 1

## 2020-08-14 NOTE — Patient Care Conference (Signed)
Inpatient RehabilitationTeam Conference and Plan of Care Update Date: 08/14/2020   Time: 11:20 AM    Patient Name: Shawna Curry      Medical Record Number: [id number]  Date of Birth: 1933-03-08 Sex: Female         Room/Bed: 4W23C/4W23C-01 Payor Info: Payor: [employer] MEDICARE / Plan: [place] HMO / Product Type: *No Product type* /    Admit Date/Time:  08/02/2020  5:02 PM  Primary Diagnosis:  Right sided cerebral infarction [facility])  Hospital Problems: Principal Problem:   Right sided cerebral infarction [facility]) Active Problems:   Anemia   Labile blood glucose   Dysphagia, post-stroke    Expected Discharge Date: Expected Discharge Date: 08/21/20  Team Members Present: Physician leading conference: Dr. Delice Lesch Care Coodinator Present: Dorien Chihuahua, RN, BSN, CRRN;Other (comment) Jacqlyn Larsen Dupree, SW) Nurse Present: Isla Pence, RN PT Present: Other (comment) (Christain Manhard, PT) OT Present: Meriel Pica, OT SLP Present: Charolett Bumpers, SLP PPS Coordinator present : Ileana Ladd, Burna Mortimer, SLP     Current Status/Progress Goal Weekly Team Focus  Bowel/Bladder             Swallow/Nutrition/ Hydration             ADL's   min A UB self care, mod LB bathing, max LB dressing, mod toileting, min stand pivot transfer with RW with significant verbal cues and extra time for pt to rise to stand  min - mod assist BADLs and functional transfers  ADL training, standing balance, postural stability, functional mobility, coordination, memory   Mobility   Fluctuating performance. Recently has been min/modA for most mobility, progressing gait with RW  modA bed mobility, modA transfers, modA gait  gait progresions, balance, general strengthening   Communication             Safety/Cognition/ Behavioral Observations  modA basic functional tasks, mod-maxA for memory, safety awareness  minA  functional problem solving, use of orientation/memory aids   Pain             Skin                Discharge Planning:  Daughter here daily and has seen in therapies, pleased with her progress this week, seems to have turned a corner   Team Discussion: AKI; need for IVF reviewed. Making good progress overall even with apraxia and posterior lean. Foot wound (chronic and being followed by wound care PTA) continue with Santyl ointment dressings. Note foot pain and discussion regarding elevation of legs when sitting = recliner vs wheelchair. Patient on target to meet rehab goals: yes, currently min-mod assist with toileting, transfers, ambulation, etc.   *See Care Plan and progress notes for long and short-term goals.   Revisions to Treatment Plan:  PT and OT upgraded goals SLP added a memory book and focus on orientation, recall and memory issues with constant reminders of daily activities Teaching Needs: Medications, wound care, toileting, transfers, etc.  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  Barriers to Discharge: Decreased family/caregiver support;Other (comments);Incontinence;Medical stability  Barriers to Discharge Comments: Dementia Possible Resolutions to [employer] Focus: Therapies, follow labs, encourage fluids   Continued Need for Acute Rehabilitation Level of Care: The patient requires  daily medical management by a physician with specialized training in physical medicine and rehabilitation for the following reasons: Direction of a multidisciplinary physical rehabilitation program to maximize functional independence : Yes Medical management of patient stability for increased activity during participation in an intensive rehabilitation regime.: Yes Analysis of  laboratory values and/or radiology reports with any subsequent need for medication adjustment and/or medical intervention. : Yes   I attest that I was present, lead the team conference, and concur with the assessment and plan of the team.   Dorien Chihuahua B 08/14/2020, 3:04 PM

## 2020-08-14 NOTE — Progress Notes (Signed)
Patient ID: Shawna Curry, female   DOB: September 29, 1933, 86 y.o.   MRN: [id number]  Met with pt and daughter who is here visiting to discuss team conference progress toward her goals and new discharge date 9/15. Pt feels she does better some days and other not so much. Daughter concerned she will on track for goals and will be ready on 9/15. Aware she will need 24 hr physical care at discharge and between she and her brother they can provide this. Well care was active prior to admission and will resume them. Has all equipment according to daughter. Will work on discharge needs, encouraged daughter to attend therapy with pt this afternoon since here.

## 2020-08-14 NOTE — Progress Notes (Addendum)
[place] PHYSICAL MEDICINE & REHABILITATION PROGRESS NOTE   Subjective/Complaints: Patient seen sitting up in bed this morning working with therapies.  She states she slept well overnight.  Discussed cognition/memory with therapies.  ROS: Denies CP, SOB, N/V/D  Objective:   No results found. Recent Labs    08/12/20 0635  WBC 8.1  HGB 10.3*  HCT 32.2*  PLT 207   Recent Labs    08/12/20 0635 08/13/20 0816  NA 138 138  K 4.7 4.5  CL 102 103  CO2 25 25  GLUCOSE 101* 151*  BUN 28* 30*  CREATININE 1.36* 1.41*  CALCIUM 9.0 8.8*    Intake/Output Summary (Last 24 hours) at 08/14/2020 1037 Last data filed at 08/14/2020 0900 Gross per 24 hour  Intake 1140 ml  Output --  Net 1140 ml     Physical Exam: Vital Signs Blood pressure 120/67, pulse 60, temperature 97.8 F (36.6 C), resp. rate 16, height 5\' 8"  (1.727 m), weight 51.6 kg, SpO2 100 %. Constitutional: No distress . Vital signs reviewed. HENT: Normocephalic.  Atraumatic. Eyes: EOMI. No discharge. Cardiovascular: No JVD.  RRR. +Murmur. Respiratory: Normal effort.  No stridor.  Bilateral clear to auscultation. GI: Non-distended.  BS +. Skin: Warm and dry.  Intact. Psych: Pleasantly confused. Musc: No edema in extremities.  No tenderness in extremities. Neuro: Alert and oriented x2 Motor: RUE: 2/5 shoulder abduction, 4-4+/5 distally, unchanged LUE: 4+/5 proximal distal Bilateral lower extremities: 4+/5 distal, unchanged   Assessment/Plan: 1. Functional deficits secondary to right sided cerebral infarction which require 3+ hours per day of interdisciplinary therapy in a comprehensive inpatient rehab setting.  Physiatrist is providing close team supervision and 24 hour management of active medical problems listed below.  Physiatrist and rehab team continue to assess barriers to discharge/monitor patient progress toward functional and medical goals  Care Tool:  Bathing    Body parts bathed by patient: Right arm,  Left arm, Chest, Abdomen, Face, Right upper leg, Left upper leg, Front perineal area   Body parts bathed by helper: Right lower leg, Left lower leg, Buttocks Body parts n/a: Buttocks, Front perineal area   Bathing assist Assist Level: Moderate Assistance - Patient 50 - 74%     Upper Body Dressing/Undressing Upper body dressing   What is the patient wearing?: Pull over shirt    Upper body assist Assist Level: Minimal Assistance - Patient > 75%    Lower Body Dressing/Undressing Lower body dressing      What is the patient wearing?: Incontinence brief, Pants     Lower body assist Assist for lower body dressing: Maximal Assistance - Patient 25 - 49%     Toileting Toileting    Toileting assist Assist for toileting: Moderate Assistance - Patient 50 - 74%     Transfers Chair/bed transfer  Transfers assist     Chair/bed transfer assist level: Minimal Assistance - Patient > 75%     Locomotion Ambulation   Ambulation assist   Ambulation activity did not occur: Safety/medical concerns  Assist level: Minimal Assistance - Patient > 75% Assistive device: Walker-rolling Max distance: 115ft   Walk 10 feet activity   Assist  Walk 10 feet activity did not occur: Safety/medical concerns  Assist level: Minimal Assistance - Patient > 75% Assistive device: Walker-rolling   Walk 50 feet activity   Assist Walk 50 feet with 2 turns activity did not occur: Safety/medical concerns  Assist level: Minimal Assistance - Patient > 75% Assistive device: Walker-rolling    Walk  150 feet activity   Assist Walk 150 feet activity did not occur: Safety/medical concerns  Assist level: Minimal Assistance - Patient > 75% Assistive device: Walker-rolling    Walk 10 feet on uneven surface  activity   Assist Walk 10 feet on uneven surfaces activity did not occur: Safety/medical concerns   Assist level: Moderate Assistance - Patient - 50 - 74% Assistive device: Horticulturist, commercial Will patient use wheelchair at discharge?: Yes Type of Wheelchair: Manual    Wheelchair assist level: Maximal Assistance - Patient 25 - 49% Max wheelchair distance: 50    Wheelchair 50 feet with 2 turns activity    Assist        Assist Level: Maximal Assistance - Patient 25 - 49%   Wheelchair 150 feet activity     Assist      Assist Level: Dependent - Patient 0%   Blood pressure 120/67, pulse 60, temperature 97.8 F (36.6 C), resp. rate 16, height 5\' 8"  (1.727 m), weight 51.6 kg, SpO2 100 %.  Medical Problem List and Plan: 1.  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 Please see conference note from today as well.  2.  Antithrombotics: -DVT/anticoagulation: Intravenous heparin transitioned back to Eliquis             -antiplatelet therapy: N/A 3. Pain Management: Neurontin 300 mg twice daily and muscle rub crea. Has some right foot pain but overall well controlled.   Monitor with increased mobility 4. Mood: Provide emotional support             -antipsychotic agents: N/A 5. Neuropsych: This patient is not capable of making decisions on her own behalf. 6. Skin/Wound Care: Routine skin checks 7. Fluids/Electrolytes/Nutrition: Routine in and outs. 8.  Atrial fibrillation.  Amiodarone 200 mg twice daily, Toprol-XL 25 mg daily. Will need outpatient f/u with cardiology.              Monitor with increased exertion. 9.  CAD/non-STEMI.  Follow-up cardiology services 10.  Type 2 diabetes mellitus with hyperglycemia.  Hemoglobin A1c 6.2.  Patient on Glucophage 500 mg twice  daily prior to admission.    Resumed            Relatively controlled on 9/8  Monitor with increased mobility. 11.  Acute on chronic systolic congestive heart failure.  Lasix 40 mg daily.  Monitor for any signs of fluid overload             Daily weights. Filed Weights   08/12/20 0643 08/13/20 0500 08/14/20 0457  Weight: 52.4 kg 52.1 kg 51.6 kg   Stable on 9/8 12.  History of CEA as well as left carotid stenosis.  Follow-up vascular surgery 13.  Hyperlipidemia: Lipitor 14.  AKI.              Creatinine 1.41 on 9/7  Encourage fluids 15.  Anemia  Hemoglobin 10.3 on 9/6  Continue to monitor 16.  Post stroke dysphagia  D3 thins, advance diet as tolerated  LOS: 12 days A FACE TO FACE EVALUATION WAS PERFORMED  Hatsue Sime Lorie Phenix 08/14/2020, 10:37 AM

## 2020-08-14 NOTE — Progress Notes (Signed)
Occupational Therapy Session Note  Patient Details  Name: Shawna Curry MRN: [id number] Date of Birth: 02-03-1933  Today's Date: 08/14/2020 OT Co-Treatment Time: 1015-1100 OT Co-Treatment Time Calculation (min): 45 min  (total session time [phone])   Short Term Goals: Week 2:  OT Short Term Goal 1 (Week 2): Pt will be able to rise to stand with LRAD with mod A of 1. OT Short Term Goal 2 (Week 2): Pt will complete stand pivot to toilet with mod-max A of 1. OT Short Term Goal 3 (Week 2): Pt will be able to stand at toilet with grab bar with mod A of 1 while 2nd helper A with clothing management and clean up.  Skilled Therapeutic Interventions/Progress Updates:    Pt seen this session for a co-tx session with her PT.    Focus of session was on postural control with standing and ambulation using multimodal cues of verbal cues to stand tall, keep head up; visual cues of targets.  Activities included: -Bed mobility and bridging to don pants over hips in bed -Sitting to EOB with close S -Standing to rW with min A --Ambulation to toilet with min to CGA -toileting with mod A for clothing management, pt self cleansed -Standing at sink with min to wash hands with cues for finding soap, turning water off -ambulation in hallway with cues for pacing steps and fixing eye gaze upwards -in gym worked on [employer] with overhead reach to clean off standing mirror - pt  Instinctively reached forward to floor level to wash bottom half of the mirror!  (pt had one hand support on RW at all times) -attempted 1 stand without Rw with pt holding a basket but pt unable to hold stand and pushed back needing max A to hold balance before sitting in wc to rest -seated B sh flex AAROM using a hula hoop to reach overhead.  - in standing to facilitate a forward lean as pt tends to place all her weight in her heels , place a small wedge under her heels to shift her weight. Pt tolerated this well and was able to maintain  a more upright stand.  -her daughter arrived and pt worked on standing to give her dtr a hug The PT transported pt back to her room.   Therapy Documentation Precautions:  Precautions Precautions: Fall Precaution Comments: truncal and bil UE ataxia, severely impaired motor planning Restrictions Weight Bearing Restrictions: No       Pain: Pain Assessment Pain Score: 0-No pain ADL: ADL Eating: Set up Where Assessed-Eating: Wheelchair Grooming: Minimal cueing, Minimal assistance Where Assessed-Grooming: Sitting at sink Upper Body Bathing: Supervision/safety Where Assessed-Upper Body Bathing: Shower Lower Body Bathing: Moderate assistance Where Assessed-Lower Body Bathing: Shower Upper Body Dressing: Minimal assistance Where Assessed-Upper Body Dressing: Wheelchair Lower Body Dressing: Maximal assistance Where Assessed-Lower Body Dressing: Wheelchair Toileting: Moderate assistance Where Assessed-Toileting: Glass blower/designer: Minimal assistance, Maximal verbal cueing Toilet Transfer Method: Counselling psychologist: Raised toilet seat, Energy manager: Minimal assistance, Maximal cueing Social research officer, government Method: Ambulating, Stand pivot [employer]: Grab bars, Transfer tub bench   Therapy/Group: Co-Treatment  [place] 08/14/2020, 11:07 AM

## 2020-08-14 NOTE — Progress Notes (Signed)
Speech Language Pathology Daily Session Note  Patient Details  Name: Shawna Curry MRN: [id number] Date of Birth: October 05, 1933  Today's Date: 08/14/2020 SLP Individual Time: 1155-2080 SLP Individual Time Calculation (min): 56 min  Short Term Goals: Week 2: SLP Short Term Goal 1 (Week 2): Patient will perform basic functional ADL tasks with min A and 80% accuracy. SLP Short Term Goal 2 (Week 2): Patient will orient to time/situation/place with min A for use of visual/written aids after set up assistance. SLP Short Term Goal 3 (Week 2): Patient will perform selective attention task for increments of 5 minutes with min-modA for redirection.  Skilled Therapeutic Interventions:Skilled ST services focused on education and cognitive skills. Pt was not initially orientated to place or time, only person. SLP posted visual aids in the room to inform pt of place and time (calendar) and after education of signs pt demonstrated immediate orientation with supervision A verbal cues. However pt continues to demonstrate poor delayed short term recall in 3 and 5 minute intervals, pt required mod A verbal cues for use of visual aids. SLP spent a large portion of the treatment session communicating with pt's daughter Rollene Fare via phone. Rollene Fare supports slight baseline impairments in orientation and short term recall requiring supervision A piror to acute CVA. SLP provided education on ways to assist orientation and short term recall with use of environmental visual aids and memory notebook. All questions were answered to satisfaction. SLP recorded events in pt's memory notebook and pt demonstrated ability to read simple phrases with mod A verbal cues. Pt was left in room with call bell within reach and bed alarm set. SLP recommends to continue skilled services.     Pain Pain Assessment Pain Score: 0-No pain  Therapy/Group: Individual Therapy  Cadience Bradfield  [facility] 08/14/2020, 12:54 PM

## 2020-08-14 NOTE — Progress Notes (Signed)
Physical Therapy Session Note  Patient Details  Name: Shawna Curry MRN: [id number] Date of Birth: [birth year]  Today's Date: 08/14/2020 PT Individual Time: 2694-8546 + 1300-1415 PT Individual Time Calculation (min): 30 min +75 min  Short Term Goals: Week 2:  PT Short Term Goal 1 (Week 2): Pt will maintain sitting EOB with no more than minA and no BUE support PT Short Term Goal 2 (Week 2): Pt will consistently perform bed<>chair transfers with modA and LRAD PT Short Term Goal 3 (Week 2): Pt will self propel manual w/c 56ft with minA PT Short Term Goal 4 (Week 2): Pt will ambulate 53ft with modA and LRAD  Skilled Therapeutic Interventions/Progress Updates:      1st session: Pt received supine in bed, awake and agreeable to PT session. Session co-treated with Gregary Signs OT. During session, PT focus on balance, endurance, and gait progressions. She was oriented to self only, provided year "1997" when asked time and "Saturday" when asked date. Required max cues from therapist to visually scan room to locate marked off calendar and max cues for providing appropriate date. Donned her pants while supine in bed using bridging technique, see OT note for details. Performed supine<>sit with CGA with HOB flat, pt performed technique by doing supine to long sitting and then transitioning legs off of bed from there. Cues for anterior scooting at EOB to feet flat; demo's small amplitude bouts and instruction on anterior weight shift and using reciprocal pattern to advance. Once positioned at EOB, required OT assist for donning shoes (see OT note), PT providing CGA at the trunk due to her posterior bias. Performed sit<>stand transfer with minA from raised EOB height to RW, cues for forward weight shifting and hand placement. Ambulated with CGA and RW from her bed to the bathroom where OT assisted with toileting/self care tasks. Pt then ambulated from toilet to bedroom sink with CGA and RW where she was able to maintain  standing while washing her hands, therapist providing CGA/minA with cues for upright and erect posturing. Pt then ambulated from her bedroom to nurses station with CGA and intermittent minA and RW; cues for forward gaze, maintaining proximity to AD, and increasing B step length with higher cadence. Pt continues to demo effortful gait with R foot internally rotated (likely pre-morbid from prior R hip replacement). WC transport remaining distance to main therapy gym for time management. From there, performed standing ball raises to promote thoracic extension and upright posturing, requiring min/modA for stability, using mirror for visual feedback. After seated rest, instructed her to "wash"" mirror with wash cloth to promote forward weight shift and upright posturing, pt able to do so with minA guard and RW for stability. Appears that when instructed to perform novel tasks, she has much more difficulty processing and motor planning but familiarity she does much better. Attempted trial of ambulating while holding laundry basket with B hands but pt requiring maxA due to LOB posteriorly and pt expressing high fearfulness of falling, deferred further efforts. Performed standing with RW on tapered wedge under heels to promote anterior weight shift, which appeared to improve hip/knee alignment.  WC transport back to room at end of session where she remained seated in w/c, needs in reach, belt alarm on, daughter at bedside.  2nd session: Pt received sitting in w/c, daughter at bedside, pt agreeable to PT session. Pt denies pain at start of session but reports evolving R great toe pain as session progressed. Rest breaks provided as needed for pain management.  W/c transport from bedroom to ADL room with totalA for time management. In ADL room, ambulated variable distances with CGA/minA and RW and practiced stepping over threshold entrance x3 bouts with 2 reps each out, totaling 6 step-overs. Pt required minA for threshold  navigation and frequent cueing for RW management and stepping sequencing to clear threshold. After seated rest break, instructed her to find 3 items in kitchen including: Jello, coffee cup, and an apple. She was able to remember 1/3 items (jello) and successfully found 2/3 items with max cueing. She attempted twice to open the fridge even though she realized the 1st time the jello wasn't in the fridge. Pt then transported from ADL room to small therapy gym with totalA for energy conservation purposes. From there, she ambulated up/down x53ft ramp with CGA and RW, and went straight into car transfer which she performed with minA and RW. Therapist cueing for sequencing and RW management. After extended seated rest break, she ambulated from the small therapy gym back to her door entrance with RW and mostly CGA (progressing to min/modA towards end of distance 2/2 fatigue). With gait, she continues to demo decreased proximity to AD, step-to gait pattern with R foot internally rotated, and forward flexed trunk with downward gaze. Therapist cueing throughout for normalizing gait pattern and correcting postural awareness. She ended the session seated in w/c with needs in reach, daughter at bedside, belt alarm on.   Therapy Documentation Precautions:  Precautions Precautions: Fall Precaution Comments: truncal and bil UE ataxia, severely impaired motor planning Restrictions Weight Bearing Restrictions: No Pain: Pain Assessment Pain Score: 0-No pain  Therapy/Group: Individual Therapy  Brian Kocourek P Donny Heffern PT 08/14/2020, 12:31 PM

## 2020-08-15 ENCOUNTER — Inpatient Hospital Stay (HOSPITAL_COMMUNITY): Payer: Medicare HMO | Admitting: Occupational Therapy

## 2020-08-15 ENCOUNTER — Inpatient Hospital Stay (HOSPITAL_COMMUNITY): Payer: Medicare HMO

## 2020-08-15 DIAGNOSIS — I480 Paroxysmal atrial fibrillation: Secondary | ICD-10-CM

## 2020-08-15 LAB — GLUCOSE, CAPILLARY
Glucose-Capillary: 110 mg/dL — ABNORMAL HIGH (ref 70–99)
Glucose-Capillary: 113 mg/dL — ABNORMAL HIGH (ref 70–99)
Glucose-Capillary: 77 mg/dL (ref 70–99)
Glucose-Capillary: 102 mg/dL — ABNORMAL HIGH (ref 70–99)

## 2020-08-15 NOTE — Progress Notes (Signed)
Occupational Therapy Session Note  Patient Details  Name: Sheva Mcdougle MRN: [id number] Date of Birth: 1933-08-28  Today's Date: 08/15/2020 OT Individual Time: 0930-1020 OT Individual Time Calculation (min): 50 min    Short Term Goals: Week 2:  OT Short Term Goal 1 (Week 2): Pt will be able to rise to stand with LRAD with mod A of 1. OT Short Term Goal 2 (Week 2): Pt will complete stand pivot to toilet with mod-max A of 1. OT Short Term Goal 3 (Week 2): Pt will be able to stand at toilet with grab bar with mod A of 1 while 2nd helper A with clothing management and clean up.  Skilled Therapeutic Interventions/Progress Updates:    Pt seen this session for ADL training and functional mobility with family education with dtr. Pt received in bed and sat to EOB with S.  Pt's brief was wet and pt agreeable to walking to toilet. Rose to stand and ambulated with RW with min A.  Pt able to void further on toilet.  She then stepped into shower and bathed self on bench with close S and CGA only when leaning side to side for safety.  Ambulated to EOB to dress. See notes below.  She was able to don pants over feet with only guiding A.  Her daughter observed this session and stated she will be able to provide 24/7 A.  Pt opted to rest in bed, pt moved into supine with S and used bed controls to adjust HOB to her comfort level. Pt in bed with all needs met and alarm set.   Therapy Documentation Precautions:  Precautions Precautions: Fall Precaution Comments: truncal and bil UE ataxia, severely impaired motor planning Restrictions Weight Bearing Restrictions: No       Pain: Pain Assessment Pain Scale: 0-10 Pain Score: 0-No pain Pain Type: Acute pain Pain Location: Toe (Comment which one) (Great toe 2nd toe) Pain Orientation: Anterior Pain Descriptors / Indicators: Aching Pain Frequency: Intermittent Pain Onset: On-going Patients Stated Pain Goal: 1 Pain Intervention(s): Medication (See  eMAR) ADL: ADL Eating: Set up Where Assessed-Eating: Wheelchair Grooming: Minimal cueing, Minimal assistance Where Assessed-Grooming: Sitting at sink Upper Body Bathing: Supervision/safety Where Assessed-Upper Body Bathing: Shower Lower Body Bathing: Contact guard Where Assessed-Lower Body Bathing: Shower Upper Body Dressing: Minimal assistance Where Assessed-Upper Body Dressing: Edge of bed Lower Body Dressing: Moderate assistance Where Assessed-Lower Body Dressing: Edge of bed Toileting: Moderate assistance Where Assessed-Toileting: Glass blower/designer: Psychiatric nurse Method: Counselling psychologist: Raised toilet seat, Energy manager: Minimal cueing, Minimal assistance Social research officer, government Method: Heritage manager: Grab bars, Transfer tub bench  Therapy/Group: Individual Therapy  Dejuana Weist 08/15/2020, 12:36 PM

## 2020-08-15 NOTE — Progress Notes (Signed)
[place] PHYSICAL MEDICINE & REHABILITATION PROGRESS NOTE   Subjective/Complaints: Patient seen sitting up in bed this morning.  She states she slept well overnight.  No reported issues overnight.  ROS: Denies CP, SOB, N/V/D  Objective:   No results found. No results for input(s): WBC, HGB, HCT, PLT in the last 72 hours. Recent Labs    08/13/20 0816  NA 138  K 4.5  CL 103  CO2 25  GLUCOSE 151*  BUN 30*  CREATININE 1.41*  CALCIUM 8.8*    Intake/Output Summary (Last 24 hours) at 08/15/2020 1311 Last data filed at 08/15/2020 0900 Gross per 24 hour  Intake 840 ml  Output --  Net 840 ml     Physical Exam: Vital Signs Blood pressure 133/73, pulse 65, temperature 98.5 F (36.9 C), resp. rate 16, height 5\' 8"  (1.727 m), weight 51.6 kg, SpO2 99 %. Constitutional: No distress . Vital signs reviewed. HENT: Normocephalic.  Atraumatic. Eyes: EOMI. No discharge. Cardiovascular: No JVD.  RRR.  + Murmur. Respiratory: Normal effort.  No stridor.  Bilateral clear to auscultation. GI: Non-distended.  BS +. Skin: Warm and dry.  Intact. Psych: Pleasantly confused.   Musc: No edema in extremities.  No tenderness in extremities. Neuro: Alert and oriented x1 Motor: RUE: 2/5 shoulder abduction, 4-4+/5 distally, stable LUE: 4+/5 proximal distal, stable Bilateral lower extremities: 4+/5 distal, stable  Assessment/Plan: 1. Functional deficits secondary to right sided cerebral infarction which require 3+ hours per day of interdisciplinary therapy in a comprehensive inpatient rehab setting.  Physiatrist is providing close team supervision and 24 hour management of active medical problems listed below.  Physiatrist and rehab team continue to assess barriers to discharge/monitor patient progress toward functional and medical goals  Care Tool:  Bathing    Body parts bathed by patient: Right arm, Left arm, Chest, Abdomen, Face, Right upper leg, Left upper leg, Front perineal area, Right  lower leg, Left lower leg, Buttocks   Body parts bathed by helper: Right lower leg, Left lower leg, Buttocks Body parts n/a: Buttocks, Front perineal area   Bathing assist Assist Level: Contact Guard/Touching assist     Upper Body Dressing/Undressing Upper body dressing   What is the patient wearing?: Pull over shirt    Upper body assist Assist Level: Minimal Assistance - Patient > 75%    Lower Body Dressing/Undressing Lower body dressing      What is the patient wearing?: Incontinence brief, Pants     Lower body assist Assist for lower body dressing: Minimal Assistance - Patient > 75%     Toileting Toileting    Toileting assist Assist for toileting: Moderate Assistance - Patient 50 - 74%     Transfers Chair/bed transfer  Transfers assist     Chair/bed transfer assist level: Minimal Assistance - Patient > 75%     Locomotion Ambulation   Ambulation assist   Ambulation activity did not occur: Safety/medical concerns  Assist level: Minimal Assistance - Patient > 75% Assistive device: Walker-rolling Max distance: 246ft   Walk 10 feet activity   Assist  Walk 10 feet activity did not occur: Safety/medical concerns  Assist level: Minimal Assistance - Patient > 75% Assistive device: Walker-rolling   Walk 50 feet activity   Assist Walk 50 feet with 2 turns activity did not occur: Safety/medical concerns  Assist level: Minimal Assistance - Patient > 75% Assistive device: Walker-rolling    Walk 150 feet activity   Assist Walk 150 feet activity did not occur: Safety/medical concerns  Assist level: Minimal Assistance - Patient > 75% Assistive device: Walker-rolling    Walk 10 feet on uneven surface  activity   Assist Walk 10 feet on uneven surfaces activity did not occur: Safety/medical concerns   Assist level: Minimal Assistance - Patient > 75% Assistive device: Aeronautical engineer Will patient use wheelchair at  discharge?: No Type of Wheelchair: Manual    Wheelchair assist level: Maximal Assistance - Patient 25 - 49% Max wheelchair distance: 50    Wheelchair 50 feet with 2 turns activity    Assist        Assist Level: Maximal Assistance - Patient 25 - 49%   Wheelchair 150 feet activity     Assist      Assist Level: Dependent - Patient 0%   Blood pressure 133/73, pulse 65, temperature 98.5 F (36.9 C), resp. rate 16, height 5\' 8"  (1.727 m), weight 51.6 kg, SpO2 99 %.  Medical Problem List and Plan: 1.  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  Antithrombotics: -DVT/anticoagulation: Intravenous heparin transitioned back to Eliquis             -antiplatelet therapy: N/A 3. Pain Management: Neurontin 300 mg twice daily and muscle rub crea. Has some right foot pain but overall well controlled.   Monitor with increased mobility 4. Mood: Provide emotional support             -antipsychotic agents: N/A 5. Neuropsych: This patient is not capable of making decisions on her own behalf. 6. Skin/Wound Care: Routine skin checks 7. Fluids/Electrolytes/Nutrition: Routine in and outs. 8.  Atrial fibrillation.  Amiodarone 200 mg twice daily, decreased to daily after discussion with pharmacy  Toprol-XL 25 mg daily. Will need outpatient f/u with cardiology.              Monitor with increased exertion. 9.  CAD/non-STEMI.  Follow-up cardiology services 10.  Type 2 diabetes mellitus with hyperglycemia.  Hemoglobin A1c 6.2.  Patient on Glucophage 500 mg twice daily prior to admission.    Resumed            Labile, but?  Stabilizing on 9/9  Monitor with increased mobility. 11.  Acute on chronic systolic congestive heart failure.  Lasix 40 mg daily.  Monitor for any signs of fluid overload              Daily weights. Filed Weights   08/12/20 0643 08/13/20 0500 08/14/20 0457  Weight: 52.4 kg 52.1 kg 51.6 kg   Stable on 9/8, no labs for today 12.  History of CEA as well as left carotid stenosis.  Follow-up vascular surgery 13.  Hyperlipidemia: Lipitor 14.  AKI.              Creatinine 1.41 on 9/7  Encourage fluids 15.  Anemia  Hemoglobin 10.3 on 9/6  Continue to monitor 16.  Post stroke dysphagia  D3 thins, advance diet as tolerated  LOS: 13 days A FACE TO FACE EVALUATION WAS PERFORMED  Rohith Fauth Lorie Phenix 08/15/2020, 1:11 PM

## 2020-08-15 NOTE — Progress Notes (Signed)
Speech Language Pathology Weekly Progress and Session Note  Patient Details  Name: Arita Severtson MRN: [id number] Date of Birth: [birth year]  Beginning of progress report period: August 09, 2020 End of progress report period: August 15, 2020  Today's Date: 08/15/2020 SLP Individual Time: 1107-1207 SLP Individual Time Calculation (min): 60 min  Short Term Goals: Week 2: SLP Short Term Goal 1 (Week 2): Patient will perform basic functional ADL tasks with min A and 80% accuracy. SLP Short Term Goal 1 - Progress (Week 2): Met SLP Short Term Goal 2 (Week 2): Patient will orient to time/situation/place with min A for use of visual/written aids after set up assistance. SLP Short Term Goal 2 - Progress (Week 2): Not met SLP Short Term Goal 3 (Week 2): Patient will perform selective attention task for increments of 5 minutes with min-modA for redirection. SLP Short Term Goal 3 - Progress (Week 2): Met    New Short Term Goals: Week 3: SLP Short Term Goal 1 (Week 3): STG=LTG due to short ELOS  Weekly Progress Updates: Pt made great progress meeting 2 out 3 goals this reporting period. Pt is nearing min A to supervision A for basic problem solving within a 5-10 minute intervals with deficits in short term recall and orientation requiring mod-min A verbal/visual cues. Visual aids and memory notebook are utilized to increase short term recall and orientation. SLP will focus on carryover of orientation, short term recall, attention, functional problem solving skills and education in the last reporting period. Education is on going with daughter Rollene Fare. Pt would continue to benefit from skilled ST services in order to maximize functional independence and reduce burden of care, likely requiring supervision at discharge with continued skilled ST services.     Intensity: Minumum of 1-2 x/day, 30 to 90 minutes Frequency: 3 to 5 out of 7 days Duration/Length of Stay: 9/15 Treatment/Interventions: Cognitive  remediation/compensation;Cueing hierarchy;Functional tasks;Patient/family education;Internal/external aids   Daily Session  Skilled Therapeutic Interventions: Skilled ST services focused on education and cognitive skills.Pt was orientated to place mod I, and time with mod A fading to min A verbal cues for use of visual aids. Pt demonstrated ability to read today's events in memory notebook with extra time and min A verbal cues to correct errors. SLP facilitated basic problem solving, error awareness and short term recall skills utilizing novel card task played a basic level (Blink), pt demonstrated basic problem solving skills and nearing 5 minute intervals pt required mod A verbal cues fading to min A verbal for short term recall. Pt was unable to recall 3 rules when questioned but utilized visual aid to recall rules with min A verbal cues. Pt demonstrated sustained attention to task in 20 minute interval with mod A verbal cues. SLP provided education to daughter pertaining to deficits in memory and increase ability in basic problem solving. SLP suggested keeping a familiar routine at home and utilizing visual aids. All questions answered to satisfaction. Pt expressed need to urinate, however was unable to make it to the bathroom. SLP changed pt's brief and clothing in bed, pt followed 1 step commands. Pt was left in room with call bell within reach and bed alarm set. SLP recommends to continue skilled services.    General    Pain Pain Assessment Pain Scale: 0-10 Pain Score: 2  Pain Type: Acute pain Pain Location: Toe (Comment which one) (Great toe 2nd toe) Pain Orientation: Anterior Pain Descriptors / Indicators: Aching Pain Frequency: Intermittent Pain Onset: On-going Patients Stated  Pain Goal: 1 Pain Intervention(s): Medication (See eMAR)  Therapy/Group: Individual Therapy  Fritz Cauthon  [facility] 08/15/2020, 12:31 PM

## 2020-08-15 NOTE — Progress Notes (Signed)
Physical Therapy Session Note  Patient Details  Name: Shawna Curry MRN: [id number] Date of Birth: 06/07/1933  Today's Date: 08/15/2020  PT Individual Time: 1300-1345 + 1430-1500 PT Individual Time Calculation (min): 45 min + 30 min  Short Term Goals: Week 2:  PT Short Term Goal 1 (Week 2): Pt will maintain sitting EOB with no more than minA and no BUE support PT Short Term Goal 2 (Week 2): Pt will consistently perform bed<>chair transfers with modA and LRAD PT Short Term Goal 3 (Week 2): Pt will self propel manual w/c 87ft with minA PT Short Term Goal 4 (Week 2): Pt will ambulate 52ft with modA and LRAD  Skilled Therapeutic Interventions/Progress Updates:     1st session: Pt received supine in bed, daughter at bedside, pt agreeable to PT session. Performed supine<>sit with HOB elevated with supervision. Forward scooting at EOB with CGA, demo's small amplitude bouts. Sit<>stand transfer from raised EOB height to RW with minA, ambulated to bathroom toilet with CGA and RW. Once standing over toilet, instruction provided for pulling off pants which she was able to do with min guard and use LUE to manipulate pants. Unfortunately, after pulling her pants down, she was incontinent of urine in her brief. Required totalA for brief management; sit<>stand with modA from toilet to [place] and [place] for donning new brief. Ambulated back to her walker with CGA and RW and wheeled sinkside where she washed her hands. WC transport outside of ADL room in hallway for time management. She ambulated ~26ft with CGA and RW and performed stand<>sit with minA to couch, requiring assist for controlled lowering. Sit<>stand with modA from couch to RW and ambulated ~176ft in hallway with CGA and RW. Pt wheeled in w/c back to her room for time management and ended session seated in w/c with belt alarm on, needs in reach.  2nd session: Pt received seated in w/c, agreeable to PT session. Pt reports unrated R foot/toe pain. LPN  notified who reported that pain medication wouldn't be available until end of session. Rest breaks and mobility provided for pain management. WC transport to main therapy gym where she negotiated up/down x4 steps with B HR support and min/modA. Cues for step-to pattern with L foot leading ascent, R foot leading descent. Pt with increased difficulty during descent > ascent and continues to have crouched knees with hips shifted posteriorly, difficulty correcting. After seated rest break, performed standing task at counter with clothes lines pins, requiring minA for truncal stability. She performed lateral, forward, and midline reaching with both LUE/RUE to remove and place clothes pins. Pt reports worsening R foot pain, thus deferred further mobility. WC transport back to her room with totalA and ended session seated in w/c, belt alarm on, needs in reach. RN notified of pain.   Therapy Documentation Precautions:  Precautions Precautions: Fall Precaution Comments: truncal and bil UE ataxia, severely impaired motor planning Restrictions Weight Bearing Restrictions: No Vital Signs: Therapy Vitals Temp: 98.8 F (37.1 C) Pulse Rate: (!) 45 Resp: 17 BP: 103/66 Patient Position (if appropriate): Sitting Oxygen Therapy SpO2: 100 % O2 Device: Room Air Pain: Pain Assessment Pain Score: 0-No pain  Therapy/Group: Individual Therapy  Mihran Lebarron P Ameya Vowell PT 08/15/2020, 3:32 PM

## 2020-08-16 ENCOUNTER — Inpatient Hospital Stay (HOSPITAL_COMMUNITY): Payer: Medicare HMO

## 2020-08-16 ENCOUNTER — Inpatient Hospital Stay (HOSPITAL_COMMUNITY): Payer: Medicare HMO | Admitting: Occupational Therapy

## 2020-08-16 ENCOUNTER — Inpatient Hospital Stay (HOSPITAL_COMMUNITY): Payer: Medicare HMO | Admitting: Speech Pathology

## 2020-08-16 LAB — GLUCOSE, CAPILLARY
Glucose-Capillary: 78 mg/dL (ref 70–99)
Glucose-Capillary: 70 mg/dL (ref 70–99)
Glucose-Capillary: 115 mg/dL — ABNORMAL HIGH (ref 70–99)
Glucose-Capillary: 93 mg/dL (ref 70–99)

## 2020-08-16 NOTE — Progress Notes (Signed)
Occupational Therapy Weekly Progress Note  Patient Details  Name: Shawna Curry MRN: [id number] Date of Birth: [birth year]  Beginning of progress report period: August 09, 2020 End of progress report period: August 16, 2020  Today's Date: 08/16/2020 OT Individual Time: 0945-1100 OT Individual Time Calculation (min): 75 min    Patient has met 3 of 3 short term goals.  Pt has made a great deal of progress this past week.  Despite her many deficits, she has been able to tap into her familiar motor patterns to be able to complete mobility and self care at a min -mod A.  This is dramatic improvement over last week when she was still requiring +2 A and max - total A.  Patient continues to demonstrate the following deficits: muscle weakness and muscle joint tightness, decreased cardiorespiratoy endurance, impaired timing and sequencing, unbalanced muscle activation, motor apraxia and decreased coordination, decreased attention to right, decreased awareness, decreased problem solving, decreased safety awareness, decreased memory and delayed processing and decreased sitting balance, decreased standing balance, decreased postural control, hemiplegia and decreased balance strategies and therefore will continue to benefit from skilled OT intervention to enhance overall performance with BADL and iADL.  Patient progressing toward long term goals..  Plan of care revisions: LTGs upgraded due to pt's progression .   Problem: RH Balance Goal: LTG: Patient will maintain dynamic sitting balance (OT) Description: LTG:  Patient will maintain dynamic sitting balance with assistance during activities of daily living (OT) Flowsheets (Taken 08/16/2020 0913) LTG: Pt will maintain dynamic sitting balance during ADLs with: (LTG upgraded due to pt's progression) Supervision/Verbal cueing Note: LTG upgraded due to pt's progression Goal: LTG Patient will maintain dynamic standing with ADLs (OT) Description: LTG:   Patient will maintain dynamic standing balance with assist during activities of daily living (OT)  Flowsheets (Taken 08/16/2020 0913) LTG: Pt will maintain dynamic standing balance during ADLs with: (LTG upgraded due to pt's progression) Minimal Assistance - Patient > 75% Note: LTG upgraded due to pt's progression   Problem: Sit to Stand Goal: LTG:  Patient will perform sit to stand in prep for activites of daily living with assistance level (OT) Description: LTG:  Patient will perform sit to stand in prep for activites of daily living with assistance level (OT) Flowsheets (Taken 08/16/2020 0913) LTG: PT will perform sit to stand in prep for activites of daily living with assistance level: (LTG upgraded due to pt's progression) Minimal Assistance - Patient > 75% Note: LTG upgraded due to pt's progression   Problem: RH Eating Goal: LTG Patient will perform eating w/assist, cues/equip (OT) Description: LTG: Patient will perform eating with assist, with/without cues using equipment (OT) Flowsheets (Taken 08/16/2020 0913) LTG: Pt will perform eating with assistance level of: (LTG upgraded due to pt's progression) Set up assist Note: LTG upgraded due to pt's progression   Problem: RH Grooming Goal: LTG Patient will perform grooming w/assist,cues/equip (OT) Description: LTG: Patient will perform grooming with assist, with/without cues using equipment (OT) Flowsheets (Taken 08/16/2020 0913) LTG: Pt will perform grooming with assistance level of: (LTG upgraded due to pt's progression) Supervision/Verbal cueing Note: LTG upgraded due to pt's progression   Problem: RH Bathing Goal: LTG Patient will bathe all body parts with assist levels (OT) Description: LTG: Patient will bathe all body parts with assist levels (OT) Flowsheets (Taken 08/16/2020 0913) LTG: Pt will perform bathing with assistance level/cueing: (LTG upgraded due to pt's progression) Minimal Assistance - Patient > 75% Note: LTG  upgraded due to pt's  progression   Problem: RH Dressing Goal: LTG Patient will perform upper body dressing (OT) Description: LTG Patient will perform upper body dressing with assist, with/without cues (OT). Flowsheets (Taken 08/16/2020 0913) LTG: Pt will perform upper body dressing with assistance level of: (LTG upgraded due to pt's progression) Minimal Assistance - Patient > 75% Note: LTG upgraded due to pt's progression   Problem: RH Functional Use of Upper Extremity Goal: LTG Patient will use RT/LT upper extremity as a (OT) Description: LTG: Patient will use right/left upper extremity as a stabilizer/gross assist/diminished/nondominant/dominant level with assist, with/without cues during functional activity (OT) Flowsheets (Taken 08/16/2020 0913) LTG: Pt will use upper extremity in functional activity with assistance level of: (LTG upgraded due to pt's progression) Supervision/Verbal cueing Note: LTG upgraded due to pt's progression   Problem: RH Toilet Transfers Goal: LTG Patient will perform toilet transfers w/assist (OT) Description: LTG: Patient will perform toilet transfers with assist, with/without cues using equipment (OT) Flowsheets (Taken 08/16/2020 0913) LTG: Pt will perform toilet transfers with assistance level of: (LTG upgraded due to pt's progression) Minimal Assistance - Patient > 75% Note: LTG upgraded due to pt's progression  OT Short Term Goals Week 1:  OT Short Term Goal 1 (Week 1): Pt will demonstrate improve R hand coordination to be able to bring a spoon to her mouth with 50% accuracy. OT Short Term Goal 1 - Progress (Week 1): Met OT Short Term Goal 2 (Week 1): Pt will demonstrate improved coordination and praxis to wash UB with min A. OT Short Term Goal 2 - Progress (Week 1): Met OT Short Term Goal 3 (Week 1): Pt will demonstrate improved coordination and praxis to don a shirt with max A. OT Short Term Goal 3 - Progress (Week 1): Met OT Short Term Goal 4 (Week 1):  pt will be able to sit at EOB with close S for 5 minutes to prepare for a transfer. OT Short Term Goal 4 - Progress (Week 1): Met OT Short Term Goal 5 (Week 1): Pt will be able to rise to stand in a stedy lift with max A of 1 to demonstrate improved attention. OT Short Term Goal 5 - Progress (Week 1): Met Week 2:  OT Short Term Goal 1 (Week 2): Pt will be able to rise to stand with LRAD with mod A of 1. OT Short Term Goal 1 - Progress (Week 2): Met OT Short Term Goal 2 (Week 2): Pt will complete stand pivot to toilet with mod-max A of 1. OT Short Term Goal 2 - Progress (Week 2): Met OT Short Term Goal 3 (Week 2): Pt will be able to stand at toilet with grab bar with mod A of 1 while 2nd helper A with clothing management and clean up. OT Short Term Goal 3 - Progress (Week 2): Met Week 3:  OT Short Term Goal 1 (Week 3): STGs = upgraded LTGs  Skilled Therapeutic Interventions/Progress Updates:    Pt received in w/c dressed and ready for the day.  Pt agreeable to walking to the bathroom, pt's brief was wet but she was able to void more on toilet with a BM and urine.  Pt self cleansed, stood up without A, pulled pants over hips all with S.  Pt then walked to sink to wash hands.  Sat in wc to be transported to kitchen area.  She loves to bake, but no eggs available today so hopefully pt can do this during another session. Taken to [employer]  and worked on dynavision from seated and standing with good visual attention as she was able to locate and hit each of the lighted keys in a timely manner.  Modified the rings as pt does not have full sh flex ROM.  Sat on mat to work on R sh flexion AROM with ball rolls, UE ranger and zoom ball. Pt did extremely well with zoom ball opening arms into a wide abduction.  Pt agreeable to walking halfway back to her room and then she decided to continue all the way to her room (which was a very long distance).  Min A with cues to keep her head up and posture upright.   Towards end of walk, pt was quite fatigued but was able to return to EOB.  Supervision to get into bed.  Bed alarm set and all needs met.    Therapy Documentation Precautions:  Precautions Precautions: Fall Precaution Comments: truncal and bil UE ataxia, severely impaired motor planning Restrictions Weight Bearing Restrictions: No  Pain: Pain Assessment Pain Scale: 0-10 Pain Score: 5  Pain Type: Acute pain Pain Location: Toe (Comment which one) Pain Orientation: Right Pain Descriptors / Indicators: Aching Pain Frequency: Constant Pain Onset: On-going Patients Stated Pain Goal: 1 Pain Intervention(s): Medication (See eMAR) ADL: ADL Eating: Set up Where Assessed-Eating: Wheelchair Grooming: Minimal cueing, Minimal assistance Where Assessed-Grooming: Sitting at sink Upper Body Bathing: Supervision/safety Where Assessed-Upper Body Bathing: Shower Lower Body Bathing: Contact guard Where Assessed-Lower Body Bathing: Shower Upper Body Dressing: Minimal assistance Where Assessed-Upper Body Dressing: Edge of bed Lower Body Dressing: Moderate assistance Where Assessed-Lower Body Dressing: Edge of bed Toileting: Moderate assistance Where Assessed-Toileting: Glass blower/designer: Psychiatric nurse Method: Counselling psychologist: Raised toilet seat, Energy manager: Minimal cueing, Minimal assistance Social research officer, government Method: Heritage manager: Grab bars, Transfer tub bench   Therapy/Group: Individual Therapy  [place] 08/16/2020, 9:03 AM

## 2020-08-16 NOTE — Progress Notes (Signed)
Physical Therapy Session Note  Patient Details  Name: Shawna Curry MRN: [id number] Date of Birth: 1933/10/10  Today's Date: 08/16/2020 PT Individual Time: 0800-0855 PT Individual Time Calculation (min): 55 min   Short Term Goals: Week 2:  PT Short Term Goal 1 (Week 2): Pt will maintain sitting EOB with no more than minA and no BUE support PT Short Term Goal 2 (Week 2): Pt will consistently perform bed<>chair transfers with modA and LRAD PT Short Term Goal 3 (Week 2): Pt will self propel manual w/c 59ft with minA PT Short Term Goal 4 (Week 2): Pt will ambulate 106ft with modA and LRAD  Skilled Therapeutic Interventions/Progress Updates:    Pt received supine in bed, agreeable to PT session. Pt reports moderate R great toe pain, rest breaks provided for pain relief and RN notified after session who reports medication was delivered ~1hr prior to PT arrival. Supine<>sit with CGA with HOB elevated 10deg, forward scooting at EOB with CGA demonstrates small amplitude bouts. Performed sit<>stand transfer from EOB to RW with minA and ambulated to her bathroom with CGA and RW. While standing, she was able to doff lower body dressing with BUE's while therapist providing minA due to posterior bias. Pt successful in voiding at toilet and able to perform pericare with setupA. Ambulated sinkside with CGA and RW where she washed her hands with minA for trunk stability due to posterior lean. Cues throughout for upright and forward trunk lean. Due to fatigue, required seated rest break at sinkside where she then brushed her dentures with setupA, occasionally required VC via step-by-step cues for sequencing and locating toothbrush/toothpast/etc. WC transport outside [place] for time management where she ambulated 2x115ft with CGA and RW on unlevel brick pavers and also performed standing toe taps with CGA and RW on 4inch pavers. Rest breaks provided throughout due to both fatigue and worsening R foot pain. WC transport  back to her room and remained seated in w/c with belt alarm on, needs in reach.   Therapy Documentation Precautions:  Precautions Precautions: Fall Precaution Comments: truncal and bil UE ataxia, severely impaired motor planning Restrictions Weight Bearing Restrictions: No Vital Signs: Therapy Vitals Temp: 98 F (36.7 C) Pulse Rate: 65 BP: (!) 141/78 Patient Position (if appropriate): Lying Oxygen Therapy SpO2: 98 % O2 Device: Room Air Pain: Pain Assessment Pain Scale: 0-10 Pain Score: 5  Pain Type: Acute pain Pain Location: Toe (Comment which one) Pain Orientation: Right Pain Descriptors / Indicators: Aching Pain Frequency: Constant Pain Onset: On-going Patients Stated Pain Goal: 1 Pain Intervention(s): Medication (See eMAR)  Therapy/Group: Individual Therapy  Shawna Curry PT 08/16/2020, 7:55 AM

## 2020-08-16 NOTE — Plan of Care (Signed)
  Problem: RH Balance Goal: LTG: Patient will maintain dynamic sitting balance (OT) Description: LTG:  Patient will maintain dynamic sitting balance with assistance during activities of daily living (OT) Flowsheets (Taken 08/16/2020 0913) LTG: Pt will maintain dynamic sitting balance during ADLs with: (LTG upgraded due to pt's progression) Supervision/Verbal cueing Note: LTG upgraded due to pt's progression Goal: LTG Patient will maintain dynamic standing with ADLs (OT) Description: LTG:  Patient will maintain dynamic standing balance with assist during activities of daily living (OT)  Flowsheets (Taken 08/16/2020 0913) LTG: Pt will maintain dynamic standing balance during ADLs with: (LTG upgraded due to pt's progression) Minimal Assistance - Patient > 75% Note: LTG upgraded due to pt's progression   Problem: Sit to Stand Goal: LTG:  Patient will perform sit to stand in prep for activites of daily living with assistance level (OT) Description: LTG:  Patient will perform sit to stand in prep for activites of daily living with assistance level (OT) Flowsheets (Taken 08/16/2020 0913) LTG: PT will perform sit to stand in prep for activites of daily living with assistance level: (LTG upgraded due to pt's progression) Minimal Assistance - Patient > 75% Note: LTG upgraded due to pt's progression   Problem: RH Eating Goal: LTG Patient will perform eating w/assist, cues/equip (OT) Description: LTG: Patient will perform eating with assist, with/without cues using equipment (OT) Flowsheets (Taken 08/16/2020 0913) LTG: Pt will perform eating with assistance level of: (LTG upgraded due to pt's progression) Set up assist  Note: LTG upgraded due to pt's progression   Problem: RH Grooming Goal: LTG Patient will perform grooming w/assist,cues/equip (OT) Description: LTG: Patient will perform grooming with assist, with/without cues using equipment (OT) Flowsheets (Taken 08/16/2020 0913) LTG: Pt will perform  grooming with assistance level of: (LTG upgraded due to pt's progression) Supervision/Verbal cueing Note: LTG upgraded due to pt's progression   Problem: RH Bathing Goal: LTG Patient will bathe all body parts with assist levels (OT) Description: LTG: Patient will bathe all body parts with assist levels (OT) Flowsheets (Taken 08/16/2020 0913) LTG: Pt will perform bathing with assistance level/cueing: (LTG upgraded due to pt's progression) Minimal Assistance - Patient > 75% Note: LTG upgraded due to pt's progression   Problem: RH Dressing Goal: LTG Patient will perform upper body dressing (OT) Description: LTG Patient will perform upper body dressing with assist, with/without cues (OT). Flowsheets (Taken 08/16/2020 0913) LTG: Pt will perform upper body dressing with assistance level of: (LTG upgraded due to pt's progression) Minimal Assistance - Patient > 75% Note: LTG upgraded due to pt's progression   Problem: RH Functional Use of Upper Extremity Goal: LTG Patient will use RT/LT upper extremity as a (OT) Description: LTG: Patient will use right/left upper extremity as a stabilizer/gross assist/diminished/nondominant/dominant level with assist, with/without cues during functional activity (OT) Flowsheets (Taken 08/16/2020 0913) LTG: Pt will use upper extremity in functional activity with assistance level of: (LTG upgraded due to pt's progression) Supervision/Verbal cueing Note: LTG upgraded due to pt's progression   Problem: RH Toilet Transfers Goal: LTG Patient will perform toilet transfers w/assist (OT) Description: LTG: Patient will perform toilet transfers with assist, with/without cues using equipment (OT) Flowsheets (Taken 08/16/2020 0913) LTG: Pt will perform toilet transfers with assistance level of: (LTG upgraded due to pt's progression) Minimal Assistance - Patient > 75% Note: LTG upgraded due to pt's progression

## 2020-08-16 NOTE — Progress Notes (Signed)
Speech Language Pathology Daily Session Note  Patient Details  Name: Shawna Curry MRN: [id number] Date of Birth: [birth year]  Today's Date: 08/16/2020 SLP Individual Time: 1300-1400 SLP Individual Time Calculation (min): 60 min  Short Term Goals: Week 3: SLP Short Term Goal 1 (Week 3): STG=LTG due to short ELOS  Skilled Therapeutic Interventions:   Patient seen for skilled ST session with daughter present, with focus on cognitive-linguistic function. She was able to utilize calendar on wall when SLP and daughter gave visual cues to do so. Patient was able to recall having had PT session during which she was walking in the hall which occurred earlier this morning. She was able to identify problems in photos and provide solutions with minA. She participated in novel task (playing [employer], slightly modified) and demonstrated task learning after multiple trials with SLP reducing visual distractions. Selective attention during this was very good with minA cues. SLP discussed some options of cognitive tasks for after patient discharges home with daughter providing 24 hour supervision and support. Patient continues to benefit from skilled SLP intervention to maximize cognitive-linguistic function prior to discharge.   Pain Pain Assessment Pain Scale: 0-10 Pain Score: 0-No pain  Therapy/Group: Individual Therapy  Sonia Baller, MA, CCC-SLP 08/16/20 4:32 PM

## 2020-08-16 NOTE — Progress Notes (Signed)
[facility] PHYSICAL MEDICINE & REHABILITATION PROGRESS NOTE   Subjective/Complaints: Patient seen ambulating with therapy this morning.  She states she slept well overnight.  ROS: Denies CP, SOB, N/V/D  Objective:   No results found. No results for input(s): WBC, HGB, HCT, PLT in the last 72 hours. No results for input(s): NA, K, CL, CO2, GLUCOSE, BUN, CREATININE, CALCIUM in the last 72 hours.  Intake/Output Summary (Last 24 hours) at 08/16/2020 1019 Last data filed at 08/16/2020 0700 Gross per 24 hour  Intake 880 ml  Output --  Net 880 ml     Physical Exam: Vital Signs Blood pressure (!) 141/78, pulse 65, temperature 98 F (36.7 C), resp. rate 17, height 5\' 8"  (1.727 m), weight 52.9 kg, SpO2 98 %. Constitutional: No distress . Vital signs reviewed. HENT: Normocephalic.  Atraumatic. Eyes: EOMI. No discharge. Cardiovascular: No JVD.  RRR.  + Murmur. Respiratory: Normal effort.  No stridor.  Bilateral clear to auscultation. GI: Non-distended.  BS +. Skin: Warm and dry.  Intact. Psych: Pleasantly confused. Musc: No edema in extremities.  No tenderness in extremities. Neuro: Alert Motor: RUE: 2/5 shoulder abduction, 4-4+/5 distally, unchanged LUE: 4+/5 proximal distal, stable Bilateral lower extremities: 4+/5 distal, stable  Assessment/Plan: 1. Functional deficits secondary to right sided cerebral infarction which require 3+ hours per day of interdisciplinary therapy in a comprehensive inpatient rehab setting.  Physiatrist is providing close team supervision and 24 hour management of active medical problems listed below.  Physiatrist and rehab team continue to assess barriers to discharge/monitor patient progress toward functional and medical goals  Care Tool:  Bathing    Body parts bathed by patient: Right arm, Left arm, Chest, Abdomen, Face, Right upper leg, Left upper leg, Front perineal area, Right lower leg, Left lower leg, Buttocks   Body parts bathed by helper:  Right lower leg, Left lower leg, Buttocks Body parts n/a: Buttocks, Front perineal area   Bathing assist Assist Level: Contact Guard/Touching assist     Upper Body Dressing/Undressing Upper body dressing   What is the patient wearing?: Pull over shirt    Upper body assist Assist Level: Minimal Assistance - Patient > 75%    Lower Body Dressing/Undressing Lower body dressing      What is the patient wearing?: Incontinence brief, Pants     Lower body assist Assist for lower body dressing: Minimal Assistance - Patient > 75%     Toileting Toileting    Toileting assist Assist for toileting: Moderate Assistance - Patient 50 - 74%     Transfers Chair/bed transfer  Transfers assist     Chair/bed transfer assist level: Minimal Assistance - Patient > 75%     Locomotion Ambulation   Ambulation assist   Ambulation activity did not occur: Safety/medical concerns  Assist level: Minimal Assistance - Patient > 75% Assistive device: Walker-rolling Max distance: 225ft   Walk 10 feet activity   Assist  Walk 10 feet activity did not occur: Safety/medical concerns  Assist level: Minimal Assistance - Patient > 75% Assistive device: Walker-rolling   Walk 50 feet activity   Assist Walk 50 feet with 2 turns activity did not occur: Safety/medical concerns  Assist level: Minimal Assistance - Patient > 75% Assistive device: Walker-rolling    Walk 150 feet activity   Assist Walk 150 feet activity did not occur: Safety/medical concerns  Assist level: Minimal Assistance - Patient > 75% Assistive device: Walker-rolling    Walk 10 feet on uneven surface  activity   Assist  Walk 10 feet on uneven surfaces activity did not occur: Safety/medical concerns   Assist level: Minimal Assistance - Patient > 75% Assistive device: Aeronautical engineer Will patient use wheelchair at discharge?: No Type of Wheelchair: Manual    Wheelchair assist level:  Maximal Assistance - Patient 25 - 49% Max wheelchair distance: 50    Wheelchair 50 feet with 2 turns activity    Assist        Assist Level: Maximal Assistance - Patient 25 - 49%   Wheelchair 150 feet activity     Assist      Assist Level: Dependent - Patient 0%   Blood pressure (!) 141/78, pulse 65, temperature 98 F (36.7 C), resp. rate 17, height 5\' 8"  (1.727 m), weight 52.9 kg, SpO2 98 %.  Medical Problem List and Plan: 1.  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  Antithrombotics: -DVT/anticoagulation: Intravenous heparin transitioned back to Eliquis             -antiplatelet therapy: N/A 3. Pain Management: Neurontin 300 mg twice daily and muscle rub crea. Has some right foot pain but overall well controlled.   Monitor with increased mobility 4. Mood: Provide emotional support             -antipsychotic agents: N/A 5. Neuropsych: This patient is not capable of making decisions on her own behalf. 6. Skin/Wound Care: Routine skin checks 7. Fluids/Electrolytes/Nutrition: Routine in and outs. 8.  Atrial fibrillation.  Amiodarone 200 mg twice daily, decreased to daily after discussion with pharmacy  Toprol-XL 25 mg daily. Will need outpatient f/u with cardiology.   Controlled on 9/10             Monitor with increased exertion. 9.  CAD/non-STEMI.  Follow-up cardiology services 10.  Type 2 diabetes mellitus with hyperglycemia.  Hemoglobin A1c 6.2.  Patient on Glucophage 500 mg twice daily prior to admission.    Resumed            Improving on 9/10  Monitor with increased mobility. 11.  Acute on chronic systolic congestive heart failure.  Lasix 40 mg daily.  Monitor for any signs of fluid overload             Daily weights. Filed Weights   08/13/20 0500 08/14/20  0457 08/16/20 0500  Weight: 52.1 kg 51.6 kg 52.9 kg   Stable on 9/10 12.  History of CEA as well as left carotid stenosis.  Follow-up vascular surgery 13.  Hyperlipidemia: Lipitor 14.  AKI.              Creatinine 1.41 on 9/7, labs ordered for Monday  Encourage fluids 15.  Anemia  Hemoglobin 10.3 on 9/6, labs ordered for Monday  Continue to monitor 16.  Post stroke dysphagia  D3 thins, advance diet as tolerated  LOS: 14 days A FACE TO FACE EVALUATION WAS PERFORMED  Quintez Maselli Lorie [place] 08/16/2020, 10:19 AM

## 2020-08-17 DIAGNOSIS — I480 Paroxysmal atrial fibrillation: Secondary | ICD-10-CM

## 2020-08-17 LAB — GLUCOSE, CAPILLARY
Glucose-Capillary: 84 mg/dL (ref 70–99)
Glucose-Capillary: 100 mg/dL — ABNORMAL HIGH (ref 70–99)
Glucose-Capillary: 149 mg/dL — ABNORMAL HIGH (ref 70–99)
Glucose-Capillary: 105 mg/dL — ABNORMAL HIGH (ref 70–99)

## 2020-08-17 NOTE — Progress Notes (Signed)
[facility] PHYSICAL MEDICINE & REHABILITATION PROGRESS NOTE   Subjective/Complaints: Patient seen sitting up in bed this morning.  She states she slept well overnight.  She denies complaints.  No reported issues overnight.  ROS: Denies CP, SOB, N/V/D  Objective:   No results found. No results for input(s): WBC, HGB, HCT, PLT in the last 72 hours. No results for input(s): NA, K, CL, CO2, GLUCOSE, BUN, CREATININE, CALCIUM in the last 72 hours.  Intake/Output Summary (Last 24 hours) at 08/17/2020 1412 Last data filed at 08/16/2020 1700 Gross per 24 hour  Intake 120 ml  Output --  Net 120 ml     Physical Exam: Vital Signs Blood pressure 118/82, pulse 61, temperature 98.1 F (36.7 C), temperature source Oral, resp. rate 16, height 5\' 8"  (1.727 m), weight 52.3 kg, SpO2 100 %. Constitutional: No distress . Vital signs reviewed. HENT: Normocephalic.  Atraumatic. Eyes: EOMI. No discharge. Cardiovascular: No JVD.  RRR.  + Murmur. Respiratory: Normal effort.  No stridor.  Bilateral clear to auscultation. GI: Non-distended.  BS +. Skin: Warm and dry.  Intact. Psych: Pleasantly confused. Musc: No edema in extremities.  No tenderness in extremities. Neuro: Alert and oriented x1 Motor: RUE: 2/5 shoulder abduction, 4-4+/5 distally, stable LUE: 4+/5 proximal distal, stable Bilateral lower extremities: 4+/5 distal, stable  Assessment/Plan: 1. Functional deficits secondary to right sided cerebral infarction which require 3+ hours per day of interdisciplinary therapy in a comprehensive inpatient rehab setting.  Physiatrist is providing close team supervision and 24 hour management of active medical problems listed below.  Physiatrist and rehab team continue to assess barriers to discharge/monitor patient progress toward functional and medical goals  Care Tool:  Bathing    Body parts bathed by patient: Right arm, Left arm, Chest, Abdomen, Face, Right upper leg, Left upper leg, Front  perineal area, Right lower leg, Left lower leg, Buttocks   Body parts bathed by helper: Right lower leg, Left lower leg, Buttocks Body parts n/a: Buttocks, Front perineal area   Bathing assist Assist Level: Contact Guard/Touching assist     Upper Body Dressing/Undressing Upper body dressing   What is the patient wearing?: Pull over shirt    Upper body assist Assist Level: Minimal Assistance - Patient > 75%    Lower Body Dressing/Undressing Lower body dressing      What is the patient wearing?: Incontinence brief, Pants     Lower body assist Assist for lower body dressing: Minimal Assistance - Patient > 75%     Toileting Toileting    Toileting assist Assist for toileting: Supervision/Verbal cueing     Transfers Chair/bed transfer  Transfers assist     Chair/bed transfer assist level: Minimal Assistance - Patient > 75%     Locomotion Ambulation   Ambulation assist   Ambulation activity did not occur: Safety/medical concerns  Assist level: Minimal Assistance - Patient > 75% Assistive device: Walker-rolling Max distance: 229ft   Walk 10 feet activity   Assist  Walk 10 feet activity did not occur: Safety/medical concerns  Assist level: Minimal Assistance - Patient > 75% Assistive device: Walker-rolling   Walk 50 feet activity   Assist Walk 50 feet with 2 turns activity did not occur: Safety/medical concerns  Assist level: Minimal Assistance - Patient > 75% Assistive device: Walker-rolling    Walk 150 feet activity   Assist Walk 150 feet activity did not occur: Safety/medical concerns  Assist level: Minimal Assistance - Patient > 75% Assistive device: Walker-rolling    Walk  10 feet on uneven surface  activity   Assist Walk 10 feet on uneven surfaces activity did not occur: Safety/medical concerns   Assist level: Minimal Assistance - Patient > 75% Assistive device: Aeronautical engineer Will patient use wheelchair  at discharge?: No Type of Wheelchair: Manual    Wheelchair assist level: Maximal Assistance - Patient 25 - 49% Max wheelchair distance: 50    Wheelchair 50 feet with 2 turns activity    Assist        Assist Level: Maximal Assistance - Patient 25 - 49%   Wheelchair 150 feet activity     Assist      Assist Level: Dependent - Patient 0%   Blood pressure 118/82, pulse 61, temperature 98.1 F (36.7 C), temperature source Oral, resp. rate 16, height 5\' 8"  (1.727 m), weight 52.3 kg, SpO2 100 %.  Medical Problem List and Plan: 1.  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  Antithrombotics: -DVT/anticoagulation: Intravenous heparin transitioned back to Eliquis             -antiplatelet therapy: N/A 3. Pain Management: Neurontin 300 mg twice daily and muscle rub crea. Has some right foot pain but overall well controlled.   Monitor with increased mobility 4. Mood: Provide emotional support             -antipsychotic agents: N/A 5. Neuropsych: This patient is not capable of making decisions on her own behalf. 6. Skin/Wound Care: Routine skin checks 7. Fluids/Electrolytes/Nutrition: Routine in and outs. 8.  Atrial fibrillation.  Amiodarone 200 mg twice daily, decreased to daily after discussion with pharmacy  Toprol-XL 25 mg daily. Will need outpatient f/u with cardiology.   Controlled on 9/11             Monitor with increased exertion. 9.  CAD/non-STEMI.  Follow-up cardiology services 10.  Type 2 diabetes mellitus with hyperglycemia.  Hemoglobin A1c 6.2.  Patient on Glucophage 500 mg twice daily prior to admission.    Resumed            Controlled on 9/11  Monitor with increased mobility. 11.  Acute on chronic systolic congestive heart failure.  Lasix 40 mg daily.  Monitor  for any signs of fluid overload             Daily weights. Filed Weights   08/14/20 0457 08/16/20 0500 08/17/20 0500  Weight: 51.6 kg 52.9 kg 52.3 kg   Stable on 9/11 12.  History of CEA as well as left carotid stenosis.  Follow-up vascular surgery 13.  Hyperlipidemia: Lipitor 14.  AKI.              Creatinine 1.41 on 9/7, labs ordered for Monday  Encourage fluids 15.  Anemia  Hemoglobin 10.3 on 9/6, labs ordered for Monday  Continue to monitor 16.  Post stroke dysphagia  D3 thins, advance diet as tolerated  LOS: 15 days A FACE TO FACE EVALUATION WAS PERFORMED  Bristyn Kulesza Lorie Phenix 08/17/2020, 2:12 PM

## 2020-08-18 ENCOUNTER — Inpatient Hospital Stay (HOSPITAL_COMMUNITY): Payer: Medicare HMO | Admitting: Speech Pathology

## 2020-08-18 ENCOUNTER — Inpatient Hospital Stay (HOSPITAL_COMMUNITY): Payer: Medicare HMO

## 2020-08-18 ENCOUNTER — Encounter (HOSPITAL_COMMUNITY): Payer: Medicare HMO | Admitting: Occupational Therapy

## 2020-08-18 DIAGNOSIS — E119 Type 2 diabetes mellitus without complications: Secondary | ICD-10-CM

## 2020-08-18 LAB — GLUCOSE, CAPILLARY
Glucose-Capillary: 102 mg/dL — ABNORMAL HIGH (ref 70–99)
Glucose-Capillary: 72 mg/dL (ref 70–99)
Glucose-Capillary: 97 mg/dL (ref 70–99)
Glucose-Capillary: 177 mg/dL — ABNORMAL HIGH (ref 70–99)

## 2020-08-18 NOTE — Progress Notes (Signed)
[facility] PHYSICAL MEDICINE & REHABILITATION PROGRESS NOTE   Subjective/Complaints: Patient seen sitting up in bed this morning eating breakfast.  She states she slept well overnight.  No reported issues overnight.  ROS: Denies CP, SOB, N/V/D  Objective:   No results found. No results for input(s): WBC, HGB, HCT, PLT in the last 72 hours. No results for input(s): NA, K, CL, CO2, GLUCOSE, BUN, CREATININE, CALCIUM in the last 72 hours.  Intake/Output Summary (Last 24 hours) at 08/18/2020 1514 Last data filed at 08/18/2020 0849 Gross per 24 hour  Intake 600 ml  Output 1 ml  Net 599 ml     Physical Exam: Vital Signs Blood pressure 120/61, pulse 65, temperature 98.4 F (36.9 C), resp. rate 16, height 5\' 8"  (1.727 m), weight 52.3 kg, SpO2 100 %.  Constitutional: No distress . Vital signs reviewed. HENT: Normocephalic.  Atraumatic. Eyes: EOMI. No discharge. Cardiovascular: No JVD.  RRR.  + Murmur. Respiratory: Normal effort.  No stridor.  Bilateral clear to auscultation. GI: Non-distended.  BS +. Skin: Warm and dry.  Intact. Psych: Pleasantly confused. Musc: No edema in extremities.  No tenderness in extremities. Neuro: Alert and oriented x1 Motor: RUE: 2/5 shoulder abduction, 4-4+/5 distally, stable LUE: 4+/5 proximal distal, unchanged  Bilateral lower extremities: 4+/5 distal, stable  Assessment/Plan: 1. Functional deficits secondary to right sided cerebral infarction which require 3+ hours per day of interdisciplinary therapy in a comprehensive inpatient rehab setting.  Physiatrist is providing close team supervision and 24 hour management of active medical problems listed below.  Physiatrist and rehab team continue to assess barriers to discharge/monitor patient progress toward functional and medical goals  Care Tool:  Bathing    Body parts bathed by patient: Right arm, Left arm, Chest, Abdomen, Face, Right upper leg, Left upper leg, Front perineal area, Right lower  leg, Left lower leg, Buttocks   Body parts bathed by helper: Right lower leg, Left lower leg, Buttocks Body parts n/a: Buttocks, Front perineal area   Bathing assist Assist Level: Contact Guard/Touching assist     Upper Body Dressing/Undressing Upper body dressing   What is the patient wearing?: Pull over shirt    Upper body assist Assist Level: Minimal Assistance - Patient > 75%    Lower Body Dressing/Undressing Lower body dressing      What is the patient wearing?: Incontinence brief, Pants     Lower body assist Assist for lower body dressing: Minimal Assistance - Patient > 75%     Toileting Toileting    Toileting assist Assist for toileting: Supervision/Verbal cueing     Transfers Chair/bed transfer  Transfers assist     Chair/bed transfer assist level: Moderate Assistance - Patient 50 - 74%     Locomotion Ambulation   Ambulation assist   Ambulation activity did not occur: Safety/medical concerns  Assist level: Minimal Assistance - Patient > 75% Assistive device: Parallel bars Max distance: 20'   Walk 10 feet activity   Assist  Walk 10 feet activity did not occur: Safety/medical concerns  Assist level: Minimal Assistance - Patient > 75% Assistive device: Parallel bars   Walk 50 feet activity   Assist Walk 50 feet with 2 turns activity did not occur: Safety/medical concerns  Assist level: Minimal Assistance - Patient > 75% Assistive device: Walker-rolling    Walk 150 feet activity   Assist Walk 150 feet activity did not occur: Safety/medical concerns  Assist level: Minimal Assistance - Patient > 75% Assistive device: Walker-rolling  Walk 10 feet on uneven surface  activity   Assist Walk 10 feet on uneven surfaces activity did not occur: Safety/medical concerns   Assist level: Minimal Assistance - Patient > 75% Assistive device: Aeronautical engineer Will patient use wheelchair at discharge?: No Type of  Wheelchair: Manual    Wheelchair assist level: Maximal Assistance - Patient 25 - 49% Max wheelchair distance: 50    Wheelchair 50 feet with 2 turns activity    Assist        Assist Level: Maximal Assistance - Patient 25 - 49%   Wheelchair 150 feet activity     Assist      Assist Level: Dependent - Patient 0%   Blood pressure 120/61, pulse 65, temperature 98.4 F (36.9 C), resp. rate 16, height 5\' 8"  (1.727 m), weight 52.3 kg, SpO2 100 %.  Medical Problem List and Plan: 1.  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  Antithrombotics: -DVT/anticoagulation: Intravenous heparin transitioned back to Eliquis             -antiplatelet therapy: N/A 3. Pain Management: Neurontin 300 mg twice daily and muscle rub crea. Has some right foot pain but overall well controlled.   Monitor with increased mobility 4. Mood: Provide emotional support             -antipsychotic agents: N/A 5. Neuropsych: This patient is not capable of making decisions on her own behalf. 6. Skin/Wound Care: Routine skin checks 7. Fluids/Electrolytes/Nutrition: Routine in and outs. 8.  Atrial fibrillation.  Amiodarone 200 mg twice daily, decreased to daily after discussion with pharmacy  Toprol-XL 25 mg daily. Will need outpatient f/u with cardiology.   Controlled on 9/12             Monitor with increased exertion. 9.  CAD/non-STEMI.  Follow-up cardiology services 10.  Type 2 diabetes mellitus with hyperglycemia.  Hemoglobin A1c 6.2.  Patient on Glucophage 500 mg twice daily prior to admission.    Resumed            Controlled on 9/12  Monitor with increased mobility. 11.  Acute on chronic systolic congestive heart failure.  Lasix 40 mg daily.  Monitor for any signs of fluid overload             Daily  weights. Filed Weights   08/14/20 0457 08/16/20 0500 08/17/20 0500  Weight: 51.6 kg 52.9 kg 52.3 kg   Stable on 9/11, no weights for today 12.  History of CEA as well as left carotid stenosis.  Follow-up vascular surgery 13.  Hyperlipidemia: Lipitor 14.  AKI.              Creatinine 1.41 on 9/7, labs ordered for tomorrow  Encourage fluids 15.  Anemia  Hemoglobin 10.3 on 9/6, labs ordered for tomorrow  Continue to monitor 16.  Post stroke dysphagia  D3 thins, advance diet as tolerated  LOS: 16 days A FACE TO FACE EVALUATION WAS PERFORMED  Shawna Curry Lorie Phenix 08/18/2020, 3:14 PM

## 2020-08-18 NOTE — Progress Notes (Signed)
Speech Language Pathology Daily Session Note  Patient Details  Name: Shawna Curry MRN: [id number] Date of Birth: 1933-05-14  Today's Date: 08/18/2020 SLP Individual Time: 1300-1345 SLP Individual Time Calculation (min): 45 min  Short Term Goals: Week 3: SLP Short Term Goal 1 (Week 3): STG=LTG due to short ELOS  Skilled Therapeutic Interventions: Pt was seen for skilled ST targeting cognitive goals. SLP facilitated session with a PEG board task. Pt required Mod A verbal and visual cues for error awareness and problem solving, as well as recall/carryover of strategies utilized for problem solving throughout task. Design complexity remained fairly basic throughout. Pt also required Min A verbal cues to use aids in room for orientation to date and recall of anticipated d/c date. Pt left sitting in chair with alarm set and needs within reach. Continue per current plan of care.          Pain Pain Assessment Pain Scale: 0-10 Pain Score: 0-No pain  Therapy/Group: Individual Therapy  Arbutus Leas 08/18/2020, 3:01 PM

## 2020-08-18 NOTE — Progress Notes (Signed)
Occupational Therapy Session Note  Patient Details  Name: Shawna Curry MRN: [id number] Date of Birth: 19-Jun-1933  Today's Date: 08/18/2020 OT Group Time: 1100-1200 OT Group Time Calculation (min): 60 min  Skilled Therapeutic Interventions/Progress Updates:    Pt engaged in therapeutic w/c level dance group focusing on patient choice, UE/LE strengthening, salience, activity tolerance, and social participation. Pt was guided through various dance-based exercises involving UEs/LEs and trunk. All music was selected by group members. Emphasis placed on UE/LE coordination and praxis. Pt did very well in group, mimicked dance techniques of fellow members and stood with OT and Min balance assistance for 1 song while holding onto the back of a chair, OT held her Rt hand and we swayed together at this time. At end of session pt was escorted back to room by NT.    Therapy Documentation Precautions:  Precautions Precautions: Fall Precaution Comments: truncal and bil UE ataxia, severely impaired motor planning Restrictions Weight Bearing Restrictions: No Pain: no s/s pain during tx   ADL: ADL Eating: Set up Where Assessed-Eating: Wheelchair Grooming: Minimal cueing, Minimal assistance Where Assessed-Grooming: Sitting at sink Upper Body Bathing: Supervision/safety Where Assessed-Upper Body Bathing: Shower Lower Body Bathing: Contact guard Where Assessed-Lower Body Bathing: Shower Upper Body Dressing: Minimal assistance Where Assessed-Upper Body Dressing: Edge of bed Lower Body Dressing: Moderate assistance Where Assessed-Lower Body Dressing: Edge of bed Toileting: Moderate assistance Where Assessed-Toileting: Glass blower/designer: Psychiatric nurse Method: Counselling psychologist: Raised toilet seat, Energy manager: Minimal cueing, Minimal assistance Social research officer, government Method: Heritage manager: Grab bars, Transfer  tub bench :     Therapy/Group: Group Therapy  Lacharles Altschuler A Zayvier Caravello 08/18/2020, 12:49 PM

## 2020-08-19 ENCOUNTER — Inpatient Hospital Stay (HOSPITAL_COMMUNITY): Payer: Medicare HMO

## 2020-08-19 ENCOUNTER — Inpatient Hospital Stay (HOSPITAL_COMMUNITY): Payer: Medicare HMO | Admitting: Occupational Therapy

## 2020-08-19 LAB — BASIC METABOLIC PANEL
Anion gap: 9 (ref 5–15)
BUN: 29 mg/dL — ABNORMAL HIGH (ref 8–23)
CO2: 27 mmol/L (ref 22–32)
Calcium: 9 mg/dL (ref 8.9–10.3)
Chloride: 105 mmol/L (ref 98–111)
Creatinine, Ser: 1.53 mg/dL — ABNORMAL HIGH (ref 0.44–1.00)
GFR calc Af Amer: 36 mL/min — ABNORMAL LOW (ref >60)
GFR calc non Af Amer: 31 mL/min — ABNORMAL LOW (ref >60)
Glucose, Bld: 89 mg/dL (ref 70–99)
Potassium: 3.9 mmol/L (ref 3.5–5.1)
Sodium: 141 mmol/L (ref 135–145)

## 2020-08-19 LAB — CBC WITH DIFFERENTIAL/PLATELET
Abs Immature Granulocytes: 0.02 10*3/uL (ref 0.00–0.07)
Basophils Absolute: 0 10*3/uL (ref 0.0–0.1)
Basophils Relative: 0 %
Eosinophils Absolute: 0.2 10*3/uL (ref 0.0–0.5)
Eosinophils Relative: 4 %
HCT: 25.7 % — ABNORMAL LOW (ref 36.0–46.0)
Hemoglobin: 8.3 g/dL — ABNORMAL LOW (ref 12.0–15.0)
Immature Granulocytes: 0 %
Lymphocytes Relative: 14 %
Lymphs Abs: 0.6 10*3/uL — ABNORMAL LOW (ref 0.7–4.0)
MCH: 28.4 pg (ref 26.0–34.0)
MCHC: 32.3 g/dL (ref 30.0–36.0)
MCV: 88 fL (ref 80.0–100.0)
Monocytes Absolute: 0.5 10*3/uL (ref 0.1–1.0)
Monocytes Relative: 12 %
Neutro Abs: 3.3 10*3/uL (ref 1.7–7.7)
Neutrophils Relative %: 70 %
Platelets: 317 10*3/uL (ref 150–400)
RBC: 2.92 MIL/uL — ABNORMAL LOW (ref 3.87–5.11)
RDW: 17.8 % — ABNORMAL HIGH (ref 11.5–15.5)
WBC: 4.7 10*3/uL (ref 4.0–10.5)
nRBC: 0 % (ref 0.0–0.2)

## 2020-08-19 LAB — GLUCOSE, CAPILLARY
Glucose-Capillary: 86 mg/dL (ref 70–99)
Glucose-Capillary: 91 mg/dL (ref 70–99)
Glucose-Capillary: 79 mg/dL (ref 70–99)
Glucose-Capillary: 106 mg/dL — ABNORMAL HIGH (ref 70–99)

## 2020-08-19 NOTE — Progress Notes (Signed)
Occupational Therapy Session Note  Patient Details  Name: Shawna Curry MRN: [id number] Date of Birth: 1933-08-02  Today's Date: 08/19/2020 OT Individual Time: 1300-1415 OT Individual Time Calculation (min): 75 min    Short Term Goals: Week 3:  OT Short Term Goal 1 (Week 3): STGs = upgraded LTGs  Skilled Therapeutic Interventions/Progress Updates:  Patient met seated in wc in agreement with OT treatment session with focus on self-care re-education, functional tranfers, and RUE AROM as detailed below. Patient indicated need to void bladder requiring CGA for functional mobility with RW to commode in bathroom. Toilet transfer with CGA. Patient with some bladder incontinence prior to reaching commode but able to complete void seated on BSC over standard toilet. Min A for hygiene/clothing management. Walk-in shower transfer with CGA and cues for posture and hand/foot placement. Patient able to wash UB with supervision A and cues for thoroughness and memory as patient was unable to recall if she'd washed body parts yet. LB bathing with Min A seated on TTB. Patient able to thread BUE through shirt and sweater but required assist to pull over head. Patient also required assist for initial threading of BLE into underwear and pants with Min A to hike over hips in standing. Seated at sink level, patient able to comb hair and brush teeth with set-up assist. Total A for wc transport to dayroom for time management and energy conservation. Patient engaged in 15 min there ex on NuStep with focus on fluid and coordinated movement. Session concluded with patient seated in wc with call bell within reach, daughter present at bedside, and all needs met.    Therapy Documentation Precautions:  Precautions Precautions: Fall Precaution Comments: truncal and bil UE ataxia, severely impaired motor planning Restrictions Weight Bearing Restrictions: No General:    Therapy/Group: Individual Therapy  Zaydin Billey R  [place] 08/19/2020, 12:28 PM

## 2020-08-19 NOTE — Discharge Summary (Addendum)
Physician Discharge Summary  Patient ID: Shawna Curry MRN: [id number] DOB/AGE: [birth year] 86 y.o.  Admit date: 08/02/2020 Discharge date: 08/21/2020  Discharge Diagnoses:  Principal Problem:   Right sided cerebral infarction [facility]) Active Problems:   Anemia   Labile blood glucose   Dysphagia, post-stroke   PAF (paroxysmal atrial fibrillation) (HCC)   Hyperkalemia DVT prophylaxis CAD/non-STEMI Diabetes mellitus Acute on chronic systolic congestive heart failure Hyperlipidemia AKI  Discharged Condition: Stable  Significant Diagnostic Studies: DG Chest 2 View  Result Date: 07/27/2020 CLINICAL DATA:  Shortness of breath for several days. EXAM: CHEST - 2 VIEW COMPARISON:  CT chest and PA and lateral chest 07/25/2020. FINDINGS: Very small bilateral pleural effusions are again seen. There is cardiomegaly. Aortic atherosclerosis. Lungs are emphysematous but clear. No acute bony abnormality. Remote left sixth rib fracture noted. IMPRESSION: No change in small bilateral pleural effusions and mild basilar atelectasis. Cardiomegaly without edema. Aortic Atherosclerosis (ICD10-I70.0) and Emphysema (ICD10-J43.9). Electronically Signed   By: Inge Rise M.D.   On: 07/27/2020 12:45   DG Chest 2 View  Result Date: 07/25/2020 CLINICAL DATA:  Shortness of breath.  Right chest pain. EXAM: CHEST - 2 VIEW COMPARISON:  07/10/2020 FINDINGS: Enlarged cardiac silhouette. Aortic atherosclerotic calcification. Pulmonary venous hypertension without frank edema. Small effusions layering dependently, larger than were seen 2 weeks ago. Ordinary chronic degenerative changes affect the shoulders. IMPRESSION: Enlarged cardiac silhouette. Bilateral pleural effusions in the dependent pleural space, right larger than left, and slightly larger than 2 weeks ago. Electronically Signed   By: Nelson Chimes M.D.   On: 07/25/2020 12:29   CT HEAD WO CONTRAST  Result Date: 07/28/2020 CLINICAL DATA:  Encephalopathy EXAM: CT  HEAD WITHOUT CONTRAST TECHNIQUE: Contiguous axial images were obtained from the base of the skull through the vertex without intravenous contrast. COMPARISON:  02/18/2020 FINDINGS: Brain: There is no mass, hemorrhage or extra-axial collection. The size and configuration of the ventricles and extra-axial CSF spaces are normal. There is hypoattenuation of the white matter, most commonly indicating chronic small vessel disease. Vascular: No abnormal hyperdensity of the major intracranial arteries or dural venous sinuses. No intracranial atherosclerosis. Skull: The visualized skull base, calvarium and extracranial soft tissues are normal. Sinuses/Orbits: No fluid levels or advanced mucosal thickening of the visualized paranasal sinuses. No mastoid or middle ear effusion. The orbits are normal. IMPRESSION: Chronic small vessel disease without acute intracranial abnormality. Electronically Signed   By: Ulyses Jarred M.D.   On: 07/28/2020 02:39   CT Chest Wo Contrast  Result Date: 07/25/2020 CLINICAL DATA:  Shortness of breath and weakness. EXAM: CT CHEST WITHOUT CONTRAST TECHNIQUE: Multidetector CT imaging of the chest was performed following the standard protocol without IV contrast. COMPARISON:  Apr 22, 2020 FINDINGS: Cardiovascular: There is marked severity calcification of the aortic arch. There is mild cardiomegaly. No pericardial effusion. Marked severity coronary artery calcification is noted Mediastinum/Nodes: No enlarged mediastinal or axillary lymph nodes. Thyroid gland, trachea, and esophagus demonstrate no significant findings. Lungs/Pleura: There is mild biapical scarring and/or atelectasis. Mild posterior right basilar atelectasis and/or infiltrate is seen. There is a small right pleural effusion. No pneumothorax is identified. Upper Abdomen: No acute abnormality. Musculoskeletal: Chronic second, third, fourth, fifth, 6 and seventh anterior right rib fractures are seen additional chronic anterior third,  fourth and fifth left rib fractures are noted. Multilevel degenerative changes seen throughout the thoracic spine. IMPRESSION: 1. Mild posterior right basilar atelectasis and/or infiltrate. 2. Small right pleural effusion. 3. Marked severity coronary artery calcification. 4.  Chronic bilateral rib fractures. 5. Aortic atherosclerosis. Aortic Atherosclerosis (ICD10-I70.0). Electronically Signed   By: Virgina Norfolk M.D.   On: 07/25/2020 16:10   MR BRAIN WO CONTRAST  Result Date: 07/30/2020 CLINICAL DATA:  Cardiac arrest. History of atrial fibrillation and stroke. EXAM: MRI HEAD WITHOUT CONTRAST TECHNIQUE: Multiplanar, multiecho pulse sequences of the brain and surrounding structures were obtained without intravenous contrast. COMPARISON:  Head CT 07/28/2020 and MRI 02/19/2020 FINDINGS: The study could not be completed due to the patient's condition. Axial and coronal diffusion, axial T2* gradient echo, axial FLAIR, and coronal T2 sequences were obtained and are motion degraded including severe motion on the gradient echo sequence. Brain: There is a small acute cortical and subcortical infarct in the posterior right cerebral hemisphere near the junction of the temporal, parietal, and occipital lobes (posterior MCA or border zone). No gross intracranial hemorrhage, mass/mass effect, or extra-axial fluid collection is identified. There is a chronic lacunar infarct at the lateral aspect of the left thalamus. T2 hyperintensities in the cerebral white matter bilaterally are similar to the prior MRI and are nonspecific but compatible with mild-to-moderate chronic small vessel ischemic disease. There is mild cerebral atrophy. A chronic lacunar infarct or cyst in the left subinsular region is unchanged. Mild cerebral atrophy is not considered abnormal for age. Vascular: Limited assessment on this motion degraded, incomplete examination. Skull and upper cervical spine: No gross destructive skull lesion. Sinuses/Orbits:  No acute findings. Other: None. IMPRESSION: 1. Motion degraded, incomplete examination. 2. Small acute posterior right cerebral infarct. 3. Mild-to-moderate chronic small vessel ischemic disease. Electronically Signed   By: Logan Bores M.D.   On: 07/30/2020 15:16   US RENAL  Result Date: 07/29/2020 CLINICAL DATA:  Acute kidney injury. EXAM: RENAL / URINARY TRACT ULTRASOUND COMPLETE COMPARISON:  February 17, 2020. FINDINGS: Right Kidney: Renal measurements: 7.9 x 4.8 x 4.4 cm = volume: 87 mL. Increased echogenicity of renal parenchyma is noted. 1.1 cm simple cyst is noted in lower pole. No mass or hydronephrosis visualized. Left Kidney: Renal measurements: 9.3 x 4.4 x 4.2 cm = volume: 88 mL. Increased echogenicity of renal parenchyma is noted. No mass or hydronephrosis visualized. Bladder: Decompressed secondary to Foley catheter. Other: Minimal ascites is noted in the right upper quadrant. IMPRESSION: Increased echogenicity of renal parenchyma is noted bilaterally suggesting medical renal disease. Mild right renal atrophy is noted. No hydronephrosis or renal obstruction is noted. Electronically Signed   By: Marijo Conception M.D.   On: 07/29/2020 12:29   DG Chest Port 1 View  Result Date: 07/31/2020 CLINICAL DATA:  Respiratory failure. EXAM: PORTABLE CHEST 1 VIEW COMPARISON:  July 29, 2020. FINDINGS: Stable cardiomegaly. No pneumothorax is noted. Endotracheal and nasogastric tubes have been removed. Mild bibasilar subsegmental atelectasis is noted. Small right pleural effusion is noted. Degenerative changes are seen involving both glenohumeral joints. IMPRESSION: Mild bibasilar subsegmental atelectasis. Small right pleural effusion. Endotracheal and nasogastric tubes have been removed. Aortic Atherosclerosis (ICD10-I70.0). Electronically Signed   By: Marijo Conception M.D.   On: 07/31/2020 08:27   DG Chest Port 1 View  Result Date: 07/29/2020 CLINICAL DATA:  Hypoxia EXAM: PORTABLE CHEST 1 VIEW COMPARISON:   July 27, 2020 FINDINGS: Endotracheal tube tip is 3.3 cm above the carina. Nasogastric tube tip and side port are below the diaphragm. No pneumothorax. There is airspace consolidation in the left lower lobe with minimal left pleural effusion. There is mild atelectatic change in the left upper lobe. Lungs otherwise are clear. There  is cardiomegaly with pulmonary vascularity normal. No adenopathy. There is aortic atherosclerosis. Bones are osteoporotic with arthropathy in each shoulder. IMPRESSION: Tube positions as described without pneumothorax. Airspace opacity left lower lung region, likely representing pneumonia or aspiration. Minimal left pleural effusion. Atelectatic change left upper lobe. A small amount of pneumonia in the left upper lobe is possible. Stable cardiomegaly. Aortic Atherosclerosis (ICD10-I70.0). Electronically Signed   By: Lowella Grip III M.D.   On: 07/29/2020 11:07   DG CHEST PORT 1 VIEW  Result Date: 07/27/2020 CLINICAL DATA:  Intubated, history of cardiac event EXAM: PORTABLE CHEST 1 VIEW COMPARISON:  07/27/2020 at 6:09 p.m. FINDINGS: Supine frontal view of the chest was obtained at 9:23 p.m. Endotracheal tube overlies tracheal air column tip at level of thoracic inlet. Enteric catheter passes below diaphragm tip excluded by collimation. Cardiac silhouette is enlarged. There are developing bibasilar veiling opacities, right greater than left. Stable central vascular congestion. No pneumothorax. IMPRESSION: 1. Support devices as above. 2. Bibasilar lung consolidation and effusions, right greater than left. This has progressed since prior study. 3. Stable central vascular congestion. Electronically Signed   By: Randa Ngo M.D.   On: 07/27/2020 21:32   DG Chest Portable 1 View  Result Date: 07/27/2020 CLINICAL DATA:  Intubated after cardiac event, unresponsive EXAM: PORTABLE CHEST 1 VIEW COMPARISON:  07/27/2020 at 12:20 p.m. FINDINGS: Single frontal view of the chest  demonstrates endotracheal tube overlying tracheal air column tip just below thoracic inlet. Enteric catheter passes below diaphragm tip excluded by collimation. External defibrillator pads overlie the cardiac silhouette, which is enlarged. Trace left pleural effusion is unchanged. There is central vascular congestion. No pneumothorax. No acute bony abnormalities. IMPRESSION: 1. Support devices as above. 2. Central vascular congestion, with stable trace left pleural effusion. Electronically Signed   By: Randa Ngo M.D.   On: 07/27/2020 18:37   DG Foot Complete Right  Result Date: 07/27/2020 CLINICAL DATA:  Plantar ulceration, right foot pain, decreased pulses EXAM: RIGHT FOOT COMPLETE - 3+ VIEW COMPARISON:  02/26/2020 FINDINGS: Frontal, oblique, and lateral views of the right foot are obtained. Bones remain osteopenic. Prior resection or resorption of the fifth middle phalanx unchanged. There are no acute or destructive bony lesions. Specifically, no radiographic evidence of osteomyelitis. Prominent calcaneal spurs are stable. Diffuse vascular calcifications are noted. IMPRESSION: 1. No acute or destructive bony lesion. Electronically Signed   By: Randa Ngo M.D.   On: 07/27/2020 15:13   EEG adult  Result Date: 07/28/2020 Greta Doom, MD     07/28/2020 12:52 PM History: 86 year old female status post cardiac arrest and now intubated. Sedation: None Technique: This is a 21 channel routine scalp EEG performed at the bedside with bipolar and monopolar montages arranged in accordance to the international 10/20 system of electrode placement. One channel was dedicated to EKG recording. Background: The background consists predominantly of generalized irregular delta and theta range activities.  There is a posterior dominant rhythm which attenuates with eye opening but is poorly sustained, with a frequency of 9 Hz.  No epileptiform discharges or seizures were seen on the study. Photic stimulation:  Physiologic driving is not performed EEG Abnormalities: 1) generalized irregular slow activity Clinical Interpretation: This  EEG is consistent with a nonspecific generalized cerebral dysfunction (encephalopathy).  There was no seizure or seizure predisposition recorded on this study. Please note that lack of epileptiform activity on EEG does not preclude the possibility of epilepsy. Roland Rack, MD [facility] [phone] If 7pm- 7am, please  page neurology on call as listed in [place].   ECHOCARDIOGRAM COMPLETE  Result Date: 07/28/2020    ECHOCARDIOGRAM REPORT   Patient Name:   Shawna Curry Date of Exam: 07/28/2020 Medical Rec #:  [id number]     Height:       68.0 in Accession #:    [id number]    Weight:       114.6 lb Date of Birth:  [birth year]     BSA:          1.613 m Patient Age:    10 years      BP:           133/117 mmHg Patient Gender: F             HR:           106 bpm. Exam Location:  Inpatient Procedure: 2D Echo, Cardiac Doppler and Color Doppler Indications:    Cardiac arrest  History:        Patient has no prior history of Echocardiogram examinations.                 CAD, Arrythmias:Atrial Fibrillation; Risk Factors:Hypertension                 and Dyslipidemia. Sepsis. Respiratory failure. HFpEF. PVD.  Sonographer:    Clayton Lefort RDCS (AE) Referring Phys: [id number] [place]  1. LVEF ~ 40% with diffuse hypokinesis, moderate RV systolic dysfunction. Severe mitral regurgitation, RVSP 56 mmHg.  2. Left ventricular ejection fraction, by estimation, is 40 to 45%. The left ventricle has mildly decreased function. The left ventricle demonstrates global hypokinesis. There is moderate concentric left ventricular hypertrophy. Left ventricular diastolic function could not be evaluated.  3. Right ventricular systolic function is moderately reduced. The right ventricular size is moderately enlarged. There is moderately elevated pulmonary artery systolic pressure. The estimated  right ventricular systolic pressure is 84.1 mmHg.  4. Left atrial size was severely dilated.  5. Right atrial size was moderately dilated.  6. Large left pleural effusion with lung atelectasis.  7. The mitral valve is normal in structure. Severe mitral valve regurgitation. No evidence of mitral stenosis.  8. Tricuspid valve regurgitation is moderate.  9. The aortic valve is normal in structure. Aortic valve regurgitation is trivial. Mild to moderate aortic valve sclerosis/calcification is present, without any evidence of aortic stenosis. 10. The inferior vena cava is normal in size with greater than 50% respiratory variability, suggesting right atrial pressure of 3 mmHg. FINDINGS  Left Ventricle: Left ventricular ejection fraction, by estimation, is 40 to 45%. The left ventricle has mildly decreased function. The left ventricle demonstrates global hypokinesis. The left ventricular internal cavity size was normal in size. There is  moderate concentric left ventricular hypertrophy. Left ventricular diastolic function could not be evaluated due to atrial fibrillation. Left ventricular diastolic function could not be evaluated. Right Ventricle: The right ventricular size is moderately enlarged. No increase in right ventricular wall thickness. Right ventricular systolic function is moderately reduced. There is moderately elevated pulmonary artery systolic pressure. The tricuspid  regurgitant velocity is 3.20 m/s, and with an assumed right atrial pressure of 15 mmHg, the estimated right ventricular systolic pressure is 66.0 mmHg. Left Atrium: Left atrial size was severely dilated. Right Atrium: Right atrial size was moderately dilated. Pericardium: Trivial pericardial effusion is present. Mitral Valve: The mitral valve is normal in structure. Normal mobility of the mitral valve leaflets. Severe mitral valve regurgitation, with posteriorly-directed jet. No evidence  of mitral valve stenosis. Tricuspid Valve: The tricuspid  valve is normal in structure. Tricuspid valve regurgitation is moderate . No evidence of tricuspid stenosis. Aortic Valve: The aortic valve is normal in structure. Aortic valve regurgitation is trivial. Mild to moderate aortic valve sclerosis/calcification is present, without any evidence of aortic stenosis. Aortic valve mean gradient measures 3.0 mmHg. Aortic valve peak gradient measures 6.0 mmHg. Aortic valve area, by VTI measures 1.36 cm. Pulmonic Valve: The pulmonic valve was normal in structure. Pulmonic valve regurgitation is not visualized. No evidence of pulmonic stenosis. Aorta: The aortic root is normal in size and structure. Venous: The inferior vena cava is normal in size with greater than 50% respiratory variability, suggesting right atrial pressure of 3 mmHg. IAS/Shunts: No atrial level shunt detected by color flow Doppler. Additional Comments: There is a large pleural effusion in the left lateral region.  LEFT VENTRICLE PLAX 2D LVIDd:         4.40 cm LVIDs:         3.40 cm LV PW:         1.20 cm LV IVS:        1.20 cm LVOT diam:     2.00 cm LV SV:         21 LV SV Index:   13 LVOT Area:     3.14 cm  LV Volumes (MOD) LV vol d, MOD A2C: 78.0 ml LV vol d, MOD A4C: 75.1 ml LV vol s, MOD A2C: 49.1 ml LV vol s, MOD A4C: 45.5 ml LV SV MOD A2C:     28.9 ml LV SV MOD A4C:     75.1 ml LV SV MOD BP:      29.7 ml RIGHT VENTRICLE          IVC RV Basal diam:  3.70 cm  IVC diam: 2.20 cm RV Mid diam:    2.70 cm TAPSE (M-mode): 1.3 cm LEFT ATRIUM              Index       RIGHT ATRIUM           Index LA diam:        4.50 cm  2.79 cm/m  RA Area:     24.70 cm LA Vol (A2C):   79.6 ml  49.34 ml/m RA Volume:   80.50 ml  49.90 ml/m LA Vol (A4C):   112.0 ml 69.42 ml/m LA Biplane Vol: 103.0 ml 63.85 ml/m  AORTIC VALVE AV Area (Vmax):    1.39 cm AV Area (Vmean):   1.33 cm AV Area (VTI):     1.36 cm AV Vmax:           122.40 cm/s AV Vmean:          82.200 cm/s AV VTI:            0.153 m AV Peak Grad:      6.0 mmHg AV  Mean Grad:      3.0 mmHg LVOT Vmax:         54.32 cm/s LVOT Vmean:        34.720 cm/s LVOT VTI:          0.066 m LVOT/AV VTI ratio: 0.43  AORTA Ao Root diam: 2.90 cm Ao Asc diam:  3.20 cm MR Peak grad:    119.2 mmHg  TRICUSPID VALVE MR Mean grad:    81.0 mmHg   TR Peak grad:   41.0 mmHg MR Vmax:  546.00 cm/s TR Vmax:        320.00 cm/s MR Vmean:        428.0 cm/s MR PISA:         3.08 cm    SHUNTS MR PISA Eff ROA: 22 mm      Systemic VTI:  0.07 m MR PISA Radius:  0.70 cm     Systemic Diam: 2.00 cm Ena Dawley MD Electronically signed by Ena Dawley MD Signature Date/Time: 07/28/2020/11:14:35 AM    Final    VAS US CAROTID  Result Date: 08/01/2020 Carotid Arterial Duplex Study Indications:       CVA. Comparison Study:  No prior study Performing Technologist: Maudry Mayhew MHA, RDMS, RVT, RDCS  Examination Guidelines: A complete evaluation includes B-mode imaging, spectral Doppler, color Doppler, and power Doppler as needed of all accessible portions of each vessel. Bilateral testing is considered an integral part of a complete examination. Limited examinations for reoccurring indications may be performed as noted.  Right Carotid Findings: +----------+--------+--------+--------+--------------------------+--------+           PSV cm/sEDV cm/sStenosisPlaque Description        Comments +----------+--------+--------+--------+--------------------------+--------+ CCA Prox  49      8                                                  +----------+--------+--------+--------+--------------------------+--------+ CCA Distal41      7               smooth and heterogenous            +----------+--------+--------+--------+--------------------------+--------+ ICA Prox  27      9               smooth and heterogenous            +----------+--------+--------+--------+--------------------------+--------+ ICA Distal68      15                                                  +----------+--------+--------+--------+--------------------------+--------+ ECA       69      6               heterogenous and irregular         +----------+--------+--------+--------+--------------------------+--------+ +----------+--------+-------+----------------+-------------------+           PSV cm/sEDV cmsDescribe        Arm Pressure (mmHG) +----------+--------+-------+----------------+-------------------+ [id number]             Multiphasic, WNL                    +----------+--------+-------+----------------+-------------------+ +---------+--------+--+--------+-+---------+ VertebralPSV cm/s31EDV cm/s7Antegrade +---------+--------+--+--------+-+---------+  Left Carotid Findings: +----------+-------+-------+--------+---------------------------------+--------+           PSV    EDV    StenosisPlaque Description               Comments           cm/s   cm/s                                                     +----------+-------+-------+--------+---------------------------------+--------+  CCA Prox  68     10                                                       +----------+-------+-------+--------+---------------------------------+--------+ CCA Distal63     13             smooth and heterogenous                   +----------+-------+-------+--------+---------------------------------+--------+ ICA Prox  177    51             heterogenous, irregular and                                               calcific                                  +----------+-------+-------+--------+---------------------------------+--------+ ICA Distal72     22                                                       +----------+-------+-------+--------+---------------------------------+--------+ ECA       67     10             calcific                                  +----------+-------+-------+--------+---------------------------------+--------+  +----------+--------+--------+----------------+-------------------+           PSV cm/sEDV cm/sDescribe        Arm Pressure (mmHG) +----------+--------+--------+----------------+-------------------+ [id number]             Multiphasic, WNL                    +----------+--------+--------+----------------+-------------------+ +---------+--------+--+--------+-+---------+ VertebralPSV cm/s48EDV cm/s7Antegrade +---------+--------+--+--------+-+---------+   Summary: Right Carotid: Velocities in the right ICA are consistent with a 1-39% stenosis. Left Carotid: Velocities in the left ICA are consistent with a 40-59% stenosis. Vertebrals:  Bilateral vertebral arteries demonstrate antegrade flow. Subclavians: Normal flow hemodynamics were seen in bilateral subclavian              arteries. *See table(s) above for measurements and observations.  Electronically signed by Monica Martinez MD on 08/01/2020 at 3:29:12 PM.    Final    ECHOCARDIOGRAM LIMITED  Result Date: 07/31/2020    ECHOCARDIOGRAM LIMITED REPORT   Patient Name:   Shawna Curry Date of Exam: 07/31/2020 Medical Rec #:  [id number]     Height:       68.0 in Accession #:    [id number]    Weight:       112.7 lb Date of Birth:  1933/06/07     BSA:          1.601 m Patient Age:    40 years      BP:           106/67 mmHg Patient Gender: F  HR:           50 bpm. Exam Location:  Inpatient Procedure: Limited Echo, Cardiac Doppler and Color Doppler Indications:    Mitral valve insufficiency  History:        Patient has prior history of Echocardiogram examinations, most                 recent 07/28/2020. Risk Factors:Former Smoker. Sepsis.                 Respiratory failure. Acute systolic heart failure.  Sonographer:    Clayton Lefort RDCS (AE) Referring Phys: [id number] [place]  1. The PMVL is restricted in systole (IIIB pathology). The regurgitation jet is central (A2-P2) and directed centrally/posteriorly. The mitral valve is  degenerative. Severe mitral valve regurgitation. No evidence of mitral stenosis.  2. Left ventricular ejection fraction, by estimation, is 35 to 40%. The left ventricle has moderately decreased function. The left ventricle demonstrates global hypokinesis. There is mild concentric left ventricular hypertrophy. There is the interventricular septum is flattened in systole and diastole, consistent with right ventricular pressure and volume overload.  3. Right ventricular systolic function is moderately reduced. The right ventricular size is moderately enlarged. There is moderately elevated pulmonary artery systolic pressure. The estimated right ventricular systolic pressure is 82.4 mmHg.  4. Left atrial size was severely dilated.  5. Right atrial size was severely dilated.  6. Tricuspid valve regurgitation is moderate to severe.  7. The aortic valve is tricuspid. Aortic valve regurgitation is not visualized. Mild aortic valve stenosis.  8. The inferior vena cava is dilated in size with <50% respiratory variability, suggesting right atrial pressure of 15 mmHg. Comparison(s): No significant change from prior study. MR remains severe. Moderate to severe TR. RVSP improved. FINDINGS  Left Ventricle: Left ventricular ejection fraction, by estimation, is 35 to 40%. The left ventricle has moderately decreased function. The left ventricle demonstrates global hypokinesis. The left ventricular internal cavity size was normal in size. There is mild concentric left ventricular hypertrophy. The interventricular septum is flattened in systole and diastole, consistent with right ventricular pressure and volume overload. Right Ventricle: The right ventricular size is moderately enlarged. No increase in right ventricular wall thickness. Right ventricular systolic function is moderately reduced. There is moderately elevated pulmonary artery systolic pressure. The tricuspid  regurgitant velocity is 2.79 m/s, and with an assumed right  atrial pressure of 15 mmHg, the estimated right ventricular systolic pressure is 23.5 mmHg. Left Atrium: Left atrial size was severely dilated. Right Atrium: Right atrial size was severely dilated. Pericardium: Trivial pericardial effusion is present. Mitral Valve: The PMVL is restricted in systole (IIIB pathology). The regurgitation jet is central (A2-P2) and directed centrally/posteriorly. The mitral valve is degenerative in appearance. There is mild calcification of the anterior and posterior mitral valve leaflet(s). Severe mitral valve regurgitation. No evidence of mitral valve stenosis. Tricuspid Valve: The tricuspid valve is grossly normal. Tricuspid valve regurgitation is moderate to severe. Aortic Valve: The aortic valve is tricuspid. . There is moderate thickening and moderate calcification of the aortic valve. Aortic valve regurgitation is not visualized. Mild aortic stenosis is present. There is moderate thickening of the aortic valve. There is moderate calcification of the aortic valve. Pulmonic Valve: The pulmonic valve was grossly normal. Pulmonic valve regurgitation is trivial. No evidence of pulmonic stenosis. Venous: The inferior vena cava is dilated in size with less than 50% respiratory variability, suggesting right atrial pressure of 15 mmHg. Additional Comments: There  is a small pleural effusion in the left lateral region. LEFT VENTRICLE PLAX 2D LVIDd:         4.60 cm LVIDs:         3.10 cm LV PW:         1.10 cm LV IVS:        1.40 cm LVOT diam:     2.00 cm LVOT Area:     3.14 cm  LV Volumes (MOD) LV vol d, MOD A2C: 62.7 ml LV vol d, MOD A4C: 83.7 ml LV vol s, MOD A2C: 33.8 ml LV vol s, MOD A4C: 45.1 ml LV SV MOD A2C:     28.9 ml LV SV MOD A4C:     83.7 ml LV SV MOD BP:      32.1 ml IVC IVC diam: 2.30 cm LEFT ATRIUM         Index LA diam:    4.00 cm 2.50 cm/m   AORTA Ao Root diam: 3.00 cm MR Peak grad:    88.4 mmHg   TRICUSPID VALVE MR Mean grad:    51.0 mmHg   TR Peak grad:   31.1 mmHg MR  Vmax:         470.00 cm/s TR Vmax:        279.00 cm/s MR Vmean:        329.0 cm/s MR PISA:         4.02 cm    SHUNTS MR PISA Eff ROA: 34 mm      Systemic Diam: 2.00 cm MR PISA Radius:  0.80 cm Eleonore Chiquito MD Electronically signed by Eleonore Chiquito MD Signature Date/Time: 07/31/2020/11:26:10 AM    Final     Labs:  Basic Metabolic Panel: Recent Labs  Lab 08/19/20 0618 08/20/20 0753  NA 141 139  K 3.9 5.6*  CL 105 103  CO2 27 19*  GLUCOSE 89 130*  BUN 29* 35*  CREATININE 1.53* 1.38*  CALCIUM 9.0 9.2    CBC: Recent Labs  Lab 08/19/20 0618 08/20/20 0753  WBC 4.7 5.4  NEUTROABS 3.3  --   HGB 8.3* 9.9*  HCT 25.7* 31.4*  MCV 88.0 89.0  PLT 317 339    CBG: Recent Labs  Lab 08/19/20 2126 08/20/20 0616 08/20/20 1150 08/20/20 1647 08/20/20 2057  GLUCAP 106* 94 77 120* 108*   Family history.  Mother with diabetes as well as hypertension.  Denies any esophageal cancer rectal cancer or colon cancer  Brief HPI:   Shawna Curry is a 86 y.o. right-handed female with history of atrial fibrillation maintained on Eliquis as well as Plavix, hyperlipidemia, DVT of tibial vein, diabetes mellitus, PCA infarction with residual right-sided weakness, CAD with non-STEMI March 2021 followed at [facility], history of right CEA left carotid stenosis followed by vascular surgery as well as acute on chronic systolic congestive heart failure.  Patient lives alone 1 level home ramped entrance.  Ambulates with a rolling walker.  Good family support.  Earlier this year patient did go to the hospital on multiple occasions short stay in skilled nursing facility was receiving first outpatient therapy and then home health therapies.  Presented 07/25/2020 with shortness of breath.  Noted to be hypotensive became unresponsive in the ED requiring intubation/PEA arrest 5 minutes CPR given epinephrine bicarb also given.  EKG showed interval T waves in V5-V6 with 1 mm of elevation in V4 and placed on  intravenous amiodarone as well as IV heparin.  EEG negative for seizure  nonspecific generalized cerebral dysfunction encephalopathy no seizure activity.  Carotid Dopplers with 40 to 59% left ICA stenosis.  Admission chemistries potassium 5.2 BUN 51 creatinine 1.90 troponin XX 6-29 lactic acid 2.8 BNP [id number].  Initial echocardiogram with ejection fraction of 40 to 45%.  The left ventricle demonstrating global hypokinesis and echocardiogram repeated 07/31/2020 showing ejection fraction 35 to 40% mild concentric left ventricular hypertrophy moderately elevated pulmonary artery systolic pressure.  She was extubated 07/29/2020 aspirin initiated 07/30/2020.  Close monitoring of renal function elevated 2.31 improved with gentle IV hydration latest creatinine 1.75.  MRI of the brain completed 07/30/2020 after extubation showed small acute posterior right cerebral infarction advised to resume home Eliquis per neurology services and initial aspirin was then discontinued.  Patient was on a mechanical soft diet.  Therapy evaluations completed and patient was admitted for a comprehensive rehab program   Hospital Course: Emmanuelle Hibbitts was admitted to rehab 08/02/2020 for inpatient therapies to consist of PT, ST and OT at least three hours five days a week. Past admission physiatrist, therapy team and rehab RN have worked together to provide customized collaborative inpatient rehab.  Pertaining to patient's right cerebral infarction near the junction of the temporal parietal occipital lobes after PEA arrest as well as history of PCA infarction March 2021 residual right-sided weakness.  She continued on Eliquis therapy.  No bleeding episodes.  Pain managed with use of scheduled Neurontin twice daily.  She did have a history of atrial fibrillation amiodarone as advised as well as Toprol.  Acute on chronic diastolic congestive heart failure she remained on Lasix as directed monitoring for any signs of fluid overload.  She would  follow-up outpatient cardiology services.  She had no chest pain or shortness of breath.  Diabetes mellitus hemoglobin A1c 6.2 low-dose Glucophage as advised had been resumed with close monitoring of renal function with latest creatinine 1.38.  Patient with noted hyperkalemia 5.6 08/20/2020 and did receive Lokelma x1 with potassium supplement discontinued.  Lipitor for hyperlipidemia.  She continued on mechanical soft diet.   Blood pressures were monitored on TID basis and soft and monitored  Diabetes has been monitored with ac/hs CBG checks and SSI was use prn for tighter BS control.    Rehab course: During patient's stay in rehab weekly team conferences were held to monitor patient's progress, set goals and discuss barriers to discharge. At admission, patient required minimal assist sit to supine moderate assist general bed mobility  Physical exam.  Blood pressure 131/69 pulse 62 temperature 98.6 respirations 18 oxygen saturation 98% room air Constitutional.  No acute distress HEENT Head.  Normocephalic and atraumatic Eyes.  Pupils round and reactive to light no discharge without nystagmus Neck.  Supple nontender no JVD without thyromegaly Cardiac regular rate rhythm without any extra sounds or murmur heard Abdomen.  Soft nontender positive bowel sounds without rebound Respiratory effort normal no respiratory distress without wheeze Neurologic.  Alert oriented to name but not year.  She did have some difficulty naming objects. Motor.  Right upper extremity 3/5 proximal distally Left upper extremity 4+/5 proximal distally Bilateral lower extremities 4+/5 distally   /She  has had improvement in activity tolerance, balance, postural control as well as ability to compensate for deficits. Shawna Curry has had improvement in functional use RUE/LUE  and RLE/LLE as well as improvement in awareness.  Supine to sit with contact-guard assist perform sit to stand transfers edge of bed rolling walker minimal  assist ambulates to the bathroom contact-guard rolling walker.  While standing she was able to doff lower body dressing and bilateral upper extremities while therapist provided minimal assist due to posterior bias.  She can ambulate up to 100 feet x 2 contact-guard assist.  Minimal assist grooming contact-guard lower body bathing minimal assist upper body dressing moderate assist lower body dressing patient required moderate verbal cues and visual cues for air awareness and problem solving.  She needed assistance with recall and carryover of strategies.  All issues in regards to this were discussed with patient and family for ongoing assistance for safety full family teaching completed and discharged to home       Disposition: Discharged to home    Diet: Mechanical soft diabetic diet  Special Instructions: No driving smoking or alcohol  Santyl ointment applied to right foot daily  Patient should have follow-up chemistries to monitor renal function as well as potassium levels while maintained on diuretic  Medications at discharge 1.  Tylenol as needed 2.  Amiodarone 200 mg p.o. daily 3.  Eliquis 2.5 mg p.o. twice daily 4.  Lipitor 40 mg p.o. daily 5.  Colace 100 mg p.o. twice daily 6.  Lasix 40 mg p.o. daily 7.  Neurontin 300 mg p.o. twice daily 8.Xalatan ophthalmic solution 0.05% 1 drop both eyes bedtime 9.  Glucophage 500 mg p.o. daily 10.  Toprol-XL 25 mg p.o. daily   30-35 minutes were spent completing discharge summary and discharge planning  Discharge Instructions     Ambulatory referral to Neurology   Complete by: As directed    An appointment is requested in approximately 4 weeks right cerebral infarction   Ambulatory referral to Physical Medicine Rehab   Complete by: As directed    Moderate complexity follow-up 1 to 2 weeks right cerebral infarction        Follow-up Information     Jamse Arn, MD Follow up.   Specialty: Physical Medicine and  Rehabilitation Why: Office to call for appointment Contact information: [address] [place] [address] [phone]         Denton Brick., MD Follow up.   Specialty: Cardiology Why: Call for appointment Contact information: [address] [place] [address] [phone]         Clovia Cuff, MD Follow up on 08/22/2020.   Specialty: Internal Medicine Why: Appointment 9:00-11:00 am New PCP Contact information: [place] [address] [phone]                 Signed: Cathlyn Parsons 08/21/2020, 5:04 AM Patient was seen, face-face, and physical exam performed by me on day of discharge, greater than 30 minutes of total time spent.. Please see progress note from day of discharge as well.  Delice Lesch, MD, ABPMR

## 2020-08-19 NOTE — Progress Notes (Signed)
Speech Language Pathology Daily Session Note  Patient Details  Name: Andreika Vandagriff MRN: [id number] Date of Birth: Apr 14, 1933  Today's Date: 08/19/2020 SLP Individual Time: 0900-1000 SLP Individual Time Calculation (min): 60 min  Short Term Goals: Week 3: SLP Short Term Goal 1 (Week 3): STG=LTG due to short ELOS  Skilled Therapeutic Interventions:Skilled ST services focused on cognitive skills. SLP facilitated orientation and delayed recall with visual aids posted in room, pt was orientated to place, increased orientation of time throughout session but continued disorientation to situation. Pt was able to recall/ read events from yesterday in memory notebook with min A verbal cues for notebook and mod A verbal cues for decoding. SLP also facilitated sustained attention, basic problem solving and error awareness skills in novel 4 step card sequencing tasks, pt required mod A fade to min A verbal cues. Pt demonstrated sustained attention in 20 minute intervals with min A verbal cues for redirection to task. Pt demonstrated increase safety awareness, identifying unsafe situations among 2 cards and provided appropriate solutions with min A verbal cues. Pt was left in room with call bell within reach and bed/chair alarm set. SLP recommends to continue skilled services.     Pain Pain Assessment Pain Score: 0-No pain  Therapy/Group: Individual Therapy  Brunette Lavalle  [facility] 08/19/2020, 2:27 PM

## 2020-08-19 NOTE — Progress Notes (Signed)
Physical Therapy Session Note  Patient Details  Name: Shawna Curry MRN: [id number] Date of Birth: 11/18/1933  Today's Date: 08/19/2020 PT Individual Time: 1030-1145 PT Individual Time Calculation (min): 75 min   Short Term Goals: Week 2:  PT Short Term Goal 1 (Week 2): Pt will maintain sitting EOB with no more than minA and no BUE support PT Short Term Goal 2 (Week 2): Pt will consistently perform bed<>chair transfers with modA and LRAD PT Short Term Goal 3 (Week 2): Pt will self propel manual w/c 69ft with minA PT Short Term Goal 4 (Week 2): Pt will ambulate 58ft with modA and LRAD  Skilled Therapeutic Interventions/Progress Updates:    Pt received supine in bed, awake and agreeable to PT session. Pt reports unrated R foot pain, rest breaks and mobility provided for pain management. Donned pants while supine with modA with VC for using BUE's to assist pull up over bottom via bridging technique. Supine<>sit with supervision with HOB slightly elevated; able to initiate scooting towards EOB without cues and from there performed stand<>pivot transfer with minA and RW from EOB to w/c. Donned shoes with totalA while seated in w/c for time management. WC transport to hallway outside ADL room where she ambulated ~36ft with CGA and RW to ADL room bed, performed stand<>sit with CGA and RW and sit>supine with supervision on regular bed. Performed rolling L<>R in flat bed with supervision and supine<>sit on flat bed with supervision. Sit<>stand from regular bed with supervision and RW and ambulated to ADL bathroom where 2" threshold entrance was setup and practiced stepping forward x2 bouts over threshold with CGA and RW. Once in the bathroom, she voiced urgent need to void, ambulated to ADL toilet with CGA and RW and required modA for doffing pants and she was successful in voiding and able to perform pericare with setupA. She then ambulated outside of ADL room back to her w/c with CGA and RW. WC transport to  small therapy gym where she ambulated up/down ~37ft ramp with close supervision and RW and then performed car transfer with minA and RW. She required minA for sit<>stand to get out of car, with therapist cueing for hand placement and forward lean. After seated rest break, focused on stand<>pivot transfers where she performed a total of x8 with CGA and RW from w/c to/from low chair with arm rests. Therapist cueing for RW management, hand placement, general sequencing. Then, she ambulated ~162ft from small therapy gym back towards her room with CGA and RW, cues for erect posture, proximity to AD, and increasing B step length. WC transport back to her room where she ended session seated in w/c, daughter at bedside, needs in reach.  Therapy Documentation Precautions:  Precautions Precautions: Fall Precaution Comments: truncal and bil UE ataxia, severely impaired motor planning Restrictions Weight Bearing Restrictions: No  Therapy/Group: Individual Therapy  Merril Isakson P Coyle Stordahl  PT 08/19/2020, 11:51 AM

## 2020-08-19 NOTE — Progress Notes (Signed)
[facility] PHYSICAL MEDICINE & REHABILITATION PROGRESS NOTE   Subjective/Complaints: No complaints Working well with PT in hallway Hgb decreased Cr up  ROS: Denies CP, SOB, N/V/D  Objective:   No results found. Recent Labs    08/19/20 0618  WBC 4.7  HGB 8.3*  HCT 25.7*  PLT 317   Recent Labs    08/19/20 0618  NA 141  K 3.9  CL 105  CO2 27  GLUCOSE 89  BUN 29*  CREATININE 1.53*  CALCIUM 9.0    Intake/Output Summary (Last 24 hours) at 08/19/2020 1121 Last data filed at 08/18/2020 1831 Gross per 24 hour  Intake 720 ml  Output --  Net 720 ml     Physical Exam: Vital Signs Blood pressure 117/74, pulse 63, temperature (!) 97.5 F (36.4 C), temperature source Oral, resp. rate 14, height 5\' 8"  (1.727 m), weight 50.2 kg, SpO2 100 %.  General: Alert and oriented x 3, No apparent distress HEENT: Head is normocephalic, atraumatic, PERRLA, EOMI, sclera anicteric, oral mucosa pink and moist, dentition intact, ext ear canals clear,  Neck: Supple without JVD or lymphadenopathy Heart: Reg rate and rhythm. + Murmur. Chest: CTA bilaterally without wheezes, rales, or rhonchi; no distress Abdomen: Soft, non-tender, non-distended, bowel sounds positive. Extremities: No clubbing, cyanosis, or edema. Pulses are 2+ Skin: Warm and dry.  Intact. Psych: Pleasantly confused. Musc: No edema in extremities.  No tenderness in extremities. Neuro: Alert and oriented x1 Motor: RUE: 2/5 shoulder abduction, 4-4+/5 distally, stable LUE: 4+/5 proximal distal, unchanged  Bilateral lower extremities: 4+/5 distal, stable   Assessment/Plan: 1. Functional deficits secondary to right sided cerebral infarction which require 3+ hours per day of interdisciplinary therapy in a comprehensive inpatient rehab setting.  Physiatrist is providing close team supervision and 24 hour management of active medical problems listed below.  Physiatrist and rehab team continue to assess barriers to  discharge/monitor patient progress toward functional and medical goals  Care Tool:  Bathing    Body parts bathed by patient: Right arm, Left arm, Chest, Abdomen, Face, Right upper leg, Left upper leg, Front perineal area, Right lower leg, Left lower leg, Buttocks   Body parts bathed by helper: Right lower leg, Left lower leg, Buttocks Body parts n/a: Buttocks, Front perineal area   Bathing assist Assist Level: Contact Guard/Touching assist     Upper Body Dressing/Undressing Upper body dressing   What is the patient wearing?: Pull over shirt    Upper body assist Assist Level: Minimal Assistance - Patient > 75%    Lower Body Dressing/Undressing Lower body dressing      What is the patient wearing?: Incontinence brief, Pants     Lower body assist Assist for lower body dressing: Minimal Assistance - Patient > 75%     Toileting Toileting    Toileting assist Assist for toileting: Supervision/Verbal cueing     Transfers Chair/bed transfer  Transfers assist     Chair/bed transfer assist level: Moderate Assistance - Patient 50 - 74%     Locomotion Ambulation   Ambulation assist   Ambulation activity did not occur: Safety/medical concerns  Assist level: Minimal Assistance - Patient > 75% Assistive device: Parallel bars Max distance: 20'   Walk 10 feet activity   Assist  Walk 10 feet activity did not occur: Safety/medical concerns  Assist level: Minimal Assistance - Patient > 75% Assistive device: Parallel bars   Walk 50 feet activity   Assist Walk 50 feet with 2 turns activity did not occur:  Safety/medical concerns  Assist level: Minimal Assistance - Patient > 75% Assistive device: Walker-rolling    Walk 150 feet activity   Assist Walk 150 feet activity did not occur: Safety/medical concerns  Assist level: Minimal Assistance - Patient > 75% Assistive device: Walker-rolling    Walk 10 feet on uneven surface  activity   Assist Walk 10 feet  on uneven surfaces activity did not occur: Safety/medical concerns   Assist level: Minimal Assistance - Patient > 75% Assistive device: Aeronautical engineer Will patient use wheelchair at discharge?: No Type of Wheelchair: Manual    Wheelchair assist level: Maximal Assistance - Patient 25 - 49% Max wheelchair distance: 50    Wheelchair 50 feet with 2 turns activity    Assist        Assist Level: Maximal Assistance - Patient 25 - 49%   Wheelchair 150 feet activity     Assist      Assist Level: Dependent - Patient 0%   Blood pressure 117/74, pulse 63, temperature (!) 97.5 F (36.4 C), temperature source Oral, resp. rate 14, height 5\' 8"  (1.727 m), weight 50.2 kg, SpO2 100 %.  Medical Problem List and Plan: 1.  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  Antithrombotics: -DVT/anticoagulation: Intravenous heparin transitioned back to Eliquis             -antiplatelet therapy: N/A 3. Pain Management: Neurontin 300 mg twice daily and muscle rub crea. Has some right foot pain but overall well controlled.  Monitor with increased mobility 4. Mood: Provide emotional support             -antipsychotic agents: N/A 5. Neuropsych: This patient is not capable of making decisions on her own behalf. 6. Skin/Wound Care: Routine skin checks 7. Fluids/Electrolytes/Nutrition: Routine in and outs. 8.  Atrial fibrillation.  Amiodarone 200 mg twice daily, decreased to daily after discussion with pharmacy  Toprol-XL 25 mg daily. Will need outpatient f/u with cardiology.   Controlled 9/13             Monitor with increased exertion. 9.  CAD/non-STEMI.  Follow-up cardiology services 10.  Type 2 diabetes mellitus with hyperglycemia.  Hemoglobin A1c 6.2.  Patient on  Glucophage 500 mg twice daily prior to admission.    Resumed            Controlled 9/13  Monitor with increased mobility. 11.  Acute on chronic systolic congestive heart failure.  Lasix 40 mg daily.  Monitor for any signs of fluid overload             Daily weights. Filed Weights   08/16/20 0500 08/17/20 0500 08/19/20 0515  Weight: 52.9 kg 52.3 kg 50.2 kg   Decreased 9/13 12.  History of CEA as well as left carotid stenosis.  Follow-up vascular surgery 13.  Hyperlipidemia: Lipitor 14.  AKI.              Creatinine 1.41 on 9/7, up to 1.53 on 9/13. Nursing order placed to encourage hydration. Repeat tomorrow.   Encourage fluids 15.  Anemia  Hemoglobin 10.3 on 9/6, down to 8.3 9/13. Asymptomatic. Repeat tomorrow to trend.   Continue to monitor 16.  Post stroke dysphagia  D3 thins, advance diet as tolerated  LOS: 17 days A  FACE TO FACE EVALUATION WAS PERFORMED  Clide Deutscher Djibril Glogowski 08/19/2020, 11:21 AM

## 2020-08-20 ENCOUNTER — Inpatient Hospital Stay (HOSPITAL_COMMUNITY): Payer: Medicare HMO

## 2020-08-20 ENCOUNTER — Inpatient Hospital Stay (HOSPITAL_COMMUNITY): Payer: Medicare HMO | Admitting: Occupational Therapy

## 2020-08-20 DIAGNOSIS — E875 Hyperkalemia: Secondary | ICD-10-CM

## 2020-08-20 LAB — BASIC METABOLIC PANEL
Anion gap: 17 — ABNORMAL HIGH (ref 5–15)
BUN: 35 mg/dL — ABNORMAL HIGH (ref 8–23)
CO2: 19 mmol/L — ABNORMAL LOW (ref 22–32)
Calcium: 9.2 mg/dL (ref 8.9–10.3)
Chloride: 103 mmol/L (ref 98–111)
Creatinine, Ser: 1.38 mg/dL — ABNORMAL HIGH (ref 0.44–1.00)
GFR calc Af Amer: 40 mL/min — ABNORMAL LOW (ref >60)
GFR calc non Af Amer: 35 mL/min — ABNORMAL LOW (ref >60)
Glucose, Bld: 130 mg/dL — ABNORMAL HIGH (ref 70–99)
Potassium: 5.6 mmol/L — ABNORMAL HIGH (ref 3.5–5.1)
Sodium: 139 mmol/L (ref 135–145)

## 2020-08-20 LAB — CBC
HCT: 31.4 % — ABNORMAL LOW (ref 36.0–46.0)
Hemoglobin: 9.9 g/dL — ABNORMAL LOW (ref 12.0–15.0)
MCH: 28 pg (ref 26.0–34.0)
MCHC: 31.5 g/dL (ref 30.0–36.0)
MCV: 89 fL (ref 80.0–100.0)
Platelets: 339 10*3/uL (ref 150–400)
RBC: 3.53 MIL/uL — ABNORMAL LOW (ref 3.87–5.11)
RDW: 18.2 % — ABNORMAL HIGH (ref 11.5–15.5)
WBC: 5.4 10*3/uL (ref 4.0–10.5)
nRBC: 0 % (ref 0.0–0.2)

## 2020-08-20 LAB — GLUCOSE, CAPILLARY
Glucose-Capillary: 94 mg/dL (ref 70–99)
Glucose-Capillary: 77 mg/dL (ref 70–99)
Glucose-Capillary: 120 mg/dL — ABNORMAL HIGH (ref 70–99)
Glucose-Capillary: 108 mg/dL — ABNORMAL HIGH (ref 70–99)

## 2020-08-20 MED ORDER — DOCUSATE SODIUM 100 MG PO CAPS: 100.0000 mg | ORAL_CAPSULE | Freq: Two times a day (BID) | ORAL | 0 refills | Status: AC

## 2020-08-20 MED ORDER — FOLIC ACID 1 MG PO TABS: 1.0000 mg | ORAL_TABLET | Freq: Every day | ORAL | 0 refills | Status: AC

## 2020-08-20 MED ORDER — FUROSEMIDE 20 MG PO TABS: 40.0000 mg | ORAL_TABLET | Freq: Every day | ORAL | 0 refills | Status: AC

## 2020-08-20 MED ORDER — SODIUM ZIRCONIUM CYCLOSILICATE 5 G PO PACK
5.0000 g | PACK | Freq: Once | ORAL | Status: AC
  Administered 2020-08-20: 5 g via ORAL
  Filled 2020-08-20: qty 1

## 2020-08-20 MED ORDER — POTASSIUM CHLORIDE CRYS ER 10 MEQ PO TBCR: 10.0000 meq | EXTENDED_RELEASE_TABLET | Freq: Every day | ORAL | 0 refills | Status: DC

## 2020-08-20 MED ORDER — COLLAGENASE 250 UNIT/GM EX OINT: TOPICAL_OINTMENT | Freq: Every day | CUTANEOUS | 0 refills | Status: AC

## 2020-08-20 MED ORDER — GABAPENTIN 600 MG PO TABS: 300.0000 mg | ORAL_TABLET | Freq: Two times a day (BID) | ORAL | 0 refills | Status: AC

## 2020-08-20 MED ORDER — ACETAMINOPHEN 325 MG PO TABS: 650.0000 mg | ORAL_TABLET | Freq: Four times a day (QID) | ORAL | Status: AC | PRN

## 2020-08-20 MED ORDER — ATORVASTATIN CALCIUM 40 MG PO TABS: 40.0000 mg | ORAL_TABLET | Freq: Every evening | ORAL | 0 refills | Status: AC

## 2020-08-20 MED ORDER — METOPROLOL SUCCINATE ER 25 MG PO TB24: 25.0000 mg | ORAL_TABLET | Freq: Every day | ORAL | 0 refills | Status: AC

## 2020-08-20 MED ORDER — METFORMIN HCL 500 MG PO TABS: 500.0000 mg | ORAL_TABLET | Freq: Every day | ORAL | 0 refills | Status: AC

## 2020-08-20 MED ORDER — AMIODARONE HCL 200 MG PO TABS: 200.0000 mg | ORAL_TABLET | Freq: Every day | ORAL | 0 refills | Status: AC

## 2020-08-20 MED ORDER — APIXABAN 2.5 MG PO TABS: 2.5000 mg | ORAL_TABLET | Freq: Two times a day (BID) | ORAL | 0 refills | Status: AC

## 2020-08-20 MED FILL — POTASSIUM CHL ER M10 TABLET: 10 | 30 days supply | Qty: 30 | Fill #0

## 2020-08-20 MED FILL — ATORVASTATIN CALCIUM 40 MG: 40 | 30 days supply | Qty: 30 | Fill #0

## 2020-08-20 MED FILL — METOPROLOL SUCCINATE ER 25: 25 | 30 days supply | Qty: 30 | Fill #0

## 2020-08-20 MED FILL — AMIODARONE HCL 200 MG TAB: 200 | 30 days supply | Qty: 30 | Fill #0

## 2020-08-20 MED FILL — FOLIC ACID 1 MG TABS: 1 | 30 days supply | Qty: 30 | Fill #0

## 2020-08-20 MED FILL — FUROSEMIDE 20 MG TAB: 20 | 30 days supply | Qty: 60 | Fill #0

## 2020-08-20 MED FILL — SANTYL OINTMENT: 250 | 15 days supply | Qty: 30 | Fill #0

## 2020-08-20 MED FILL — ELIQUIS 2.5 MG TABLET: 2.5 | 30 days supply | Qty: 60 | Fill #0

## 2020-08-20 MED FILL — metFORMIN HCL 500 MG TABS: 500 | 30 days supply | Qty: 30 | Fill #0

## 2020-08-20 MED FILL — GABAPENTIN 600 MG TABLET: 600 | 60 days supply | Qty: 60 | Fill #0

## 2020-08-20 NOTE — Discharge Instructions (Signed)
Inpatient Rehab Discharge Instructions  [place] Discharge date and time: No discharge date for patient encounter.   Activities/Precautions/ Functional Status: Activity: activity as tolerated Diet: diabetic diet Wound Care: none needed Functional status:  ___ No restrictions     ___ Walk up steps independently ___ 24/7 supervision/assistance   ___ Walk up steps with assistance ___ Intermittent supervision/assistance  ___ Bathe/dress independently ___ Walk with walker     _x__ Bathe/dress with assistance ___ Walk Independently    ___ Shower independently ___ Walk with assistance    ___ Shower with assistance ___ No alcohol     ___ Return to work/school ________  Special Instructions: No driving smoking or alcohol   Santyl ointment applied to right foot daily  COMMUNITY REFERRALS UPON DISCHARGE:    Home Health:   PT, OT, SP, RN                 [place]  Phone: [phone]   Medical Equipment/Items Ordered:HAS FROM PREVIOUS ADMITS                                                      STROKE/TIA DISCHARGE INSTRUCTIONS SMOKING Cigarette smoking nearly doubles your risk of having a stroke & is the single most alterable risk factor  If you smoke or have smoked in the last 12 months, you are advised to quit smoking for your health.  Most of the excess cardiovascular risk related to smoking disappears within a year of stopping.  Ask you doctor about anti-smoking medications  [place] Quit Line: 1-800-QUIT NOW  Free Smoking Cessation Classes [phone]  CHOLESTEROL Know your levels; limit fat & cholesterol in your diet  Lipid Panel     Component Value Date/Time   CHOL 114 08/02/2020 0410   TRIG 58 08/02/2020 0410   HDL 41 08/02/2020 0410   CHOLHDL 2.8 08/02/2020 0410   VLDL 12 08/02/2020 0410   LDLCALC 61 08/02/2020 0410      Many patients benefit from treatment even if their cholesterol is at goal.  Goal: Total Cholesterol (CHOL) less than  160  Goal:  Triglycerides (TRIG) less than 150  Goal:  HDL greater than 40  Goal:  LDL (LDLCALC) less than 100   BLOOD PRESSURE American Stroke Association blood pressure target is less that 120/80 mm/Hg  Your discharge blood pressure is:  BP: (!) 153/93  Monitor your blood pressure  Limit your salt and alcohol intake  Many individuals will require more than one medication for high blood pressure  DIABETES (A1c is a blood sugar average for last 3 months) Goal HGBA1c is under 7% (HBGA1c is blood sugar average for last 3 months)  Diabetes: No known diagnosis of diabetes    Lab Results  Component Value Date   HGBA1C 6.2 (H) 07/28/2020     Your HGBA1c can be lowered with medications, healthy diet, and exercise.  Check your blood sugar as directed by your physician  Call your physician if you experience unexplained or low blood sugars.  PHYSICAL ACTIVITY/REHABILITATION Goal is 30 minutes at least 4 days per week  Activity: Increase activity slowly, Therapies: Physical Therapy: Home Health Return to work:   Activity decreases your risk of heart attack and stroke and makes your heart stronger.  It helps control your weight and blood pressure; helps  you relax and can improve your mood.  Participate in a regular exercise program.  Talk with your doctor about the best form of exercise for you (dancing, walking, swimming, cycling).  DIET/WEIGHT Goal is to maintain a healthy weight  Your discharge diet is:  Diet Order            DIET DYS 3 Room service appropriate? Yes; Fluid consistency: Thin  Diet effective now                 liquids Your height is:  Height: 5\' 8"  (172.7 cm) Your current weight is: Weight: 54.7 kg Your Body Mass Index (BMI) is:  BMI (Calculated): 18.34  Following the type of diet specifically designed for you will help prevent another stroke.  Your goal weight range is:    Your goal Body Mass Index (BMI) is 19-24.  Healthy food habits can help reduce 3  risk factors for stroke:  High cholesterol, hypertension, and excess weight.  RESOURCES Stroke/Support Group:  Call [phone]   STROKE EDUCATION PROVIDED/REVIEWED AND GIVEN TO PATIENT Stroke warning signs and symptoms How to activate emergency medical system (call 911). Medications prescribed at discharge. Need for follow-up after discharge. Personal risk factors for stroke. Pneumonia vaccine given:  Flu vaccine given:  My questions have been answered, the writing is legible, and I understand these instructions.  I will adhere to these goals & educational materials that have been provided to me after my discharge from the hospital.      My questions have been answered and I understand these instructions. I will adhere to these goals and the provided educational materials after my discharge from the hospital.  Patient/Caregiver Signature _______________________________ Date __________  Clinician Signature _______________________________________ Date __________  Please bring this form and your medication list with you to all your follow-up doctor's appointments.

## 2020-08-20 NOTE — Progress Notes (Signed)
Patient ID: Shawna Curry, female   DOB: 1932/12/12, 86 y.o.   MRN: [id number]  Met with pt and daughter who was in the room to discuss discharge tomorrow. Daughter wanted worker to place new PCP in chart since she has switched PCP due to not being happy with pt's previous PCP. [facility] will resume the home health services they provided prior to admission-PT,OT,SP,RN. Discussed if pt had all of the equipment she needed. Daughter reports she does and will assess if a transport chair is needed. But feels needs to be home and see if any needs first. Made aware discharge time is 10-11 daughter and son to be here in the am. Both very pleased with her progress and going home tomorrow.

## 2020-08-20 NOTE — Progress Notes (Addendum)
Occupational Therapy Discharge Summary  Patient Details  Name: Shawna Curry MRN: [id number] Date of Birth: 29-Nov-1933  Today's Date: 08/20/2020 OT Individual Time: 1100-1154 OT Individual Time Calculation (min): 54 min    Patient has met 18 of 18 long term goals due to improved activity tolerance, improved balance, postural control, ability to compensate for deficits, functional use of  RIGHT upper extremity, improved attention, improved awareness and improved coordination.  Patient to discharge at overall Supervision to min assist level.  Patient's care partner is independent to provide the necessary physical and cognitive assistance at discharge.    Skilled Intervention: Pt sitting up in w/c, c/o right toe pain "it hurts a good bit".  Pt repositioned and allowed for rest breaks as needed to reduce pain level in right foot.  Pt completed self care sitting and standing sinkside including bathing, dressing, and oral care.  Pt needed CGA during standing portions of bathing and dressing and supervision for UB bathing, dressing, and oral hygiene in sitting.  Pt required min assist to thread pants over feet when doffing and donning and needed min assist to donn socks and shoes.  Pt sitting in w/c, call bell in reach, seat belt alarm on at end of session.     Recommendation:  Patient will not require further skilled OT at this time.  Equipment: No equipment provided  Reasons for discharge: treatment goals met and discharge from hospital  Patient/family agrees with progress made and goals achieved: Yes  OT Discharge Precautions/Restrictions  Precautions Precautions: Fall Restrictions Weight Bearing Restrictions: No   Pain Pain Assessment Pain Scale: Faces Faces Pain Scale: Hurts even more Pain Type: Chronic pain Pain Location: Foot Pain Orientation: Right Pain Descriptors / Indicators: Aching Pain Onset: On-going Patients Stated Pain Goal: 0 Pain Intervention(s):  Repositioned;Rest Multiple Pain Sites: No ADL ADL Eating: Set up Where Assessed-Eating: Wheelchair Grooming: Minimal cueing, Supervision/safety Where Assessed-Grooming: Sitting at sink Upper Body Bathing: Supervision/safety Where Assessed-Upper Body Bathing: Sitting at sink, Shower Lower Body Bathing: Contact guard Where Assessed-Lower Body Bathing: Shower, Sitting at sink, Standing at sink Upper Body Dressing: Contact guard Where Assessed-Upper Body Dressing: Sitting at sink Lower Body Dressing: Minimal assistance Where Assessed-Lower Body Dressing: Sitting at sink, Standing at sink Toileting: Contact guard Where Assessed-Toileting: Glass blower/designer: Therapist, music Method: Counselling psychologist: Raised toilet seat, Energy manager: Contact guard, Minimal cueing Social research officer, government Method: Heritage manager: Grab bars, Radio broadcast assistant Vision Baseline Vision/History: Wears glasses Wears Glasses: At all times Patient Visual Report: No change from baseline Vision Assessment?: No apparent visual deficits Perception  Perception: Within Functional Limits Praxis Praxis: Intact Praxis Impairment Details: Ideomotor;Motor planning;Ideation Praxis-Other Comments: Requires extra time for completion of simple motor tasks, often benefits from multi-modal cueing for efficiency Cognition Overall Cognitive Status: Impaired/Different from baseline Arousal/Alertness: Awake/alert Orientation Level: Oriented X4 Attention: Sustained Focused Attention: Appears intact Focused Attention Impairment: Verbal basic;Functional basic Sustained Attention: Impaired Sustained Attention Impairment: Verbal basic;Functional basic Memory: Impaired Memory Impairment: Storage deficit;Retrieval deficit;Decreased recall of new information;Decreased short term memory Decreased Long Term Memory: Verbal basic;Functional basic Decreased  Short Term Memory: Verbal basic;Functional basic Awareness: Impaired Awareness Impairment: Emergent impairment Problem Solving: Impaired Problem Solving Impairment: Verbal basic;Functional basic Safety/Judgment: Impaired Sensation Sensation Light Touch: Impaired Detail Peripheral sensation comments: base line neuropathy in BLE Hot/Cold: Appears Intact Proprioception: Appears Intact Stereognosis: Not tested Coordination Gross Motor Movements are Fluid and Coordinated: No Fine Motor Movements are Fluid and Coordinated:  Yes Coordination and Movement Description: Pt needs increased time for functional mobility likely secondary to mild weakness, pain in feet, and mild standing balance impairments Motor  Motor Motor: Within Functional Limits Mobility  Transfers Sit to Stand: Contact Guard/Touching assist Stand to Sit: Contact Guard/Touching assist  Trunk/Postural Assessment  Cervical Assessment Cervical Assessment: Exceptions to [facility] (forward rounded shoulders; forward head posture) Thoracic Assessment Thoracic Assessment: Exceptions to [facility] (mild kyphosis) Lumbar Assessment Lumbar Assessment: Exceptions to [facility] (posterior pelvic tilt) Postural Control Postural Control: Deficits on evaluation Righting Reactions: WFL Protective Responses: delayed Postural Limitations: mild posterior bias  Balance Balance Balance Assessed: Yes Static Sitting Balance Static Sitting - Level of Assistance: 6: Modified independent (Device/Increase time) Dynamic Sitting Balance Dynamic Sitting - Level of Assistance: 5: Stand by assistance Static Standing Balance Static Standing - Level of Assistance: 5: Stand by assistance Dynamic Standing Balance Dynamic Standing - Level of Assistance: 5: Stand by assistance Extremity/Trunk Assessment RUE Assessment RUE Assessment: Exceptions to [facility] Passive Range of Motion (PROM) Comments: WFL Active Range of Motion (AROM) Comments: sh flexion to 45 degrees, but  pt will use L hand to get R arm "started" and then she can lift arm up General Strength Comments: Right shoulder abduction and FF  2-/5; all other planes WFL LUE Assessment LUE Assessment: Within Functional Limits   Ezekiel Slocumb 08/20/2020, 12:49 PM

## 2020-08-20 NOTE — Progress Notes (Signed)
Speech Language Pathology Discharge Summary  Patient Details  Name: Shawna Curry MRN: [id number] Date of Birth: 17-Jul-1933  Today's Date: 08/20/2020 SLP Individual Time: 0800-0857 SLP Individual Time Calculation (min): 57 min   Skilled Therapeutic Interventions: Skilled ST services focused on education and cognitive skills. Pt demonstrated fluctuating orientation to place, time and situation requiring mod A fade to min A verbal cues for use of visual aids posted in room. Pt recalled yesterday's vents when given memory notebook with mod A verbal cues decode words. SLP reassessed pt with cognitive linguistic assessment SLUMS, in which pt scored 9 out 30 (n=>27) indicating a 4 point improvement from evaluation date. SLP educated pt on continued deficits in recall impacting problem solving, error awareness and sustained attention. SLP provided memory handout sheet and provided education to continue use of memory notebook and external aids to assist memory. All questions answered to satisfaction. Pt was left in room with call bell within reach and bed alarm set.     Patient has met 5 of 5 long term goals.  Patient to discharge at [facility] level.  Reasons goals not met:     Clinical Impression/Discharge Summary:    Pt made good progress meeting 5 out 5 goals discharging at min A for basic tasks. Pt demonstrated improvements in cognitive skills during her length of stay with an increase of 4 points on formal cognitive assessment SLUMS with a score of 5 out 30 on evaluation and a score of 9 out 30 (n=>27) at discharge. Pt's primary deficit in short term recall Impact orientation, basic problem solving, emergent awareness and safety awareness. SLP facilitated use of memory notebook to aid in daily recall and orientation. Pt demonstrated increased problem solving abilities in familar function tasks verse unfamiliar tasks. Education was completed with pt's daughter earlier during length of stay and hand out  was provided to pt pertaining to memory strategies. Pt's daughter was not present for education at graduation day. Pt benefited from skilled ST services in order to increase independence and reduce burden of care, requiring 24 hour supervision and continued ST services.    Care Partner:  Caregiver Able to Provide Assistance: Yes  Type of Caregiver Assistance: Cognitive;Physical  Recommendation:  24 hour supervision/assistance;Home Health SLP  Rationale for SLP Follow Up: Reduce caregiver burden;Maximize cognitive function and independence   Equipment: N/A   Reasons for discharge: Discharged from hospital   Patient/Family Agrees with Progress Made and Goals Achieved: Yes    Shawna Curry 08/20/2020, 8:21 AM

## 2020-08-20 NOTE — Progress Notes (Signed)
[place] PHYSICAL MEDICINE & REHABILITATION PROGRESS NOTE   Subjective/Complaints: Patient seen sitting up in bed this morning.  She states she slept well overnight.  She is working with therapies.  Discussed cognition with therapies, which appears to be improving.  Patient unaware of discharge date tomorrow.  ROS: Denies CP, SOB, N/V/D  Objective:   No results found. Recent Labs    08/19/20 0618 08/20/20 0753  WBC 4.7 5.4  HGB 8.3* 9.9*  HCT 25.7* 31.4*  PLT 317 339   Recent Labs    08/19/20 0618 08/20/20 0753  NA 141 139  K 3.9 5.6*  CL 105 103  CO2 27 19*  GLUCOSE 89 130*  BUN 29* 35*  CREATININE 1.53* 1.38*  CALCIUM 9.0 9.2    Intake/Output Summary (Last 24 hours) at 08/20/2020 1247 Last data filed at 08/20/2020 0900 Gross per 24 hour  Intake 900 ml  Output --  Net 900 ml     Physical Exam: Vital Signs Blood pressure 118/72, pulse 62, temperature 97.9 F (36.6 C), resp. rate 14, height 5\' 8"  (1.727 m), weight 50.5 kg, SpO2 97 %.  Constitutional: No distress . Vital signs reviewed. HENT: Normocephalic.  Atraumatic. Eyes: EOMI. No discharge. Cardiovascular: No JVD.  RRR.  + Murmur. Respiratory: Normal effort.  No stridor.  Bilateral clear to auscultation. GI: Non-distended.  BS +. Skin: Warm and dry.  Intact. Psych: Pleasantly confused. Musc: No edema in extremities.  No tenderness in extremities. Neuro: Alert and oriented x1 after just being told by therapies Motor: RUE: 3+/5 shoulder abduction, 4-4+/5 distally LUE: 4+/5 proximal distal, unchanged  Bilateral lower extremities: 4+/5 distal, stable   Assessment/Plan: 1. Functional deficits secondary to right sided cerebral infarction which require 3+ hours per day of interdisciplinary therapy in a comprehensive inpatient rehab setting.  Physiatrist is providing close team supervision and 24 hour management of active medical problems listed below.  Physiatrist and rehab team continue to assess  barriers to discharge/monitor patient progress toward functional and medical goals  Care Tool:  Bathing    Body parts bathed by patient: Right arm, Left arm, Chest, Abdomen, Face, Right upper leg, Left upper leg, Front perineal area, Right lower leg, Left lower leg, Buttocks   Body parts bathed by helper: Right lower leg, Left lower leg, Buttocks Body parts n/a: Buttocks, Front perineal area   Bathing assist Assist Level: Contact Guard/Touching assist     Upper Body Dressing/Undressing Upper body dressing   What is the patient wearing?: Pull over shirt    Upper body assist Assist Level: Contact Guard/Touching assist    Lower Body Dressing/Undressing Lower body dressing      What is the patient wearing?: Incontinence brief, Pants     Lower body assist Assist for lower body dressing: Minimal Assistance - Patient > 75%     Toileting Toileting    Toileting assist Assist for toileting: Contact Guard/Touching assist     Transfers Chair/bed transfer  Transfers assist     Chair/bed transfer assist level: Supervision/Verbal cueing     Locomotion Ambulation   Ambulation assist   Ambulation activity did not occur: Safety/medical concerns  Assist level: Contact Guard/Touching assist Assistive device: Walker-rolling Max distance: 20'   Walk 10 feet activity   Assist  Walk 10 feet activity did not occur: Safety/medical concerns  Assist level: Contact Guard/Touching assist Assistive device: Walker-rolling   Walk 50 feet activity   Assist Walk 50 feet with 2 turns activity did not occur: Safety/medical concerns  Assist level: Contact Guard/Touching assist Assistive device: Walker-rolling    Walk 150 feet activity   Assist Walk 150 feet activity did not occur: Safety/medical concerns  Assist level: Contact Guard/Touching assist Assistive device: Walker-rolling    Walk 10 feet on uneven surface  activity   Assist Walk 10 feet on uneven surfaces  activity did not occur: Safety/medical concerns   Assist level: Contact Guard/Touching assist Assistive device: Aeronautical engineer Will patient use wheelchair at discharge?: No Type of Wheelchair: Manual    Wheelchair assist level: Maximal Assistance - Patient 25 - 49% Max wheelchair distance: 50    Wheelchair 50 feet with 2 turns activity    Assist        Assist Level: Maximal Assistance - Patient 25 - 49%   Wheelchair 150 feet activity     Assist      Assist Level: Dependent - Patient 0%    Medical Problem List and Plan: 1.  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  Antithrombotics: -DVT/anticoagulation: Intravenous heparin transitioned back to Eliquis             -antiplatelet therapy: N/A 3. Pain Management: Neurontin 300 mg twice daily and muscle rub crea. Has some right foot pain but overall well controlled.  Monitor with increased mobility 4. Mood: Provide emotional support             -antipsychotic agents: N/A 5. Neuropsych: This patient is not capable of making decisions on her own behalf. 6. Skin/Wound Care: Routine skin checks 7. Fluids/Electrolytes/Nutrition: Routine in and outs. 8.  Atrial fibrillation.  Amiodarone 200 mg twice daily, decreased to daily after discussion with pharmacy  Toprol-XL 25 mg daily. Will need outpatient f/u with cardiology.   Controlled 9/14             Monitor with increased exertion. 9.  CAD/non-STEMI.  Follow-up cardiology services 10.  Type 2 diabetes mellitus with hyperglycemia.  Hemoglobin A1c 6.2.  Patient on Glucophage 500 mg twice daily prior to admission.    Resumed            Controlled 9/14  Monitor with increased mobility. 11.  Acute on chronic systolic congestive heart failure.   Lasix 40 mg daily.  Monitor for any signs of fluid overload             Daily weights. Filed Weights   08/17/20 0500 08/19/20 0515 08/20/20 0433  Weight: 52.3 kg 50.2 kg 50.5 kg   Stable on 9/14 12.  History of CEA as well as left carotid stenosis.  Follow-up vascular surgery 13.  Hyperlipidemia: Lipitor 14.  AKI.              Creatinine 1.38 on 9/14  Nursing order placed to encourage hydration.   Encourage fluids 15.  Anemia  Hemoglobin 9.9 on 9/14  Continue to monitor 16.  Post stroke dysphagia  D3 thins, advance diet as tolerated 17.  Hyperkalemia  Potassium 5.6 on 9/14, labs ordered for tomorrow  Supplement DC'd  Lokelma x1 ordered on 9/14  LOS: 18 days A FACE TO FACE EVALUATION WAS PERFORMED  Velicia Dejager Lorie Phenix 08/20/2020, 12:47 PM

## 2020-08-20 NOTE — Progress Notes (Signed)
Inpatient Rehabilitation Care Coordinator  Discharge Note  The overall goal for the admission was met for:   Discharge location: Yes-HOME WITH CHILDREN PROVIDING 24 HR CARE  Length of Stay: Yes-19 DAYS  Discharge activity level: Yes-SUPERVISION-CGA LEVEL  Home/community participation: Yes  Services provided included: MD, RD, PT, OT, SLP, RN, CM, Pharmacy and SW  Financial Services: Private Insurance: [place]  Follow-up services arranged: Home Health: [facility]-PT,OT,RN,SP and Patient/Family request agency HH: [facility], DME: HAS ALL EQUIPMENT FROM PREVIOUS ADMITS  Comments (or additional information):BETWEEN DAUGHTER AND SON THEY PROVIDE 24/7 CARE. PT DID WELL AND REACHED HER GOALS HERE, READY FOR DC. DAUGHTER DID CHANGE PT'S PCP-DR. ASENSO APPT 9/16 9;00-11:00   Patient/Family verbalized understanding of follow-up arrangements: Yes  Individual responsible for coordination of the follow-up plan: [facility] [phone]  Confirmed correct DME delivered: Elease Hashimoto 08/20/2020    Iyonnah Ferrante, Gard[place] Rhyme

## 2020-08-20 NOTE — Discharge Summary (Signed)
Physical Therapy Discharge Summary  Patient Details  Name: Shawna Curry MRN: [id number] Date of Birth: Dec 18, 1932  Today's Date: 08/20/2020 PT Individual Time: 1320-1435 PT Individual Time Calculation (min): 75 min    Patient has met 6 of 6 long term goals due to improved activity tolerance, improved balance, improved postural control, increased strength, improved awareness and improved coordination.  Patient to discharge at an ambulatory level Supervision.   Patient's care partner is independent to provide the necessary physical and cognitive assistance at discharge.  Reasons goals not met: n/a  Recommendation:  Patient will benefit from ongoing skilled PT services in home health setting to continue to advance safe functional mobility, address ongoing impairments in balance, gait, and general strengthening in order to minimize fall risk and improve quality of life.  Equipment: No equipment provided. Pt has all needed DME  Reasons for discharge: treatment goals met  Patient/family agrees with progress made and goals achieved: Yes  PT Discharge Precautions/Restrictions Precautions Precautions: Fall Restrictions Weight Bearing Restrictions: No Vital Signs Therapy Vitals Temp: 97.9 F (36.6 C) Pulse Rate: 62 Resp: 14 BP: 118/72 Patient Position (if appropriate): Lying Oxygen Therapy SpO2: 97 % O2 Device: Room Air Pain Pain Assessment Pain Scale: 0-10 Pain Score: 5  Pain Type: Acute pain Pain Location: Toe (Comment which one) Pain Orientation: Right Pain Descriptors / Indicators: Aching Pain Frequency: Constant Pain Onset: On-going Patients Stated Pain Goal: 1 Pain Intervention(s): Medication (See eMAR) Pt has complained of R great toe pain since admission, contributed to chronic ulcers that team is aware of and has been managing via dressings and pain medications. Vision/Perception  Perception Perception: Impaired Spatial Orientation: Mild R HB  inattention Praxis Praxis: Impaired Praxis Impairment Details: Ideomotor;Motor planning;Ideation Praxis-Other Comments: Requires extra time for completion of simple motor tasks, often benefits from multi-modal cueing for efficiency  Cognition Overall Cognitive Status: Impaired/Different from baseline Arousal/Alertness: Awake/alert Orientation Level: Oriented to person (self only) Attention: Sustained;Focused Focused Attention: Impaired Focused Attention Impairment: Functional basic;Verbal basic Sustained Attention: Impaired Sustained Attention Impairment: Verbal basic;Verbal complex Memory: Impaired Memory Impairment: Storage deficit;Retrieval deficit;Decreased recall of new information;Decreased short term memory Decreased Long Term Memory: Verbal basic;Functional basic Decreased Short Term Memory: Verbal basic;Functional basic Awareness: Impaired Problem Solving: Impaired Problem Solving Impairment: Verbal basic;Functional basic Safety/Judgment: Impaired Comments: Decreased awareness to general deficits and situation Sensation Sensation Light Touch: Impaired Detail Peripheral sensation comments: base line neuropathy in BLE Hot/Cold: Appears Intact Proprioception: Appears Intact Stereognosis: Not tested Coordination Gross Motor Movements are Fluid and Coordinated: No Fine Motor Movements are Fluid and Coordinated: Yes Coordination and Movement Description: Pt needs increased time for functional mobility likely secondary to mild weakness, pain in feet, and mild standing balance impairments Motor  Motor Motor: Within Functional Limits;Abnormal postural alignment and control  Mobility Bed Mobility Bed Mobility: Rolling Right;Rolling Left;Supine to Sit;Sit to Supine Rolling Right: Supervision/verbal cueing Rolling Left: Supervision/Verbal cueing Supine to Sit: Supervision/Verbal cueing Sit to Supine: Supervision/Verbal cueing Transfers Transfers: Sit to Stand;Stand to  Sit;Squat Pivot Transfers Sit to Stand: Supervision/Verbal cueing Stand to Sit: Supervision/Verbal cueing Stand Pivot Transfers: Contact Guard/Touching assist Transfer (Assistive device): Rolling walker Locomotion  Gait Ambulation: Yes Gait Assistance: Supervision/Verbal cueing;Contact Guard/Touching assist Gait Distance (Feet): 175 Feet Assistive device: Rolling walker Gait Assistance Details: Verbal cues for sequencing;Verbal cues for safe use of DME/AE;Verbal cues for precautions/safety;Verbal cues for technique;Verbal cues for gait pattern Gait Gait: Yes Gait Pattern: Impaired Gait Pattern: Decreased step length - right;Decreased step length - left;Decreased hip/knee flexion -  left;Decreased hip/knee flexion - right;Right flexed knee in stance;Left flexed knee in stance;Narrow base of support (R foot internally rotated) Gait velocity: Grossly decreased Stairs / Additional Locomotion Stairs: Yes Stairs Assistance: Minimal Assistance - Patient > 75% Stair Management Technique: Two rails Number of Stairs: 8 Height of Stairs: 6 (inches) Ramp: Contact Guard/touching assist Wheelchair Mobility Wheelchair Mobility: No  Trunk/Postural Assessment  Cervical Assessment Cervical Assessment: Exceptions to [facility] (rounded shoulders) Thoracic Assessment Thoracic Assessment: Exceptions to [facility] (mild kyphosis) Lumbar Assessment Lumbar Assessment: Exceptions to [facility] (post pelvic tilt) Postural Control Postural Control: Deficits on evaluation Righting Reactions: WFL Protective Responses: delayed Postural Limitations: mild to moderate posterior bias  Balance Balance Balance Assessed: Yes Static Sitting Balance Static Sitting - Level of Assistance: 6: Modified independent (Device/Increase time) Dynamic Sitting Balance Dynamic Sitting - Level of Assistance: 5: Stand by assistance Static Standing Balance Static Standing - Level of Assistance: 5: Stand by assistance Dynamic Standing  Balance Dynamic Standing - Level of Assistance: 5: Stand by assistance Extremity Assessment  RUE Assessment RUE Assessment: Exceptions to [facility] Passive Range of Motion (PROM) Comments: WFL Active Range of Motion (AROM) Comments: sh flexion to 45 degrees, but pt will use L hand to get R arm "started" and then she can lift arm up General Strength Comments: Right shoulder abduction and FF  2-/5; all other planes WFL LUE Assessment LUE Assessment: Exceptions to [facility] General Strength Comments: grossly 4+/5 RLE Assessment RLE Assessment: Exceptions to [facility] General Strength Comments: grossly 4/5 LLE Assessment LLE Assessment: Exceptions to [facility] General Strength Comments: grossly 4/5  Skilled Intervention Pt received sitting in w/c, daughter at bedside, pt agreeable to PT session. Pt reports R foot pain, rest breaks and mobility provided for pain management. WC transport for time management from her room to hallway where she ambulated 126f fluctuating b/w close supervision and CGA with RW. After seated rest, navigated up/down x8 steps with minA and B HR support, therapist cueing for correct sequencing and technique throughout. Ambulated ~526fin hallway with CGA and RW to ADL room, performed stand<>sit with CGA to couch with RW and sit<>stand with minA from couch to RW where she ambulated back to her w/c in the hallway (~[id number] CGA and RW. WC transport to ortho gym where she performed car transfer with minA and RW and ambulated up/down [id number] x2 bouts with CGA and RW. After seated rest, practiced picking up a small cup from the floor with minA and RW, assist for keeping trunk upright and hinging from the hips/knees. Reinforced importance of not attempting to pick up objects from floor unassisted back home and this was relayed to her daughter who voiced understanding. Performed standing reaching with clothes pins placed above her head connected to the basketball goal net, therapist providing minA guard  with RW. Focusing on thoracic extension to promote erect posture. WC transport back to her room with totalA where she remained seated in w/c. Daughter educated on home safety training, fall prevention strategies, and role of f/u therapy services; all questions and concerns addressed. Pt left with needs in reach and daughter at bedside.  Jonus Coble P Rajat Staver PT 08/20/2020, 7:43 AM

## 2020-08-21 LAB — GLUCOSE, CAPILLARY: Glucose-Capillary: 95 mg/dL (ref 70–99)

## 2020-08-21 NOTE — Progress Notes (Signed)
[facility] PHYSICAL MEDICINE & REHABILITATION PROGRESS NOTE   Subjective/Complaints: Patient seen sitting up in bed this morning.  Daughter at bedside.  Patient states she slept well overnight.  She appears to be aware of plan for discharge today.  Daughter with questions regarding discharge medications.  ROS: Denies CP, SOB, N/V/D  Objective:   No results found. Recent Labs    08/19/20 0618 08/20/20 0753  WBC 4.7 5.4  HGB 8.3* 9.9*  HCT 25.7* 31.4*  PLT 317 339   Recent Labs    08/19/20 0618 08/20/20 0753  NA 141 139  K 3.9 5.6*  CL 105 103  CO2 27 19*  GLUCOSE 89 130*  BUN 29* 35*  CREATININE 1.53* 1.38*  CALCIUM 9.0 9.2    Intake/Output Summary (Last 24 hours) at 08/21/2020 1033 Last data filed at 08/21/2020 0814 Gross per 24 hour  Intake 630 ml  Output --  Net 630 ml     Physical Exam: Vital Signs Blood pressure 122/70, pulse 64, temperature 98.4 F (36.9 C), temperature source Oral, resp. rate 18, height 5\' 8"  (1.727 m), weight 52.9 kg, SpO2 99 %.  Constitutional: No distress . Vital signs reviewed. HENT: Normocephalic.  Atraumatic. Eyes: EOMI. No discharge. Cardiovascular: No JVD.  RRR.  + Murmur. Respiratory: Normal effort.  No stridor.  Bilateral clear to auscultation. GI: Non-distended.  BS +. Skin: Warm and dry.  Intact. Psych: Pleasantly confused. Musc: No edema in extremities.  No tenderness in extremities. Neuro: Alert Motor: RUE: 3+/5 shoulder abduction, 4-4+/5 distally, improving LUE: 4+/5 proximal distal, unchanged  Bilateral lower extremities: 4+/5 distal, stable   Assessment/Plan: 1. Functional deficits secondary to right sided cerebral infarction which require 3+ hours per day of interdisciplinary therapy in a comprehensive inpatient rehab setting.  Physiatrist is providing close team supervision and 24 hour management of active medical problems listed below.  Physiatrist and rehab team continue to assess barriers to  discharge/monitor patient progress toward functional and medical goals  Care Tool:  Bathing    Body parts bathed by patient: Right arm, Left arm, Chest, Abdomen, Face, Right upper leg, Left upper leg, Front perineal area, Right lower leg, Left lower leg, Buttocks   Body parts bathed by helper: Right lower leg, Left lower leg, Buttocks Body parts n/a: Buttocks, Front perineal area   Bathing assist Assist Level: Contact Guard/Touching assist     Upper Body Dressing/Undressing Upper body dressing   What is the patient wearing?: Pull over shirt    Upper body assist Assist Level: Contact Guard/Touching assist    Lower Body Dressing/Undressing Lower body dressing      What is the patient wearing?: Incontinence brief, Pants     Lower body assist Assist for lower body dressing: Minimal Assistance - Patient > 75%     Toileting Toileting    Toileting assist Assist for toileting: Contact Guard/Touching assist     Transfers Chair/bed transfer  Transfers assist     Chair/bed transfer assist level: Supervision/Verbal cueing     Locomotion Ambulation   Ambulation assist   Ambulation activity did not occur: Safety/medical concerns  Assist level: Supervision/Verbal cueing Assistive device: Walker-rolling Max distance: 19ft   Walk 10 feet activity   Assist  Walk 10 feet activity did not occur: Safety/medical concerns  Assist level: Supervision/Verbal cueing Assistive device: Walker-rolling   Walk 50 feet activity   Assist Walk 50 feet with 2 turns activity did not occur: Safety/medical concerns  Assist level: Contact Guard/Touching assist Assistive device:  Walker-rolling    Walk 150 feet activity   Assist Walk 150 feet activity did not occur: Safety/medical concerns  Assist level: Contact Guard/Touching assist Assistive device: Walker-rolling    Walk 10 feet on uneven surface  activity   Assist Walk 10 feet on uneven surfaces activity did not  occur: Safety/medical concerns   Assist level: Contact Guard/Touching assist Assistive device: Aeronautical engineer Will patient use wheelchair at discharge?: No Type of Wheelchair: Manual    Wheelchair assist level: Maximal Assistance - Patient 25 - 49% Max wheelchair distance: 50    Wheelchair 50 feet with 2 turns activity    Assist        Assist Level: Maximal Assistance - Patient 25 - 49%   Wheelchair 150 feet activity     Assist      Assist Level: Dependent - Patient 0%    Medical Problem List and Plan: 1.  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  Antithrombotics: -DVT/anticoagulation: Intravenous heparin transitioned back to Eliquis             -antiplatelet therapy: N/A 3. Pain Management: Neurontin 300 mg twice daily and muscle rub crea. Has some right foot pain but overall well controlled.  Monitor with increased mobility 4. Mood: Provide emotional support             -antipsychotic agents: N/A 5. Neuropsych: This patient is not capable of making decisions on her own behalf. 6. Skin/Wound Care: Routine skin checks 7. Fluids/Electrolytes/Nutrition: Routine in and outs. 8.  Atrial fibrillation.  Amiodarone 200 mg twice daily, decreased to daily after discussion with pharmacy  Toprol-XL 25 mg daily. Will need outpatient follow-up with cardiology.   Controlled 9/15             Monitor with increased exertion. 9.  CAD/non-STEMI.  Follow-up cardiology services 10.  Type 2 diabetes mellitus with hyperglycemia.  Hemoglobin A1c 6.2.  Patient on Glucophage 500 mg twice daily prior to admission.    Resumed            Controlled 9/15  Monitor with increased  mobility. 11.  Acute on chronic systolic congestive heart failure.  Lasix 40 mg daily.  Monitor for any signs of fluid overload             Daily weights. Filed Weights   08/19/20 0515 08/20/20 0433 08/21/20 0500  Weight: 50.2 kg 50.5 kg 52.9 kg   ?  Reliability on 9/15, continue to monitor ambulatory setting. 12.  History of CEA as well as left carotid stenosis.  Follow-up vascular surgery 13.  Hyperlipidemia: Lipitor 14.  AKI.              Creatinine 1.38 on 9/14, follow-up with labs as outpatient  Nursing order placed to encourage hydration.   Encourage fluids 15.  Anemia  Hemoglobin 9.9 on 9/14  Continue to monitor 16.  Post stroke dysphagia  D3 thins, advance diet as tolerated 17.  Hyperkalemia  Potassium 5.6 on 9/14, follow-up labs as outpatient  Supplement DC'd  Lokelma x1 ordered on 9/14  LOS: 19 days A FACE TO FACE EVALUATION WAS PERFORMED  Sharilyn Geisinger Lorie Phenix 08/21/2020, 10:33 AM

## 2020-08-21 NOTE — Plan of Care (Signed)
  Problem: Consults Goal: RH STROKE PATIENT EDUCATION Description: See Patient Education module for education specifics  Outcome: Completed/Met Goal: Diabetes Guidelines if Diabetic/Glucose > 140 Description: If diabetic or lab glucose is > 140 mg/dl - Initiate Diabetes/Hyperglycemia Guidelines & Document Interventions  Outcome: Completed/Met   Problem: RH BLADDER ELIMINATION Goal: RH STG MANAGE BLADDER WITH ASSISTANCE Description: STG Manage Bladder With min Assistance Outcome: Completed/Met   Problem: RH SKIN INTEGRITY Goal: RH STG ABLE TO PERFORM INCISION/WOUND CARE W/ASSISTANCE Description: STG Able To Perform Incision/Wound Care With mod Assistance. Outcome: Completed/Met   Problem: RH SAFETY Goal: RH STG ADHERE TO SAFETY PRECAUTIONS W/ASSISTANCE/DEVICE Description: STG Adhere to Safety Precautions With supervision Assistance/Device. Outcome: Completed/Met   Problem: RH COGNITION-NURSING Goal: RH STG ANTICIPATES NEEDS/CALLS FOR ASSIST W/ASSIST/CUES Description: STG Anticipates Needs/Calls for Assist With supervision Assistance/Cues. Outcome: Completed/Met   Problem: RH KNOWLEDGE DEFICIT Goal: RH STG INCREASE KNOWLEDGE OF DIABETES Description: Pt/daughter will be able to demonstrate understanding of DM management with diet control and medication compliance using handouts/booklets with supervision assist.  Outcome: Completed/Met Goal: RH STG INCREASE KNOWLEDGE OF HYPERTENSION Description: Pt/daughter will be able to demonstrate understanding of HTN management with diet control and medication compliance using handouts/booklets with supervision assist.  Outcome: Completed/Met Goal: RH STG INCREASE KNOWLEGDE OF HYPERLIPIDEMIA Description: Pt/daughter will be able to demonstrate understanding of HLD management with diet control and medication compliance using handouts/booklets with supervision assist.  Outcome: Completed/Met Goal: RH STG INCREASE KNOWLEDGE OF STROKE  PROPHYLAXIS Description: Pt/daughter will be able to demonstrate understanding of stroke prevention with diet control and medication compliance using handouts/booklets with supervision assist.  Outcome: Completed/Met

## 2020-08-21 NOTE — Progress Notes (Signed)
Patient discharged off of unit with all belongings. Discharge papers/instructions explained by physician assistant to family. Meds from transition pharmacy were brought up and delivered. Patient and family have no further questions at time of discharge. No complications noted at this time.

## 2020-08-23 ENCOUNTER — Telehealth: Payer: Self-pay | Admitting: *Deleted

## 2020-08-23 NOTE — Telephone Encounter (Signed)
Transitional call completed.  Appointment confirmed.   Address confirmed.  New patient packet mailed.  Transitional Care Questions    1. Are you/is patient experiencing any problems since coming home? Still trying to build strength, short term memory is still an issue. Are there any questions regarding any aspect of care?  No  2. Are there any questions regarding medications administration/dosing? No  Are meds being taken as prescribed? Yes  Patient should review meds with caller to confirm   3. Have there been any falls? No  4. Has Home Health been to the house and/or have they contacted you? Home health has contacted, nurse visit scheduled 1st.  If not, have you tried to contact them? Can we help you contact them?   5. Are bowels and bladder emptying properly? No  Are there any unexpected incontinence issues? No If applicable, is patient following bowel/bladder programs?   6. Any fevers, problems with breathing, unexpected pain?  No  7. Are there any skin problems or new areas of breakdown?  No  8. Has the patient/family member arranged specialty MD follow up (ie cardiology/neurology/renal/surgical/etc)? Yes, PCP did house call Can we help arrange?   9. Does the patient need any other services or support that we can help arrange? No  10. Are caregivers following through as expected in assisting the patient? Yes  11. Has the patient quit smoking, drinking alcohol, or using drugs as recommended? Patient does not do these things

## 2020-09-02 ENCOUNTER — Encounter: Payer: Self-pay | Admitting: Registered Nurse

## 2020-09-02 ENCOUNTER — Encounter: Payer: Medicare HMO | Attending: Registered Nurse | Admitting: Registered Nurse

## 2020-09-02 ENCOUNTER — Other Ambulatory Visit: Payer: Self-pay

## 2020-09-02 VITALS — BP 130/77 | HR 63 | Temp 97.7°F | Ht 68.0 in | Wt 105.0 lb

## 2020-09-02 DIAGNOSIS — I639 Cerebral infarction, unspecified: Secondary | ICD-10-CM | POA: Diagnosis present

## 2020-09-02 DIAGNOSIS — I480 Paroxysmal atrial fibrillation: Secondary | ICD-10-CM | POA: Diagnosis not present

## 2020-09-02 NOTE — Progress Notes (Signed)
Subjective:    Patient ID: Shawna Curry, female    DOB: 12-31-1932, 86 y.o.   MRN: [id number]  HPI: Shawna Curry is a 86 y.o. female who is here for Transitional Care visit of her Right Sided Cerebral Infarction and Paroxsymal Atrial Fibrillation. Ms. Shawna Curry presented to [facility] on 07/27/2020 with shortness of breath and foot pain. She arrived hypotensive and suffered PEA arrest in the ED and was  Intubated and admitted to ICU on 07/27/2020. Cardiology and Neurology was consulted.   Chest X-ray:  IMPRESSION: Enlarged cardiac silhouette. Bilateral pleural effusions in the dependent pleural space, right larger than left, and slightly larger than 2 weeks ago.  CT Chest WO Contrast:  IMPRESSION: 1. Mild posterior right basilar atelectasis and/or infiltrate. 2. Small right pleural effusion. 3. Marked severity coronary artery calcification. 4. Chronic bilateral rib fractures. 5. Aortic atherosclerosis.  Aortic Atherosclerosis (ICD10-I70.0).  DG Foot:  IMPRESSION: 1. No acute or destructive bony lesion.  CT Head WO Contrast:  IMPRESSION: Chronic small vessel disease without acute intracranial abnormality.  MRI Brain: WO Contrast IMPRESSION: 1. Motion degraded, incomplete examination. 2. Small acute posterior right cerebral infarct. 3. Mild-to-moderate chronic small vessel ischemic disease.  Ms. Shawna Curry was admitted to inpatient rehabilitation on 08/02/2020 and discharged home on 08/21/2020. She is receiving Home Health Therapy with [facility]. She denies any pain at this time. She rated her pain on Health and History 4. Also reports she has a good appetite.   Daughter in room all questions answered.    Pain Inventory Average Pain 10 Pain Right Now 4 My pain is constant, sharp, burning and aching  LOCATION OF PAIN  Foot (neuropathy)  BOWEL Number of stools per week: 2 Oral laxative use No  Type of laxative n/a Enema or suppository use No  History  of colostomy No  Incontinent No   BLADDER Normal In and out cath, frequency n/a Able to self cath n/a Bladder incontinence No  Frequent urination No  Leakage with coughing No  Difficulty starting stream No  Incomplete bladder emptying No    Mobility walk with assistance use a walker ability to climb steps?  yes do you drive?  no  Function retired  Neuro/Psych No problems in this area  Prior Studies transitional care call  Physicians involved in your care transitional care call   Family History  Problem Relation Age of Onset  . Diabetes Mother    Social History   Socioeconomic History  . Marital status: Widowed    Spouse name: Not on file  . Number of children: Not on file  . Years of education: Not on file  . Highest education level: Not on file  Occupational History  . Not on file  Tobacco Use  . Smoking status: Former Research scientist (life sciences)  . Smokeless tobacco: Never Used  Substance and Sexual Activity  . Alcohol use: Never  . Drug use: Never  . Sexual activity: Not on file  Other Topics Concern  . Not on file  Social History Narrative  . Not on file   Social Determinants of Health   Financial Resource Strain:   . Difficulty of Paying Living Expenses: Not on file  Food Insecurity:   . Worried About Charity fundraiser in the Last Year: Not on file  . Ran Out of Food in the Last Year: Not on file  Transportation Needs:   . Lack of Transportation (Medical): Not on file  . Lack of Transportation (Non-Medical):  Not on file  Physical Activity:   . Days of Exercise per Week: Not on file  . Minutes of Exercise per Session: Not on file  Stress:   . Feeling of Stress : Not on file  Social Connections:   . Frequency of Communication with Friends and Family: Not on file  . Frequency of Social Gatherings with Friends and Family: Not on file  . Attends Religious Services: Not on file  . Active Member of Clubs or Organizations: Not on file  . Attends Theatre manager Meetings: Not on file  . Marital Status: Not on file   Past Surgical History:  Procedure Laterality Date  . ABDOMINAL HYSTERECTOMY    . HAND TENDON SURGERY    . JOINT REPLACEMENT     R hip x 2  . PERCUTANEOUS PLACEMENT INTRAVASCULAR STENT CERVICAL CAROTID ARTERY    . TONSILLECTOMY     Past Medical History:  Diagnosis Date  . Atrial fibrillation ([place])   . Coronary artery disease   . Hypertension    BP 130/77   Pulse 63   Temp 97.7 F (36.5 C)   Ht 5\' 8"  (1.727 m)   Wt 105 lb (47.6 kg)   SpO2 90%   BMI 15.97 kg/m   Opioid Risk Score:   Fall Risk Score:  `1  Depression screen PHQ 2/9  No flowsheet data found.   Review of Systems  Constitutional: Negative.   HENT: Negative.   Eyes: Negative.   Respiratory: Negative.   Cardiovascular: Negative.   Gastrointestinal: Negative.   Endocrine: Negative.   Genitourinary: Negative.   Musculoskeletal: Positive for gait problem.  Skin: Negative.   Allergic/Immunologic: Negative.   Hematological: Negative.   Psychiatric/Behavioral: Negative.   All other systems reviewed and are negative.      Objective:   Physical Exam Vitals and nursing note reviewed.  Constitutional:      Appearance: Normal appearance.  Cardiovascular:     Rate and Rhythm: Normal rate and regular rhythm.     Pulses: Normal pulses.     Heart sounds: Normal heart sounds.  Pulmonary:     Effort: Pulmonary effort is normal.     Breath sounds: Normal breath sounds.  Musculoskeletal:     Cervical back: Normal range of motion and neck supple.     Comments: Normal Muscle Bulk and Muscle Testing Reveals:  Upper Extremities: Right: Decreased ROM 30 Degrees  and Muscle Strength 4/5  Left Upper Extremity: Full ROM and Muscle Strength 5/5  Lower Extremities: Full ROM and Muscle Strength 5/5 Arises from Table Slowly using walker for support Narrow Based Gait   Skin:    General: Skin is warm and dry.  Neurological:     Mental Status: She is  alert and oriented to person, place, and time.  Psychiatric:        Mood and Affect: Mood normal.        Behavior: Behavior normal.           Assessment & Plan:   1. Right Sided Cerebral Infarction: Daughter reports she has a Garment/textile technologist at [facility] and she was instructed to call to schedule HFU appointment, she verbalizes understanding.  2.Paroxsymal Atrial Fibrillation. Continue current medication regimen. She has an appointment with cardiology.   20 minutes of face to face patient care time was spent during this visit. All questions were encouraged and answered.  F/U in 4- 6 weeks with Dr Posey Pronto

## 2020-10-07 ENCOUNTER — Encounter: Payer: Medicare HMO | Attending: Registered Nurse | Admitting: Physical Medicine & Rehabilitation

## 2020-10-07 DIAGNOSIS — I480 Paroxysmal atrial fibrillation: Secondary | ICD-10-CM | POA: Insufficient documentation

## 2020-10-07 DIAGNOSIS — I639 Cerebral infarction, unspecified: Secondary | ICD-10-CM | POA: Insufficient documentation

## 2022-01-26 IMAGING — DX DG CHEST 1V PORT
1 series · 1 of 1 positions shown · non-contrast
Comparison: 07/27/2020 at [DATE] p.m.

CLINICAL DATA: Intubated, history of cardiac event

EXAM:
PORTABLE CHEST 1 VIEW

[chest ap]
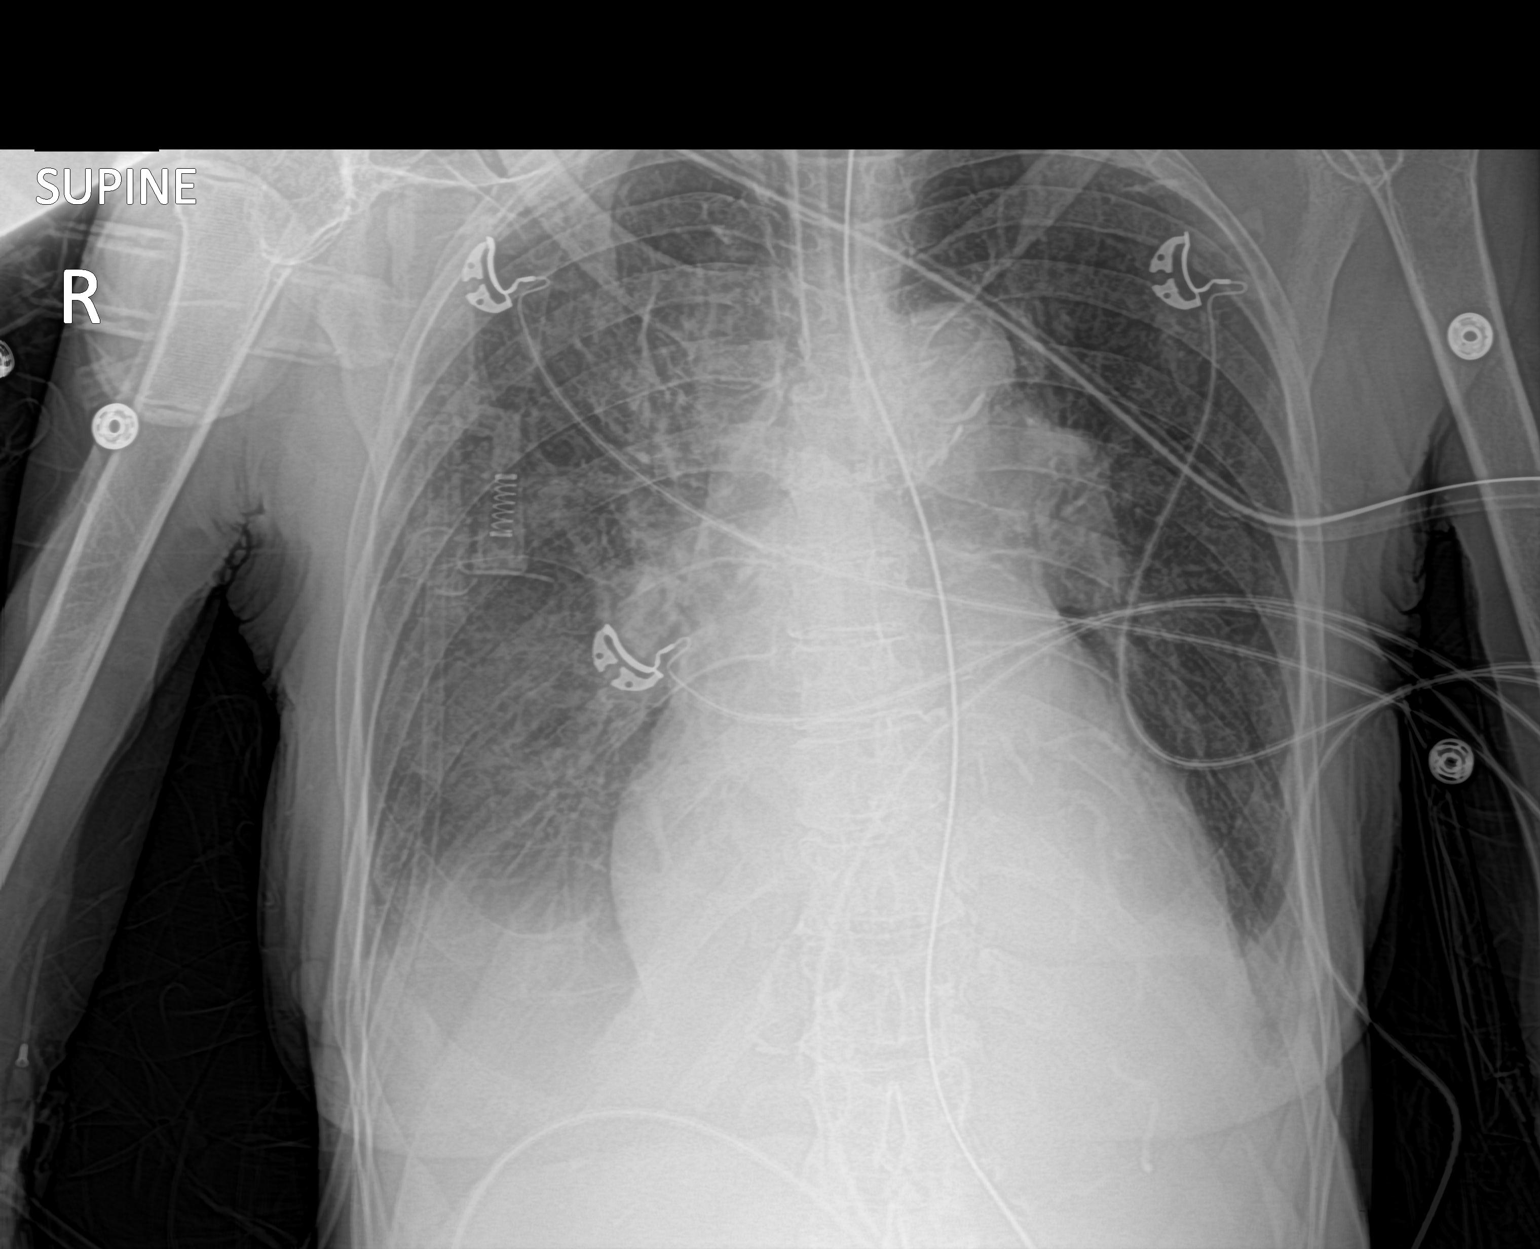

[1 of 1 positions shown; findings below may reference images not displayed]

FINDINGS: Supine frontal view of the chest was obtained at [DATE] p.m.
Endotracheal tube overlies tracheal air column tip at level of
thoracic inlet. Enteric catheter passes below diaphragm tip excluded
by collimation. Cardiac silhouette is enlarged. There are developing
bibasilar veiling opacities, right greater than left. Stable central
vascular congestion. No pneumothorax.
IMPRESSION: 1. Support devices as above.
2. Bibasilar lung consolidation and effusions, right greater than
left. This has progressed since prior study.
3. Stable central vascular congestion.

## 2022-01-26 IMAGING — DX DG CHEST 1V PORT
1 series · 1 of 1 positions shown · non-contrast
Comparison: 07/27/2020 at [DATE] p.m.

CLINICAL DATA: Intubated after cardiac event, unresponsive

EXAM:
PORTABLE CHEST 1 VIEW

[chest ap]
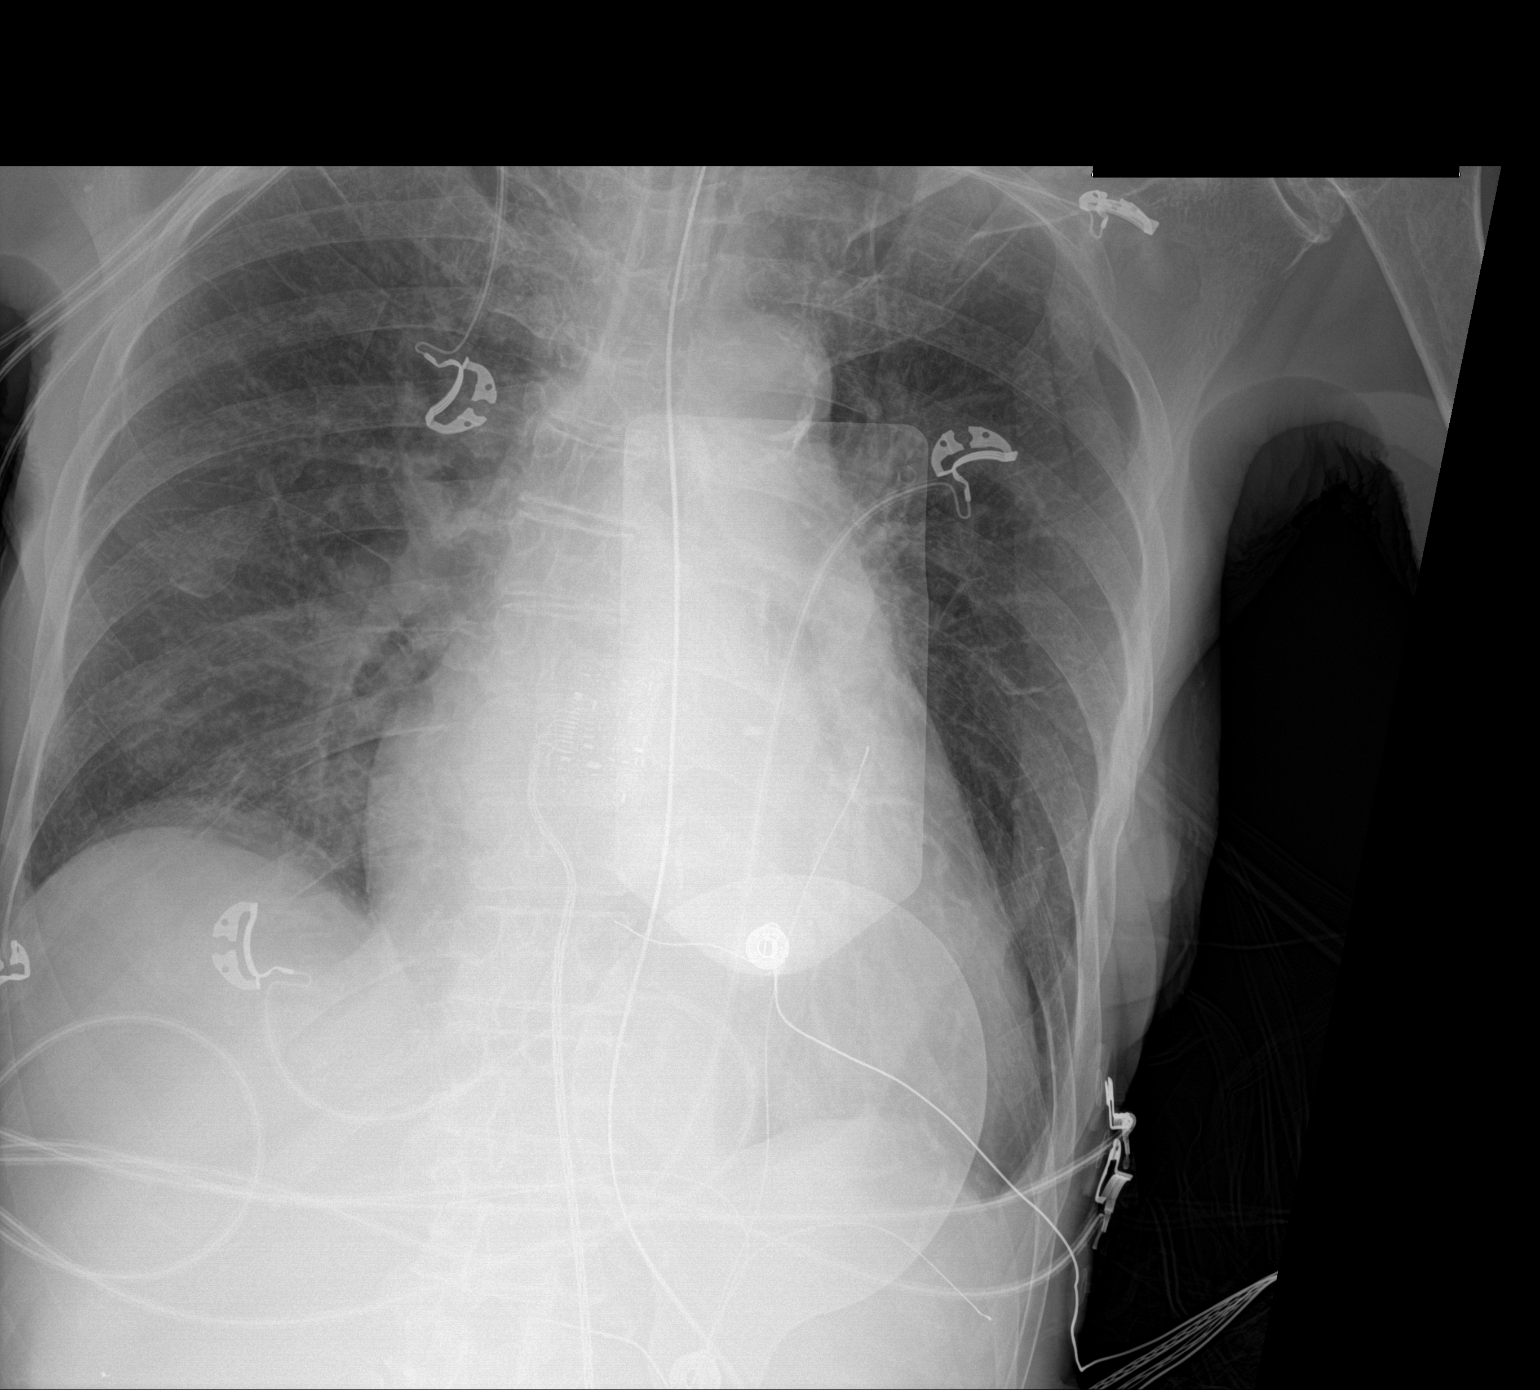

[1 of 1 positions shown; findings below may reference images not displayed]

FINDINGS: Single frontal view of the chest demonstrates endotracheal tube
overlying tracheal air column tip just below thoracic inlet. Enteric
catheter passes below diaphragm tip excluded by collimation.
External defibrillator pads overlie the cardiac silhouette, which is
enlarged. Trace left pleural effusion is unchanged. There is central
vascular congestion. No pneumothorax. No acute bony abnormalities.
IMPRESSION: 1. Support devices as above.
2. Central vascular congestion, with stable trace left pleural
effusion.

## 2022-01-27 IMAGING — CT CT HEAD W/O CM
4 series · 17 of 47 positions shown, 19 images · non-contrast
Comparison: 02/18/2020

CLINICAL DATA: Encephalopathy

EXAM:
CT HEAD WITHOUT CONTRAST
TECHNIQUE: Contiguous axial images were obtained from the base of the skull
through the vertex without intravenous contrast.

[Series 3: head wo · axial · 0.47mm/px · z∈[+1030,+1150]mm · 7 of 34 slices shown, 9 images]
[im 5/34  brain]
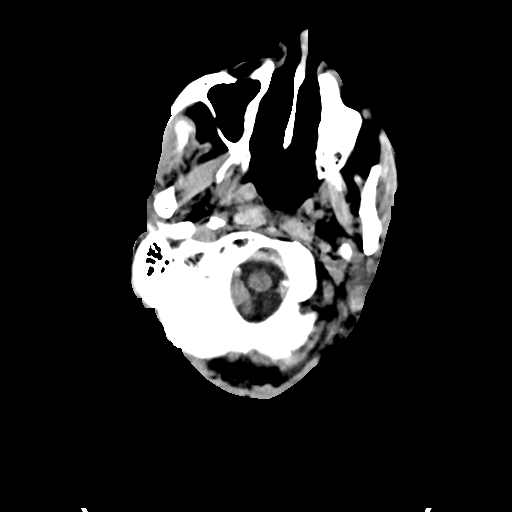
[im 5/34  bone]
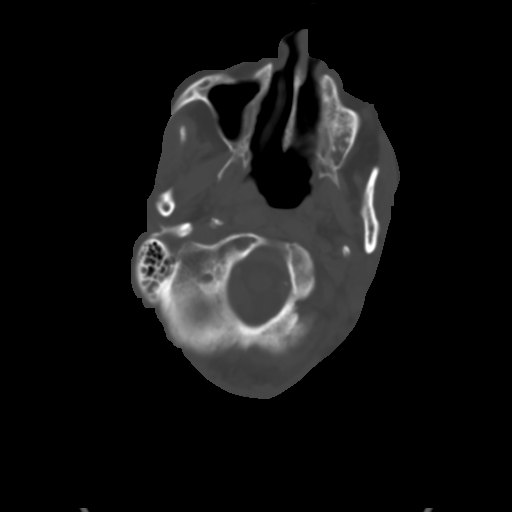
[im 9/34  brain]
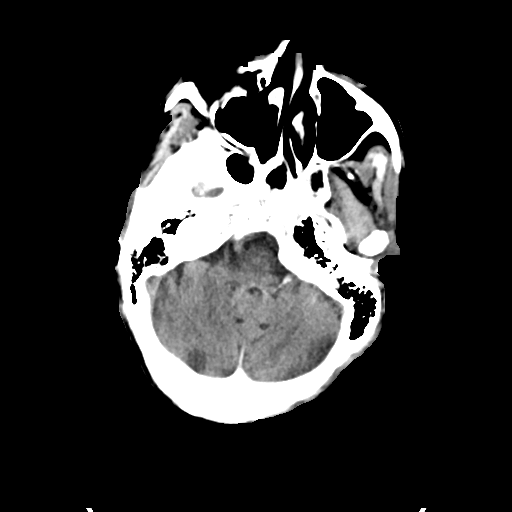
[im 13/34  brain]
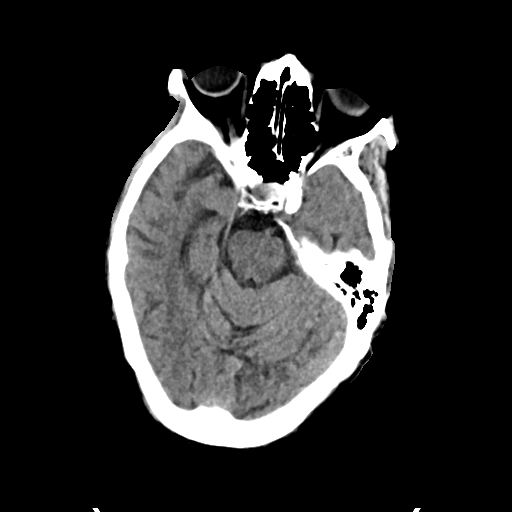
[im 17/34  brain]
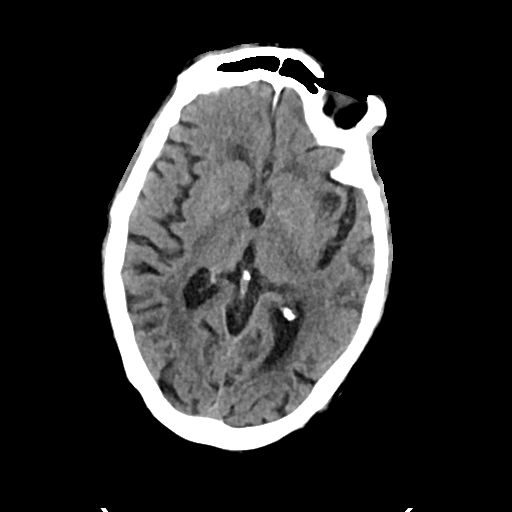
[im 21/34  brain]
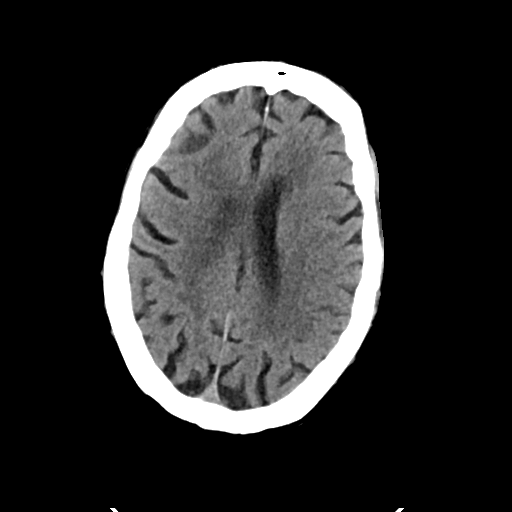
[im 21/34  bone]
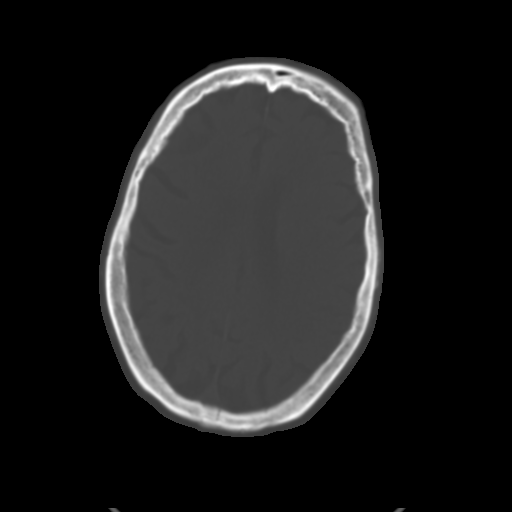
[im 25/34  brain]
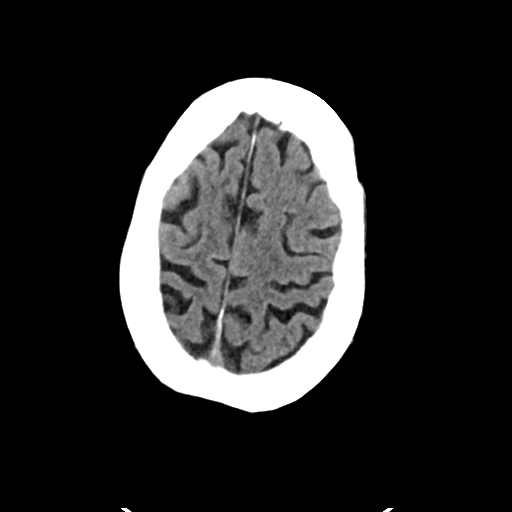
[im 29/34  brain]
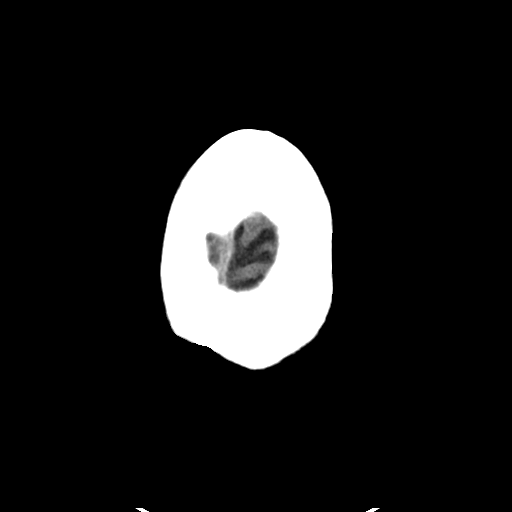

[Series 4: head bone · axial · 0.47mm/px · z∈[+1026,+1084]mm · 4 of 85 slices shown]
[im 9/85  bone]
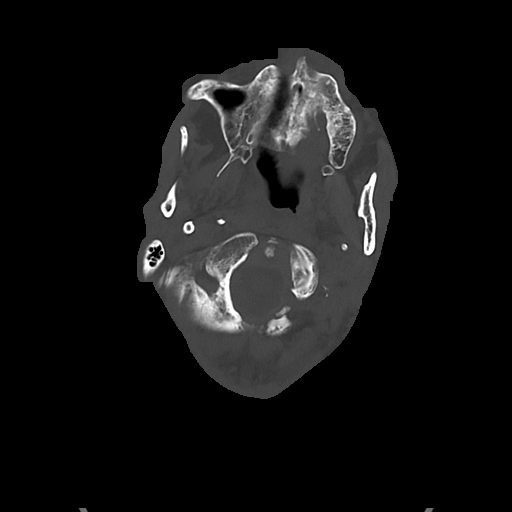
[im 17/85  bone]
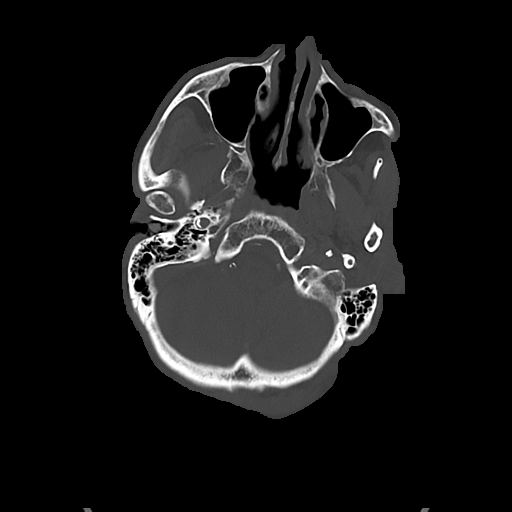
[im 26/85  bone]
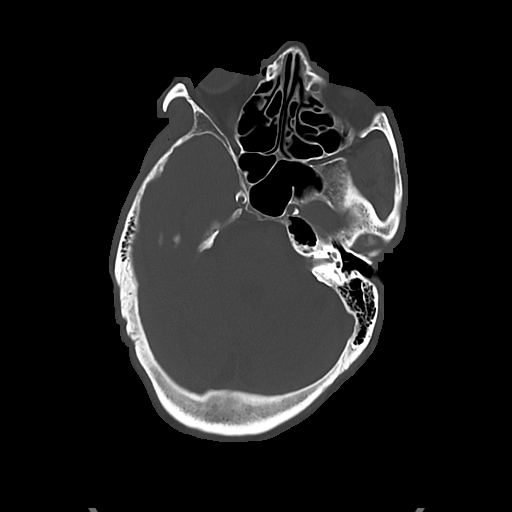
[im 38/85  bone]
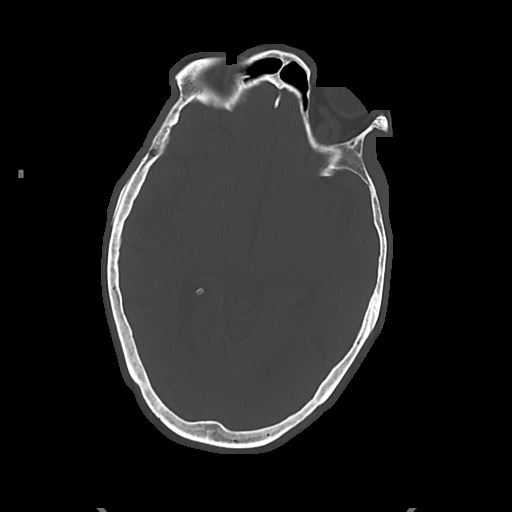

[Series 5: cor soft · coronal · 0.39mm/px · 3 of 84 slices shown]
[im 28/84  brain]
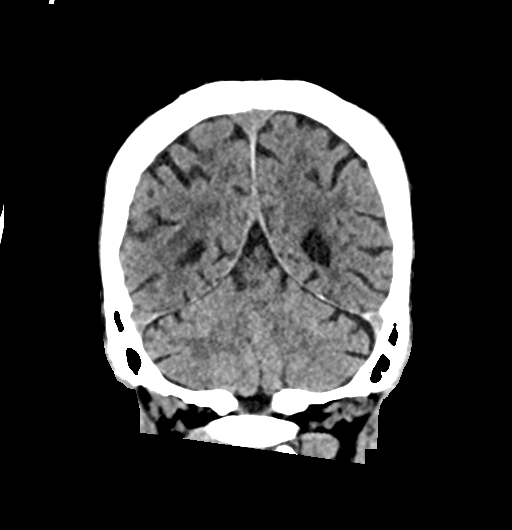
[im 37/84  brain]
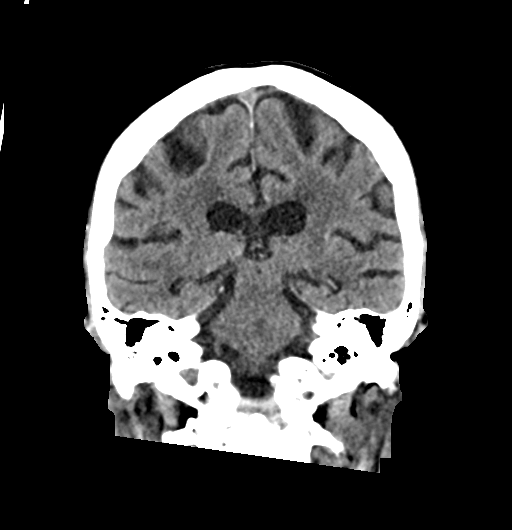
[im 47/84  brain]
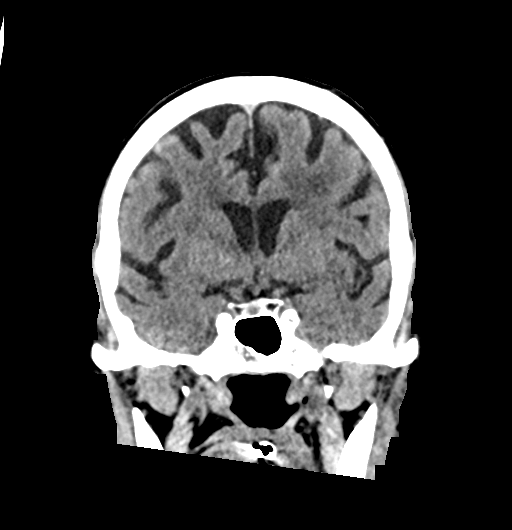

[Series 6: sag soft · sagittal · 0.43mm/px · 3 of 51 slices shown]
[im 18/51  brain]
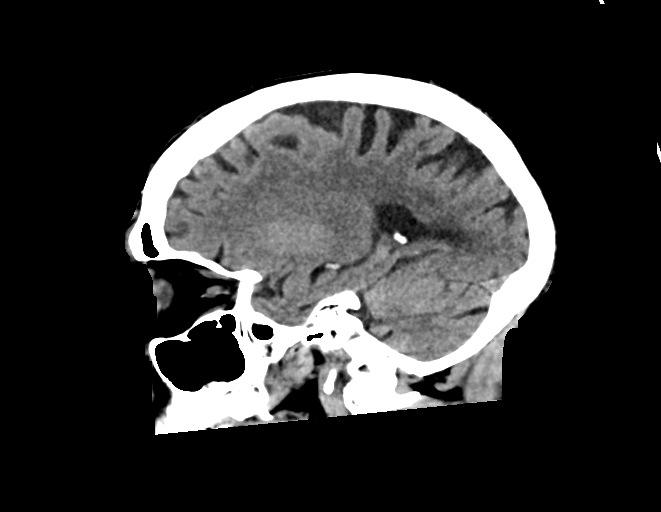
[im 26/51  brain]
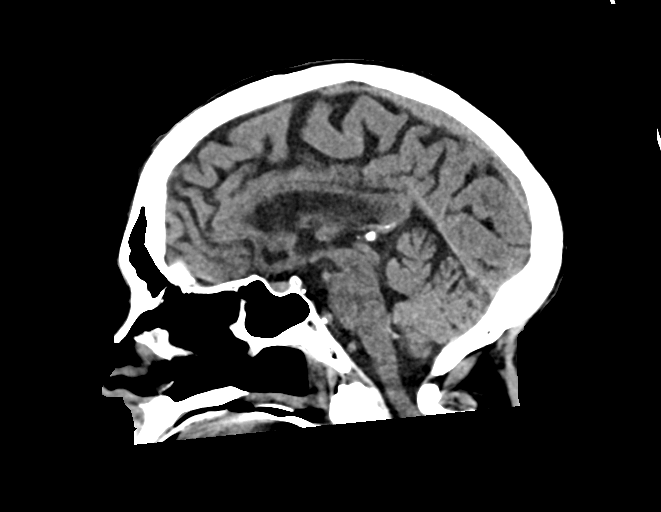
[im 34/51  brain]
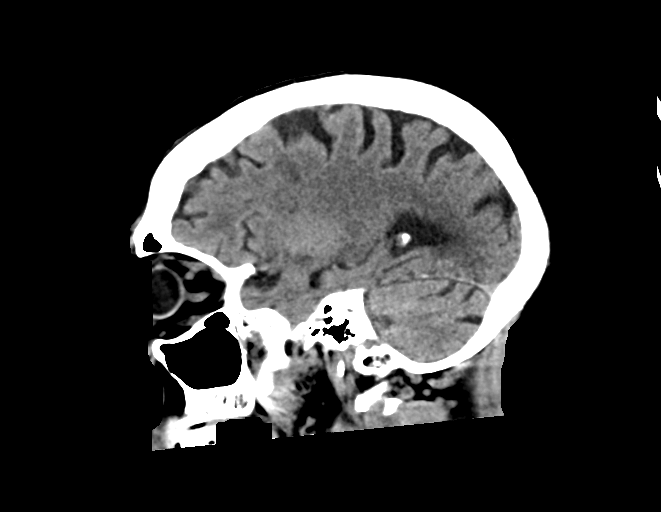

[17 of 47 positions shown; findings below may reference images not displayed]

FINDINGS: Brain: There is no mass, hemorrhage or extra-axial collection. The
size and configuration of the ventricles and extra-axial CSF spaces
are normal. There is hypoattenuation of the white matter, most
commonly indicating chronic small vessel disease.

Vascular: No abnormal hyperdensity of the major intracranial
arteries or dural venous sinuses. No intracranial atherosclerosis.

Skull: The visualized skull base, calvarium and extracranial soft
tissues are normal.

Sinuses/Orbits: No fluid levels or advanced mucosal thickening of
the visualized paranasal sinuses. No mastoid or middle ear effusion.
The orbits are normal.
IMPRESSION: Chronic small vessel disease without acute intracranial abnormality.
